# Patient Record
Sex: Female | Born: 1980
Health system: Southern US, Community
[De-identification: ages and names within clinical notes are randomized; demographics above are authoritative.]

## PROBLEM LIST (undated history)

## (undated) VITALS — BP 104/66 | HR 67 | Temp 99.0°F | Resp 16 | Ht 64.0 in | Wt 141.0 lb

## (undated) DIAGNOSIS — IMO0002 Reserved for concepts with insufficient information to code with codable children: Secondary | ICD-10-CM

## (undated) DIAGNOSIS — R011 Cardiac murmur, unspecified: Secondary | ICD-10-CM

## (undated) DIAGNOSIS — R87612 Low grade squamous intraepithelial lesion on cytologic smear of cervix (LGSIL): Secondary | ICD-10-CM

## (undated) DIAGNOSIS — A6000 Herpesviral infection of urogenital system, unspecified: Secondary | ICD-10-CM

## (undated) DIAGNOSIS — T7840XA Allergy, unspecified, initial encounter: Secondary | ICD-10-CM

## (undated) DIAGNOSIS — F419 Anxiety disorder, unspecified: Secondary | ICD-10-CM

## (undated) DIAGNOSIS — F329 Major depressive disorder, single episode, unspecified: Secondary | ICD-10-CM

## (undated) DIAGNOSIS — D259 Leiomyoma of uterus, unspecified: Secondary | ICD-10-CM

## (undated) DIAGNOSIS — F32A Depression, unspecified: Secondary | ICD-10-CM

## (undated) HISTORY — DX: Allergy, unspecified, initial encounter: T78.40XA

## (undated) HISTORY — DX: Herpesviral infection of urogenital system, unspecified: A60.00

## (undated) HISTORY — DX: Low grade squamous intraepithelial lesion on cytologic smear of cervix (LGSIL): R87.612

## (undated) HISTORY — DX: Leiomyoma of uterus, unspecified: D25.9

## (undated) HISTORY — DX: Cardiac murmur, unspecified: R01.1

## (undated) HISTORY — DX: Reserved for concepts with insufficient information to code with codable children: IMO0002

---

## 2007-05-14 HISTORY — PX: OTHER SURGICAL HISTORY: SHX169

## 2011-08-20 ENCOUNTER — Ambulatory Visit (INDEPENDENT_AMBULATORY_CARE_PROVIDER_SITE_OTHER): Payer: 59 | Admitting: Physician Assistant

## 2011-08-20 VITALS — BP 116/76 | HR 67 | Temp 98.6°F | Resp 16 | Ht 64.5 in | Wt 147.2 lb

## 2011-08-20 DIAGNOSIS — R609 Edema, unspecified: Secondary | ICD-10-CM

## 2011-08-20 DIAGNOSIS — R739 Hyperglycemia, unspecified: Secondary | ICD-10-CM

## 2011-08-20 DIAGNOSIS — Z0000 Encounter for general adult medical examination without abnormal findings: Secondary | ICD-10-CM

## 2011-08-20 DIAGNOSIS — R600 Localized edema: Secondary | ICD-10-CM

## 2011-08-20 DIAGNOSIS — R42 Dizziness and giddiness: Secondary | ICD-10-CM

## 2011-08-20 LAB — POCT URINALYSIS DIPSTICK
Bilirubin, UA: NEGATIVE
Blood, UA: NEGATIVE
Nitrite, UA: NEGATIVE
Spec Grav, UA: 1.02
pH, UA: 7.5

## 2011-08-20 LAB — POCT CBC
HCT, POC: 36.4 % — AB (ref 37.7–47.9)
Hemoglobin: 12.5 g/dL (ref 12.2–16.2)
Lymph, poc: 2.2 (ref 0.6–3.4)
MCHC: 34.3 g/dL (ref 31.8–35.4)
MCV: 97.7 fL — AB (ref 80–97)
POC LYMPH PERCENT: 41.8 %L (ref 10–50)
RDW, POC: 13 %

## 2011-08-20 LAB — POCT UA - MICROSCOPIC ONLY
Casts, Ur, LPF, POC: NEGATIVE
Mucus, UA: NEGATIVE
Yeast, UA: NEGATIVE

## 2011-08-20 LAB — COMPREHENSIVE METABOLIC PANEL
Albumin: 4.2 g/dL (ref 3.5–5.2)
Alkaline Phosphatase: 51 U/L (ref 39–117)
BUN: 10 mg/dL (ref 6–23)
CO2: 25 mEq/L (ref 19–32)
Calcium: 9.1 mg/dL (ref 8.4–10.5)
Glucose, Bld: 109 mg/dL — ABNORMAL HIGH (ref 70–99)
Potassium: 3.9 mEq/L (ref 3.5–5.3)
Sodium: 139 mEq/L (ref 135–145)
Total Protein: 6.5 g/dL (ref 6.0–8.3)

## 2011-08-20 LAB — POCT GLYCOSYLATED HEMOGLOBIN (HGB A1C): Hemoglobin A1C: 5.5

## 2011-08-20 NOTE — Progress Notes (Signed)
Subjective:    Patient ID: Nicole Wallace, female    DOB: [birth year], 31 y.o.   MRN: [id number]  HPI Presents with 5 days of intermittent dizziness x 5 days.  Occurs primarily with rapid position change, and is brief.  No spinning.  No associated visual changes, nausea, loss of sensation.  No syncope.  No GU/GI symptoms, no URI-type symptoms.  No recent illness.  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    Recently moved to [place] from [place].  Her father is here.  Her husband of 3 years remains in [place] where he has several children from a previous relationship.  There is significant strain on the marriage. However, the anxiety and stress she was experiencing in [place] have resolved.  She no longer need citalopram and has had a reduction in the frequency of her migraine HA.     Review of Systems As above.    Objective:   Physical Exam  Vital signs noted. No orthostasis. Well-developed, well nourished BF who is awake, alert and oriented, in NAD. HEENT: [place]/AT, PERRL, EOMI.  Sclera and conjunctiva are clear.  Funduscopic exam is normal. EAC are patent, TMs are normal in appearance. Nasal mucosa is pink and moist. OP is clear. Neck: supple, non-tender, no lymphadenopathey, thyromegaly. Heart: RRR, no murmur Lungs: CTA Extremities: no cyanosis, clubbing or edema. Skin: warm and dry without rash.  Results for orders placed in visit on 08/20/11  POCT CBC      Component Value Range   WBC 5.3  4.6 - 10.2 (K/uL)   Lymph, poc 2.2  0.6 - 3.4    POC LYMPH PERCENT 41.8  10 - 50 (%L)   MID (cbc) 0.4  0 - 0.9    POC MID % 7.6  0 - 12 (%M)   POC Granulocyte 2.7  2 - 6.9    Granulocyte percent 50.6  37 - 80 (%G)   RBC 3.73 (*) 4.04 - 5.48 (M/uL)   Hemoglobin 12.5  12.2 - 16.2 (g/dL)   HCT, POC 40.9 (*) 81.1 - 47.9 (%)   MCV 97.7 (*) 80 - 97 (fL)   MCH, POC 33.5 (*) 27 - 31.2 (pg)   MCHC 34.3  31.8 -  35.4 (g/dL)   RDW, POC 91.4     Platelet Count, POC 244  142 - 424 (K/uL)   MPV 9.9  0 - 99.8 (fL)  GLUCOSE, POCT (MANUAL RESULT ENTRY)      Component Value Range   POC Glucose 110    POCT UA - MICROSCOPIC ONLY      Component Value Range   WBC, Ur, HPF, POC 1-3     RBC, urine, microscopic 0-1     Bacteria, U Microscopic trace     Mucus, UA neg     Epithelial cells, urine per micros 0-1     Crystals, Ur, HPF, POC neg     Casts, Ur, LPF, POC neg     Yeast, UA neg    POCT URINALYSIS DIPSTICK      Component Value Range   Color, UA yellow     Clarity, UA clear     Glucose, UA neg     Bilirubin, UA neg     Ketones, UA trace     Spec Grav, UA 1.020     Blood, UA neg     pH, UA  7.5     Protein, UA trace     Urobilinogen, UA 1.0     Nitrite, UA neg     Leukocytes, UA Negative    POCT GLYCOSYLATED HEMOGLOBIN (HGB A1C)      Component Value Range   Hemoglobin A1C 5.5          Assessment & Plan:   1. Dizziness - light-headed  POCT CBC, POCT glucose (manual entry), POCT UA - Microscopic Only, POCT urinalysis dipstick, TSH, Comprehensive metabolic panel; suspect marital stress is ultimate cause.  Name and number for [facility] provided for counselling.  2. Leg edema  POCT UA - Microscopic Only, POCT urinalysis dipstick, TSH  3. Routine health maintenance  Ambulatory referral to Obstetrics / Gynecology  4. Hyperglycemia  HgB A1c. No evidence of diabetes, patient is non-fasting.

## 2011-08-20 NOTE — Patient Instructions (Signed)
Low impact exercise can help, but please take it easy while we're waiting on the remaining lab results.

## 2011-08-22 MED ORDER — CITALOPRAM HYDROBROMIDE 10 MG PO TABS: 10.0000 mg | ORAL_TABLET | Freq: Every day | ORAL | Status: DC

## 2011-08-22 NOTE — Progress Notes (Signed)
Addended by: Fernande Bras on: 08/22/2011 01:43 PM   Modules accepted: Orders

## 2011-09-03 ENCOUNTER — Other Ambulatory Visit: Payer: Self-pay | Admitting: Physician Assistant

## 2011-09-03 ENCOUNTER — Encounter: Payer: Self-pay | Admitting: Physician Assistant

## 2011-09-03 MED ORDER — FLUCONAZOLE 150 MG PO TABS: 150.0000 mg | ORAL_TABLET | Freq: Once | ORAL | Status: AC

## 2012-01-30 ENCOUNTER — Telehealth: Payer: Self-pay

## 2012-01-30 MED ORDER — TRAMADOL HCL 50 MG PO TABS: 50.0000 mg | ORAL_TABLET | Freq: Four times a day (QID) | ORAL | Status: DC | PRN

## 2012-01-30 MED ORDER — NORGESTIMATE-ETH ESTRADIOL 0.25-35 MG-MCG PO TABS: 1.0000 | ORAL_TABLET | Freq: Every day | ORAL | Status: DC

## 2012-01-30 NOTE — Telephone Encounter (Signed)
Nicole Wallace is requesting RFs on her BCPs and Tramadol (that she takes as needed for her migraines). She hadn't needed these at OV w/[name] so it was not done then. [name], do you want to RF these?

## 2012-01-30 NOTE — Telephone Encounter (Signed)
I've refilled both. I referred her to OBGYN when I saw her, so please remind her to have her exam in the next few months!!!

## 2012-01-31 NOTE — Telephone Encounter (Signed)
I called patient to advise.  

## 2012-04-13 ENCOUNTER — Ambulatory Visit (INDEPENDENT_AMBULATORY_CARE_PROVIDER_SITE_OTHER): Payer: 59 | Admitting: Physician Assistant

## 2012-04-13 VITALS — BP 128/86 | HR 82 | Temp 98.8°F | Resp 16 | Ht 64.5 in | Wt 156.0 lb

## 2012-04-13 DIAGNOSIS — G43909 Migraine, unspecified, not intractable, without status migrainosus: Secondary | ICD-10-CM | POA: Insufficient documentation

## 2012-04-13 DIAGNOSIS — F419 Anxiety disorder, unspecified: Secondary | ICD-10-CM | POA: Insufficient documentation

## 2012-04-13 DIAGNOSIS — D219 Benign neoplasm of connective and other soft tissue, unspecified: Secondary | ICD-10-CM | POA: Insufficient documentation

## 2012-04-13 DIAGNOSIS — J029 Acute pharyngitis, unspecified: Secondary | ICD-10-CM

## 2012-04-13 LAB — POCT RAPID STREP A (OFFICE): Rapid Strep A Screen: NEGATIVE

## 2012-04-13 MED ORDER — GUAIFENESIN ER 1200 MG PO TB12: 1.0000 | ORAL_TABLET | Freq: Two times a day (BID) | ORAL | Status: DC | PRN

## 2012-04-13 MED ORDER — IPRATROPIUM BROMIDE 0.03 % NA SOLN: 2.0000 | Freq: Two times a day (BID) | NASAL | Status: DC

## 2012-04-13 MED ORDER — FIRST-DUKES MOUTHWASH MT SUSP: 5.0000 mL | OROMUCOSAL | Status: DC | PRN

## 2012-04-13 NOTE — Patient Instructions (Signed)
Get plenty of rest and drink at least 64 ounces of water daily. 

## 2012-04-13 NOTE — Progress Notes (Signed)
Subjective:    Patient ID: Nicole Wallace, female    DOB: [birth year], 31 y.o.   MRN: [id number]  HPI This 31 y.o. female presents for evaluation of illness.  Awoke several days ago with throat pain.  Worse with swallowing, sneezing.  Worse with hot food/drink.  Feels like something stuck in her throat.  Took a photo of her throat, looks like some "red stuff."  Uvula is swollen. Minimal cough.  No nasal congestion.   Has begun to develop left ear pain. Subjective chills. No nausea, diarrhea.  Feels "Exhausted."  Her employer told her not to work today due to her illness.  Past Medical History  Diagnosis Date  . Anemia   . Migraine   . Fibroid uterus   . LSIL (low grade squamous intraepithelial lesion) on Pap smear 02/2010    colposcopy 03/2010, +HPV; normal pap 04/15/2011  . Duodenitis     mild    History reviewed. No pertinent past surgical history.  Prior to Admission medications   Medication Sig Start Date End Date Taking? Authorizing Provider  citalopram (CELEXA) 10 MG tablet Take 1 tablet (10 mg total) by mouth daily. 08/22/11  Yes Nicole Herbig S Kiely Cousar, PA-C  norgestimate-ethinyl estradiol (ORTHO-CYCLEN,SPRINTEC,PREVIFEM) 0.25-35 MG-MCG tablet Take 1 tablet by mouth daily. 01/30/12  Yes Nicole Cerino S Guerry Covington, PA-C  traMADol (ULTRAM) 50 MG tablet Take 1 tablet (50 mg total) by mouth every 6 (six) hours as needed. 01/30/12  Yes Nicole Norsworthy S Varshini Arrants, PA-C    No Known Allergies  History   Social History  . Marital Status: Married    Spouse Name: Nicole Wallace    Number of Children: 0  . Years of Education: 15   Occupational History  . Precertification Coordinator      GI   Social History Main Topics  . Smoking status: Never Smoker   . Smokeless tobacco: Never Used  . Alcohol Use: 0.6 oz/week    1 Glasses of wine per week  . Drug Use: No  . Sexually Active: No   Other Topics Concern  . Not on file   Social History Narrative   Separated from husband since 06/2011  (he lives in [place], [place]).  She lives with her father here in [place].    Family History  Problem Relation Age of Onset  . Asthma Mother   . Kidney disease Father     benign renal tumor, s/p nephrectomy  . Hypertension Father   . Hypertension Sister     cardiomegaly  . Hyperlipidemia Sister   . Asthma Sister   . Scoliosis Sister   . Fibromyalgia Sister     Review of Systems As above.    Objective:   Physical Exam Blood pressure 128/86, pulse 82, temperature 98.8 F (37.1 C), temperature source Oral, resp. rate 16, height 5' 4.5" (1.638 m), weight 156 lb (70.761 kg), last menstrual period 04/07/2012. Body mass index is 26.36 kg/(m^2). Well-developed, well nourished BF who is awake, alert and oriented, in NAD. HEENT: [place]/AT, PERRL, EOMI.  Sclera and conjunctiva are clear.  EAC are patent, TMs are normal in appearance. Nasal mucosa is congested, pink and moist. OP with minimal uvula edema, mild erythema of the anterior columns, otherwise clear. Neck: supple, non-tender, no lymphadenopathy, thyromegaly. Heart: RRR, no murmur Lungs: normal effort, CTA Extremities: no cyanosis, clubbing or edema. Skin: warm and dry without rash. Psychologic: good mood and appropriate affect, normal speech and behavior.  Results for orders placed in visit on 04/13/12  POCT  RAPID STREP A (OFFICE)      Component Value Range   Rapid Strep A Screen Negative  Negative      Assessment & Plan:   1. Acute pharyngitis  POCT rapid strep A, Culture, Group A Strep, ipratropium (ATROVENT) 0.03 % nasal spray, Guaifenesin (MUCINEX MAXIMUM STRENGTH) 1200 MG TB12, Diphenhyd-Hydrocort-Nystatin (FIRST-DUKES MOUTHWASH) SUSP   Supportive care, RTC if symptoms worsen/persist.

## 2012-04-15 LAB — CULTURE, GROUP A STREP: Organism ID, Bacteria: NORMAL

## 2012-07-16 ENCOUNTER — Ambulatory Visit (INDEPENDENT_AMBULATORY_CARE_PROVIDER_SITE_OTHER): Payer: 59 | Admitting: Physician Assistant

## 2012-07-16 ENCOUNTER — Encounter: Payer: Self-pay | Admitting: Physician Assistant

## 2012-07-16 VITALS — BP 102/70 | HR 66 | Temp 98.8°F | Resp 16 | Ht 64.5 in | Wt 151.0 lb

## 2012-07-16 LAB — CBC WITH DIFFERENTIAL/PLATELET
Basophils Absolute: 0 10*3/uL (ref 0.0–0.1)
Basophils Relative: 1 % (ref 0–1)
Eosinophils Relative: 2 % (ref 0–5)
Lymphocytes Relative: 50 % — ABNORMAL HIGH (ref 12–46)
MCHC: 34.9 g/dL (ref 30.0–36.0)
MCV: 94.5 fL (ref 78.0–100.0)
Platelets: 247 10*3/uL (ref 150–400)
RDW: 12.5 % (ref 11.5–15.5)
WBC: 4.6 10*3/uL (ref 4.0–10.5)

## 2012-07-16 LAB — COMPREHENSIVE METABOLIC PANEL
ALT: 18 U/L (ref 0–35)
AST: 22 U/L (ref 0–37)
Alkaline Phosphatase: 39 U/L (ref 39–117)
BUN: 5 mg/dL — ABNORMAL LOW (ref 6–23)
Calcium: 9.8 mg/dL (ref 8.4–10.5)
Chloride: 103 mEq/L (ref 96–112)
Creat: 0.66 mg/dL (ref 0.50–1.10)
Total Bilirubin: 0.5 mg/dL (ref 0.3–1.2)

## 2012-07-16 LAB — POCT URINALYSIS DIPSTICK
Glucose, UA: NEGATIVE
Leukocytes, UA: NEGATIVE
Nitrite, UA: NEGATIVE
Protein, UA: NEGATIVE
Urobilinogen, UA: 0.2

## 2012-07-16 LAB — LIPID PANEL
HDL: 63 mg/dL (ref >39)
LDL Cholesterol: 105 mg/dL — ABNORMAL HIGH (ref 0–99)
Total CHOL/HDL Ratio: 2.9 Ratio
VLDL: 14 mg/dL (ref 0–40)

## 2012-07-16 LAB — TSH: TSH: 1.471 u[IU]/mL (ref 0.350–4.500)

## 2012-07-16 LAB — POCT UA - MICROSCOPIC ONLY
Casts, Ur, LPF, POC: NEGATIVE
Crystals, Ur, HPF, POC: NEGATIVE

## 2012-07-16 NOTE — Progress Notes (Signed)
Subjective:    Patient ID: Nicole Wallace, female    DOB: 11/11/1980, 32 y.o.   MRN: [id number]  HPI  A 32 year old female presents for CPE without a papsmear.  Pt has no complaints or concerns today.  Admits to migraines once a month that are well controlled with Tramadol, nonpainful hemorrhoids, and hx fibroids.  She denies CP, SOB, palpitations, hematuria, dysuria, vaginal discharge, f/c, n/v, blood in her stool, abdominal pain.   Pt is sexually active and has had 2 partners in the last 6 months.  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  Pt takes ortho-cyclen consistently and has monthly menstruation.  Pt will get papsmear, breast exam, and STD testing when she goes for her annual pelvic to [facility].  LMP 07/02/2012.   Past Medical History  Diagnosis Date  . Migraine   . Fibroid uterus   . LSIL (low grade squamous intraepithelial lesion) on Pap smear 02/2010    colposcopy 03/2010, +HPV; normal pap 04/15/2011  . Duodenitis     mild  . Allergy   . Heart murmur     Past Surgical History  Procedure Laterality Date  . Colonic polyp removed   05/14/2007    non-malignant    Prior to Admission medications   Medication Sig Start Date End Date Taking? Authorizing Provider  Multiple Vitamin (MULTIVITAMIN) tablet Take 1 tablet by mouth daily.   Yes Historical Provider, MD  norgestimate-ethinyl estradiol (ORTHO-CYCLEN,SPRINTEC,PREVIFEM) 0.25-35 MG-MCG tablet Take 1 tablet by mouth daily. 01/30/12  Yes [name] S Jeffery, PA-C  traMADol (ULTRAM) 50 MG tablet Take 1 tablet (50 mg total) by mouth every 6 (six) hours as needed. 01/30/12  Yes [name] S Jeffery, PA-C    No Known Allergies  History   Social History  . Marital Status: Married    Spouse Name: N/A    Number of Children: 0  . Years of Education: 15   Occupational History  . 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 Smoking status: Never Smoker   . Smokeless tobacco: Never  Used  . Alcohol Use: 0.6 oz/week    1 Glasses of wine per week  . Drug Use: No  . Sexually Active: Yes -- Female partner(s)    Birth Control/ Protection: Pill     Comment: 2 partners in past 6 months   Other Topics Concern  . Not on file   Social History Narrative   Separated from husband since 06/2011 (he lives in [place], [place]).  She lives with her father here in [place].    Family History  Problem Relation Age of Onset  . Asthma Mother   . Kidney disease Father     benign renal tumor, s/p nephrectomy  . Hypertension Father   . Hypertension Sister     cardiomegaly  . Hyperlipidemia Sister   . Asthma Sister   . Scoliosis Sister   . Fibromyalgia Sister   . Arthritis Maternal Grandmother   . Heart disease Maternal Grandmother   . Kidney disease Maternal Grandmother   . Hyperlipidemia Maternal Grandmother   . Heart disease Maternal Grandfather   . Hypertension Maternal Grandfather   . Hyperlipidemia Maternal Grandfather   . Cancer Paternal Grandmother   . Aneurysm Paternal Grandmother       Review of Systems  All other systems reviewed and are negative.      Objective:   Physical Exam  BP 102/70  Pulse 66  Temp(Src) 98.8 F (37.1 C)  Resp 16  Ht 5' 4.5" (1.638 m)  Wt 151 lb (68.493 kg)  BMI 25.53 kg/m2  LMP 07/02/2012  General:  Pleasant, well-nourished female.  NAD. HEENT:  Pharynx pink and moist.  No erythema or bulging of TMs, landmarks visible.  Edema of turbinates.  PERRLA.  EOMs intact. Neck:  Supple.  No thyromegaly or lymphadenopathy. Skin:  Tattoo thoracic area.  Warm and moist.  Peripheral vascular:  Bilateral 2+ dorsalis pedis pulses.  No c/c/e. MSK:  Full ROM cervical/thoracic/lumbar spine and all joints.  5/5 muscular strength. 2+ DTRs BR, biceps, patellar, achilles. Abdomen:  Normal bowel sounds.  Soft, nontender. Lungs:  CTAB.  No wheeze or rhonchi. Heart:  Grade I systolic murmur.  No g/r.  RRR. Neuro:  A&Ox3.  Cranial nerves intact.   No n/t. Psych:  Normal mood and affect.    Results for orders placed in visit on 07/16/12  POCT UA - MICROSCOPIC ONLY      Result Value Range   WBC, Ur, HPF, POC 0-1     RBC, urine, microscopic neg     Bacteria, U Microscopic neg     Mucus, UA neg     Epithelial cells, urine per micros 0-4     Crystals, Ur, HPF, POC neg     Casts, Ur, LPF, POC neg     Yeast, UA neg    POCT URINALYSIS DIPSTICK      Result Value Range   Color, UA yellow     Clarity, UA clear     Glucose, UA neg     Bilirubin, UA neg     Ketones, UA neg     Spec Grav, UA 1.010     Blood, UA neg     pH, UA 7.0     Protein, UA neg     Urobilinogen, UA 0.2     Nitrite, UA neg     Leukocytes, UA Negative         Assessment & Plan:  Routine general medical examination at a health care facility - Plan: POCT UA - Microscopic Only, POCT urinalysis dipstick, CBC with Differential, Comprehensive metabolic panel, Lipid panel, TSH  Patient Instructions  Keeping You Healthy  Get These Tests 1. Blood Pressure- Have your blood pressure checked once a year by your health care provider.  Normal blood pressure is 120/80. 2. Weight- Have your body mass index (BMI) calculated to screen for obesity.  BMI is measure of body fat based on height and weight.  You can also calculate your own BMI at https://www.west-esparza.com/. 3. Cholesterol- Have your cholesterol checked every 5 years starting at age 15 then yearly starting at age 44. 4. Chlamydia, HIV, and other sexually transmitted diseases- Get screened every year until age 58, then within three months of each new sexual provider. 5. Pap Smear- Every 1-3 years; discuss with your health care provider. 6. Mammogram- Every year starting at age 85  Take these medicines  Calcium with Vitamin D-Your body needs 1200 mg of Calcium each day and [phone] IU of Vitamin D daily.  Your body can only absorb 500 mg of Calcium at a time so Calcium must be taken in 2 or 3 divided doses  throughout the day.  Multivitamin with folic acid- Once daily if it is possible for you to become pregnant.  Get these Immunizations  Gardasil-Series of three doses; prevents HPV related illness such as genital warts and cervical cancer.  Menactra-Single dose;  prevents meningitis.  Tetanus shot- Every 10 years.  Flu shot-Every year.  Take these steps 1. Do not smoke-Your healthcare provider can help you quit.  For tips on how to quit go to www.smokefree.gov or call 1-800 QUITNOW. 2. Be physically active- Exercise 5 days a week for at least 30 minutes.  If you are not already physically active, start slow and gradually work up to 30 minutes of moderate physical activity.  Examples of moderate activity include walking briskly, dancing, swimming, bicycling, etc. 3. Breast Cancer- A self breast exam every month is important for early detection of breast cancer.  For more information and instruction on self breast exams, ask your healthcare provider or [place].es. 4. Eat a healthy diet- Eat a variety of healthy foods such as fruits, vegetables, whole grains, low fat milk, low fat cheeses, yogurt, lean meats, poultry and fish, beans, nuts, tofu, etc.  For more information go to www. Thenutritionsource.org 5. Drink alcohol in moderation- Limit alcohol intake to one drink or less per day. Never drink and drive. 6. Depression- Your emotional health is as important as your physical health.  If you're feeling down or losing interest in things you normally enjoy please talk to your healthcare provider about being screened for depression. 7. Dental visit- Brush and floss your teeth twice daily; visit your dentist twice a year. 8. Eye doctor- Get an eye exam at least every 2 years. 9. Helmet use- Always wear a helmet when riding a bicycle, motorcycle, rollerblading or skateboarding. 10. Safe sex- If you may be exposed to sexually transmitted infections, use a  condom. 11. Seat belts- Seat belts can save your live; always wear one. 12. Smoke/Carbon Monoxide detectors- These detectors need to be installed on the appropriate level of your home. Replace batteries at least once a year. 13. Skin cancer- When out in the sun please cover up and use sunscreen 15 SPF or higher. 14. Violence- If anyone is threatening or hurting you, please tell your healthcare provider.

## 2012-07-16 NOTE — Progress Notes (Signed)
I have examined this patient along with the student and agree. I encourage Katelee again to establish with a neurologist locally to discuss the appropriate treatment of her migraines.  She was followed by neurology in [place] before moving to [place].  She self-d/c'd the SSRI her neurologist had initiated several months ago and reports she feels good, without anxiety or depression symptoms.  I will refill her tramadol until she can establish with a local neurologist.  Fernande Bras, PA-C Certified Physician Assistant [facility]

## 2012-07-16 NOTE — Patient Instructions (Signed)
Keeping You Healthy  Get These Tests 1. Blood Pressure- Have your blood pressure checked once a year by your health care Maliki Gignac.  Normal blood pressure is 120/80. 2. Weight- Have your body mass index (BMI) calculated to screen for obesity.  BMI is measure of body fat based on height and weight.  You can also calculate your own BMI at www.nhlbisupport.com/bmi/. 3. Cholesterol- Have your cholesterol checked every 5 years starting at age 34 then yearly starting at age 45. 4. Chlamydia, HIV, and other sexually transmitted diseases- Get screened every year until age 25, then within three months of each new sexual Graeson Nouri. 5. Pap Smear- Every 1-3 years; discuss with your health care Jakeim Sedore. 6. Mammogram- Every year starting at age 40  Take these medicines  Calcium with Vitamin D-Your body needs 1200 mg of Calcium each day and 800-1000 IU of Vitamin D daily.  Your body can only absorb 500 mg of Calcium at a time so Calcium must be taken in 2 or 3 divided doses throughout the day.  Multivitamin with folic acid- Once daily if it is possible for you to become pregnant.  Get these Immunizations  Gardasil-Series of three doses; prevents HPV related illness such as genital warts and cervical cancer.  Menactra-Single dose; prevents meningitis.  Tetanus shot- Every 10 years.  Flu shot-Every year.  Take these steps 1. Do not smoke-Your healthcare Keigo Whalley can help you quit.  For tips on how to quit go to www.smokefree.gov or call 1-800 QUITNOW. 2. Be physically active- Exercise 5 days a week for at least 30 minutes.  If you are not already physically active, start slow and gradually work up to 30 minutes of moderate physical activity.  Examples of moderate activity include walking briskly, dancing, swimming, bicycling, etc. 3. Breast Cancer- A self breast exam every month is important for early detection of breast cancer.  For more information and instruction on self breast exams, ask your  healthcare Kire Ferg or www.womenshealth.gov/faq/breast-self-exam.cfm. 4. Eat a healthy diet- Eat a variety of healthy foods such as fruits, vegetables, whole grains, low fat milk, low fat cheeses, yogurt, lean meats, poultry and fish, beans, nuts, tofu, etc.  For more information go to www. Thenutritionsource.org 5. Drink alcohol in moderation- Limit alcohol intake to one drink or less per day. Never drink and drive. 6. Depression- Your emotional health is as important as your physical health.  If you're feeling down or losing interest in things you normally enjoy please talk to your healthcare Tristin Vandeusen about being screened for depression. 7. Dental visit- Brush and floss your teeth twice daily; visit your dentist twice a year. 8. Eye doctor- Get an eye exam at least every 2 years. 9. Helmet use- Always wear a helmet when riding a bicycle, motorcycle, rollerblading or skateboarding. 10. Safe sex- If you may be exposed to sexually transmitted infections, use a condom. 11. Seat belts- Seat belts can save your live; always wear one. 12. Smoke/Carbon Monoxide detectors- These detectors need to be installed on the appropriate level of your home. Replace batteries at least once a year. 13. Skin cancer- When out in the sun please cover up and use sunscreen 15 SPF or higher. 14. Violence- If anyone is threatening or hurting you, please tell your healthcare Breahna Boylen.        

## 2012-07-17 ENCOUNTER — Encounter: Payer: Self-pay | Admitting: Physician Assistant

## 2012-07-31 ENCOUNTER — Other Ambulatory Visit: Payer: Self-pay | Admitting: Physician Assistant

## 2012-07-31 ENCOUNTER — Telehealth: Payer: Self-pay

## 2012-07-31 NOTE — Telephone Encounter (Signed)
Patient requesting a refill for her birth control.  [facility]

## 2012-07-31 NOTE — Telephone Encounter (Signed)
Patient advised this was sent in for her.  

## 2012-09-23 ENCOUNTER — Telehealth: Payer: Self-pay

## 2012-09-23 MED ORDER — NORGESTIMATE-ETH ESTRADIOL 0.25-35 MG-MCG PO TABS: 28.0000 | ORAL_TABLET | Freq: Every day | ORAL | Status: DC

## 2012-09-23 NOTE — Telephone Encounter (Signed)
Pt called in and reported that she thought [name] was going to RF her BCP for a year and give RFs of tramadol. She is completely out of BCP and just would like a couple of RFs of tramadol. She states she doesn't use it very much but can not afford to pay OOP to see the neurologist. Pt states she is going to get her PAP done by Dr Audie Box June 4. I will send in 1 RF of BCP. [name], do you want to give her more RFs of the BCPs and tramadol?

## 2012-09-25 ENCOUNTER — Other Ambulatory Visit: Payer: Self-pay | Admitting: Physician Assistant

## 2012-09-25 MED ORDER — NORGESTIMATE-ETH ESTRADIOL 0.25-35 MG-MCG PO TABS: 28.0000 | ORAL_TABLET | Freq: Every day | ORAL | Status: DC

## 2012-09-25 MED ORDER — TRAMADOL HCL 50 MG PO TABS: 50.0000 mg | ORAL_TABLET | Freq: Four times a day (QID) | ORAL | Status: DC | PRN

## 2012-09-25 NOTE — Telephone Encounter (Signed)
[name] sent these in.

## 2012-09-25 NOTE — Telephone Encounter (Signed)
Meds ordered this encounter  Medications  . DISCONTD: norgestimate-ethinyl estradiol (SPRINTEC 28) 0.25-35 MG-MCG tablet    Sig: Take 28 tablets by mouth daily.    Dispense:  28 tablet    Refill:  0  . norgestimate-ethinyl estradiol (SPRINTEC 28) 0.25-35 MG-MCG tablet    Sig: Take 28 tablets by mouth daily.    Dispense:  28 tablet    Refill:  12    Order Specific Question:  Supervising Provider    Answer:  DOOLITTLE, ROBERT P [[id number]]  . traMADol (ULTRAM) 50 MG tablet    Sig: Take 1 tablet (50 mg total) by mouth every 6 (six) hours as needed.    Dispense:  30 tablet    Refill:  1    Order Specific Question:  Supervising Provider    Answer:  DOOLITTLE, ROBERT P [[id number]]

## 2012-10-11 DIAGNOSIS — A6000 Herpesviral infection of urogenital system, unspecified: Secondary | ICD-10-CM

## 2012-10-11 HISTORY — DX: Herpesviral infection of urogenital system, unspecified: A60.00

## 2012-10-14 ENCOUNTER — Other Ambulatory Visit (HOSPITAL_COMMUNITY)
Admission: RE | Admit: 2012-10-14 | Discharge: 2012-10-14 | Disposition: A | Discharge status: Home / Self Care | Payer: 59 | Source: Ambulatory Visit | Attending: Gynecology | Admitting: Gynecology

## 2012-10-14 ENCOUNTER — Ambulatory Visit (INDEPENDENT_AMBULATORY_CARE_PROVIDER_SITE_OTHER): Payer: 59 | Admitting: Gynecology

## 2012-10-14 ENCOUNTER — Encounter: Payer: Self-pay | Admitting: Gynecology

## 2012-10-14 VITALS — BP 104/60 | Ht 64.5 in | Wt 152.0 lb

## 2012-10-14 DIAGNOSIS — Z01419 Encounter for gynecological examination (general) (routine) without abnormal findings: Secondary | ICD-10-CM

## 2012-10-14 DIAGNOSIS — D251 Intramural leiomyoma of uterus: Secondary | ICD-10-CM

## 2012-10-14 MED ORDER — NORGESTIMATE-ETH ESTRADIOL 0.25-35 MG-MCG PO TABS: 28.0000 | ORAL_TABLET | Freq: Every day | ORAL | Status: DC

## 2012-10-14 NOTE — Addendum Note (Signed)
Addended by: Richardson Chiquito on: 10/14/2012 04:30 PM   Modules accepted: Orders

## 2012-10-14 NOTE — Patient Instructions (Signed)
Follow up for ultrasound as scheduled 

## 2012-10-14 NOTE — Progress Notes (Signed)
Nicole Wallace Mar 12, 1981 [id number]        32 y.o. new patient for annual exam.  Several issues noted below.  Past medical history,surgical history, medications, allergies, family history and social history were all reviewed and documented in the EPIC chart.  ROS:  Performed and pertinent positives and negatives are included in the history, assessment and plan .  Exam: Nicole Wallace assistant Filed Vitals:   10/14/12 1405  BP: 104/60  Height: 5' 4.5" (1.638 m)  Weight: 152 lb (68.947 kg)   General appearance  Normal Skin grossly normal Head/Neck normal with no cervical or supraclavicular adenopathy thyroid normal Lungs  clear Cardiac RR, without RMG Abdominal  soft, nontender, without masses, organomegaly or hernia Breasts  examined lying and sitting without masses, retractions, discharge or axillary adenopathy. Pelvic  Ext/BUS/vagina  normal   Cervix  normal Pap/HPV  Uterus  bulky irregular consistent with leiomyoma, midline and mobile nontender   Adnexa  Without masses or tenderness    Anus and perineum  normal   Rectovaginal  normal sphincter tone without palpated masses or tenderness.    Assessment/Plan:  32 y.o. new patient for annual exam.   1. Leiomyoma. Patient has known leiomyoma for a number of years. Appeared to have remained stable on exam. Last ultrasound one year ago described 3 measuring 4.6 cm 3.8 cm and 1.8 cm. Her menses are regular and relatively light. She is not having significant dysmenorrhea or pelvic pain. She asked me her options and I reviewed the options of expectant management, medication such as continuing her low-dose birth control pills, Depo-Lupron recognizing the side effect profile and reversible regrowth issues, surgery to include myomectomy such as laparoscopic versus open recognizing the risks of the surgery to include potential scarring with infertility up to including hysterectomy if complications as well as the long-term risk of regrowth of her myoma  postoperatively, uterine artery embolization again recognizing smaller fibroids will probably not be able to be addressed and the risks of regrowth. After lengthy discussion at this point she prefer to continue as she is. I did recommend repeat ultrasound now as baseline as she is a new patient and she'll followup for this. 2. Oral contraceptives. Patient doing well on oral contraceptives. I reviewed the risk to include stroke heart attack DVT. She does not smoke and is not being followed for any medical issues. Patient wants to continue and I refilled her times a year. Currently not sexually active. 3. History of LGSIL 2011. Normal Pap smears previously and afterwards to include negative HPV screening. Pap/HPV done today. We'll plan less frequent screening interval assuming this is negative. 4. Breast health. SBE monthly reviewed. Plan mammography closer to 38. 5. Health maintenance. Patient has all of her blood work done through [facility]. Patient will followup for ultrasound otherwise annually.    Nicole Lords MD, 3:38 PM 10/14/2012

## 2012-10-22 ENCOUNTER — Emergency Department (HOSPITAL_COMMUNITY)
Admission: EM | Admit: 2012-10-22 | Discharge: 2012-10-23 | Disposition: A | Discharge status: Home / Self Care | Payer: 59 | Attending: Emergency Medicine | Admitting: Emergency Medicine

## 2012-10-22 ENCOUNTER — Encounter (HOSPITAL_COMMUNITY): Payer: Self-pay

## 2012-10-22 DIAGNOSIS — Z23 Encounter for immunization: Secondary | ICD-10-CM | POA: Insufficient documentation

## 2012-10-22 DIAGNOSIS — Z860100 Personal history of colon polyps, unspecified: Secondary | ICD-10-CM | POA: Insufficient documentation

## 2012-10-22 DIAGNOSIS — S3140XA Unspecified open wound of vagina and vulva, initial encounter: Secondary | ICD-10-CM | POA: Insufficient documentation

## 2012-10-22 DIAGNOSIS — Z113 Encounter for screening for infections with a predominantly sexual mode of transmission: Secondary | ICD-10-CM | POA: Insufficient documentation

## 2012-10-22 DIAGNOSIS — D259 Leiomyoma of uterus, unspecified: Secondary | ICD-10-CM | POA: Insufficient documentation

## 2012-10-22 DIAGNOSIS — G43909 Migraine, unspecified, not intractable, without status migrainosus: Secondary | ICD-10-CM | POA: Insufficient documentation

## 2012-10-22 DIAGNOSIS — W268XXA Contact with other sharp object(s), not elsewhere classified, initial encounter: Secondary | ICD-10-CM | POA: Insufficient documentation

## 2012-10-22 DIAGNOSIS — Y929 Unspecified place or not applicable: Secondary | ICD-10-CM | POA: Insufficient documentation

## 2012-10-22 DIAGNOSIS — K2980 Duodenitis without bleeding: Secondary | ICD-10-CM | POA: Insufficient documentation

## 2012-10-22 DIAGNOSIS — Y93E8 Activity, other personal hygiene: Secondary | ICD-10-CM | POA: Insufficient documentation

## 2012-10-22 DIAGNOSIS — S30814A Abrasion of vagina and vulva, initial encounter: Secondary | ICD-10-CM

## 2012-10-22 NOTE — ED Notes (Signed)
Pt states that she cut herself shaving on Sunday and now her lymph nodes feel swollen and the cut is not healing

## 2012-10-23 MED ORDER — CEPHALEXIN 500 MG PO CAPS: 500.0000 mg | ORAL_CAPSULE | Freq: Four times a day (QID) | ORAL | Status: DC

## 2012-10-23 MED ORDER — TETANUS-DIPHTH-ACELL PERTUSSIS 5-2.5-18.5 LF-MCG/0.5 IM SUSP
0.5000 mL | Freq: Once | INTRAMUSCULAR | Status: AC
  Administered 2012-10-23: 0.5 mL via INTRAMUSCULAR
  Filled 2012-10-23: qty 0.5

## 2012-10-23 NOTE — ED Provider Notes (Signed)
History     CSN: [id number]  Arrival date & time 10/22/12  2258   First MD Initiated Contact with Patient 10/23/12 0007      Chief Complaint  Patient presents with  . Adenopathy    (Consider location/radiation/quality/duration/timing/severity/associated sxs/prior treatment) HPI Hx per PT - R labial abrasion a week ago from shaving - still has some discomfort and is not healing. Now she has noticed lymph nodes in her groin and is worried that she has abn infection. She is sexually active, denies any known STD exposure. She is specifically worried about syphilis and is requesting a test for the same. No vag d/c. No painless ulcers. No rash, no lesions or vesicles otherwise. Current menses. No ABd pain, fevers or h/o same. She does not feel like her abrasion is getting worse, it is just not healing.  Past Medical History  Diagnosis Date  . Migraine   . Fibroid uterus   . Duodenitis     mild  . Allergy   . Heart murmur   . LSIL (low grade squamous intraepithelial lesion) on Pap smear     colposcopy 03/2010; normal pap 04/15/2011    Past Surgical History  Procedure Laterality Date  . Colonic polyp removed   05/14/2007    non-malignant    Family History  Problem Relation Age of Onset  . Asthma Mother   . Kidney disease Father     benign renal tumor, s/p nephrectomy  . Hypertension Father   . Hypertension Sister     cardiomegaly  . Hyperlipidemia Sister   . Asthma Sister   . Scoliosis Sister   . Fibromyalgia Sister   . Arthritis Maternal Grandmother   . Heart disease Maternal Grandmother   . Kidney disease Maternal Grandmother   . Hyperlipidemia Maternal Grandmother   . Heart disease Maternal Grandfather   . Hypertension Maternal Grandfather   . Hyperlipidemia Maternal Grandfather   . Cancer Paternal Grandmother     lung  . Aneurysm Paternal Grandfather     History  Substance Use Topics  . Smoking status: Never Smoker   . Smokeless tobacco: Never Used  . Alcohol  Use: 0.6 oz/week    1 Glasses of wine per week    OB History   Grav Para Term Preterm Abortions TAB SAB Ect Mult Living                  Review of Systems  Constitutional: Negative for fever and chills.  HENT: Negative for neck pain and neck stiffness.   Eyes: Negative for pain.  Respiratory: Negative for shortness of breath.   Cardiovascular: Negative for chest pain.  Gastrointestinal: Negative for abdominal pain.  Genitourinary: Negative for dysuria, vaginal discharge and pelvic pain.  Musculoskeletal: Negative for back pain.  Skin: Positive for wound. Negative for rash.  Neurological: Negative for headaches.  All other systems reviewed and are negative.    Allergies  Review of patient's allergies indicates no known allergies.  Home Medications   Current Outpatient Rx  Name  Route  Sig  Dispense  Refill  . fluconazole (DIFLUCAN) 150 MG tablet   Oral   Take 150 mg by mouth once.         . Multiple Vitamin (MULTIVITAMIN) tablet   Oral   Take 1 tablet by mouth daily.         . norgestimate-ethinyl estradiol (SPRINTEC 28) 0.25-35 MG-MCG tablet   Oral   Take 28 tablets by mouth daily.  84 tablet   3   . traMADol (ULTRAM) 50 MG tablet   Oral   Take 1 tablet (50 mg total) by mouth every 6 (six) hours as needed.   30 tablet   1   . cephALEXin (KEFLEX) 500 MG capsule   Oral   Take 1 capsule (500 mg total) by mouth 4 (four) times daily.   40 capsule   0     BP 135/80  Pulse 92  Temp(Src) 98.8 F (37.1 C) (Oral)  Resp 18  SpO2 100%  LMP 10/22/2012  Physical Exam  Constitutional: She is oriented to person, place, and time. She appears well-developed and well-nourished.  HENT:  Head: Normocephalic and atraumatic.  Eyes: EOM are normal. Pupils are equal, round, and reactive to light.  Neck: Neck supple.  Cardiovascular: Normal rate, regular rhythm and intact distal pulses.   Pulmonary/Chest: Effort normal and breath sounds normal. No respiratory  distress.  Abdominal: Soft. Bowel sounds are normal. She exhibits no distension. There is no tenderness.  Genitourinary:  Superficial labial abrasion, no erythema or swelling. No vesicles, ulcer or rash otherwise. Groin lymphadenopathy present, mildly tender. Dark blood vaginal vault c/w menses  Musculoskeletal: Normal range of motion. She exhibits no edema.  Neurological: She is alert and oriented to person, place, and time.  Skin: Skin is warm and dry.    ED Course  Procedures (including critical care time)  RPR pending - PT aware will not get results tonight and she has scheduled f/u with OB GYN and will plan to get results through her physician when in the office  1. Labial abrasion, initial encounter     MDM  Non healing labial abrasion after nicked with a razor - she also has tender lymph nodes. Abx provided. RPR sent by PT request - no painless chancre. GYN f/u.   Wound instructions/ precautions provided  VS and nursing notes reviewed and considered        Sunnie Nielsen, MD 10/23/12 [phone]

## 2012-10-25 ENCOUNTER — Encounter (HOSPITAL_COMMUNITY): Payer: Self-pay | Admitting: *Deleted

## 2012-10-25 ENCOUNTER — Emergency Department (HOSPITAL_COMMUNITY)
Admission: EM | Admit: 2012-10-25 | Discharge: 2012-10-25 | Disposition: A | Discharge status: Home / Self Care | Payer: 59 | Attending: Emergency Medicine | Admitting: Emergency Medicine

## 2012-10-25 DIAGNOSIS — B009 Herpesviral infection, unspecified: Secondary | ICD-10-CM

## 2012-10-25 DIAGNOSIS — A6000 Herpesviral infection of urogenital system, unspecified: Secondary | ICD-10-CM | POA: Insufficient documentation

## 2012-10-25 DIAGNOSIS — Z8679 Personal history of other diseases of the circulatory system: Secondary | ICD-10-CM | POA: Insufficient documentation

## 2012-10-25 DIAGNOSIS — Z8719 Personal history of other diseases of the digestive system: Secondary | ICD-10-CM | POA: Insufficient documentation

## 2012-10-25 DIAGNOSIS — Z8742 Personal history of other diseases of the female genital tract: Secondary | ICD-10-CM | POA: Insufficient documentation

## 2012-10-25 DIAGNOSIS — Z79899 Other long term (current) drug therapy: Secondary | ICD-10-CM | POA: Insufficient documentation

## 2012-10-25 MED ORDER — VALACYCLOVIR HCL 1 G PO TABS: 1000.0000 mg | ORAL_TABLET | Freq: Two times a day (BID) | ORAL | Status: AC

## 2012-10-25 NOTE — ED Provider Notes (Signed)
Medical screening examination/treatment/procedure(s) were performed by non-physician practitioner and as supervising physician I was immediately available for consultation/collaboration.  Doug Sou, MD 10/25/12 2140

## 2012-10-25 NOTE — ED Notes (Signed)
MD at bedside. 

## 2012-10-25 NOTE — ED Notes (Signed)
Pt states she cut her right labia on June 5th as she shaved.  Pt came to ED the next Wednesday and was dx with an abrasion.  Pt states she has tried various home remedies while still taking the abx prescribed by the EDP.  Pt now c/o bilateral buttock pain.  Pt also c/o vaginal pressure and labial swelling.

## 2012-10-25 NOTE — ED Provider Notes (Signed)
History     CSN: [id number]  Arrival date & time 10/25/12  1359   First MD Initiated Contact with Patient 10/25/12 1513      Chief Complaint  Patient presents with  . Pain    labia    (Consider location/radiation/quality/duration/timing/severity/associated sxs/prior treatment) HPI Comments: Pt states that she cut her labia on June 5th and was seen and treated:pt states that she has tried multiple things at home:put now she is having pain and pressure in her vaginal and buttocks region:pt denies history of stds and states that she was just recently tested:denies dysuria:pt is supposed to see her ob/gyn tomorrow  The history is provided by the patient. No language interpreter was used.    Past Medical History  Diagnosis Date  . Migraine   . Fibroid uterus   . Duodenitis     mild  . Allergy   . Heart murmur   . LSIL (low grade squamous intraepithelial lesion) on Pap smear     colposcopy 03/2010; normal pap 04/15/2011    Past Surgical History  Procedure Laterality Date  . Colonic polyp removed   05/14/2007    non-malignant    Family History  Problem Relation Age of Onset  . Asthma Mother   . Kidney disease Father     benign renal tumor, s/p nephrectomy  . Hypertension Father   . Hypertension Sister     cardiomegaly  . Hyperlipidemia Sister   . Asthma Sister   . Scoliosis Sister   . Fibromyalgia Sister   . Arthritis Maternal Grandmother   . Heart disease Maternal Grandmother   . Kidney disease Maternal Grandmother   . Hyperlipidemia Maternal Grandmother   . Heart disease Maternal Grandfather   . Hypertension Maternal Grandfather   . Hyperlipidemia Maternal Grandfather   . Cancer Paternal Grandmother     lung  . Aneurysm Paternal Grandfather     History  Substance Use Topics  . Smoking status: Never Smoker   . Smokeless tobacco: Never Used  . Alcohol Use: 0.6 oz/week    1 Glasses of wine per week    OB History   Grav Para Term Preterm Abortions TAB SAB  Ect Mult Living                  Review of Systems  Constitutional: Negative.   Respiratory: Negative.   Cardiovascular: Negative.     Allergies  Review of patient's allergies indicates no known allergies.  Home Medications   Current Outpatient Rx  Name  Route  Sig  Dispense  Refill  . Biotin 5000 MCG CAPS   Oral   Take 5,000 mcg by mouth daily.         . cephALEXin (KEFLEX) 500 MG capsule   Oral   Take 1 capsule (500 mg total) by mouth 4 (four) times daily.   40 capsule   0   . Green Tea, Camillia sinensis, (GREEN TEA PO)   Oral   Take 1 capsule by mouth daily.         . Multiple Vitamin (MULTIVITAMIN) tablet   Oral   Take 1 tablet by mouth daily.         . norgestimate-ethinyl estradiol (SPRINTEC 28) 0.25-35 MG-MCG tablet   Oral   Take 28 tablets by mouth daily.   84 tablet   3   . OVER THE COUNTER MEDICATION   Oral   Take 1 capsule by mouth daily. Digestive aid         .  traMADol (ULTRAM) 50 MG tablet   Oral   Take 1 tablet (50 mg total) by mouth every 6 (six) hours as needed.   30 tablet   1   . vitamin B-12 (CYANOCOBALAMIN) 1000 MCG tablet   Oral   Take 1,000 mcg by mouth daily.         . valACYclovir (VALTREX) 1000 MG tablet   Oral   Take 1 tablet (1,000 mg total) by mouth 2 (two) times daily.   30 tablet   0     BP 130/87  Pulse 74  Temp(Src) 99 F (37.2 C) (Oral)  Resp 20  SpO2 99%  LMP 10/22/2012  Physical Exam  Nursing note and vitals reviewed. Constitutional: She is oriented to person, place, and time. She appears well-developed and well-nourished.  Cardiovascular: Normal rate and regular rhythm.   Pulmonary/Chest: Effort normal and breath sounds normal.  Genitourinary:  Pt has multiple vesicles and indurated areas to the labia:drainage noted  Neurological: She is alert and oriented to person, place, and time.  Skin: Skin is warm.    ED Course  Procedures (including critical care time)  Labs Reviewed  HERPES  SIMPLEX VIRUS CULTURE   No results found.   1. Herpes       MDM  Pt is okay to follow up with [facility]:WRUEAVW sent:pt treated        Teressa Lower, NP 10/25/12 1704

## 2012-10-28 ENCOUNTER — Ambulatory Visit: Payer: 59

## 2012-10-28 ENCOUNTER — Encounter: Payer: Self-pay | Admitting: Gynecology

## 2012-10-28 ENCOUNTER — Ambulatory Visit (INDEPENDENT_AMBULATORY_CARE_PROVIDER_SITE_OTHER): Payer: 59 | Admitting: Gynecology

## 2012-10-28 DIAGNOSIS — B009 Herpesviral infection, unspecified: Secondary | ICD-10-CM

## 2012-10-28 LAB — HERPES SIMPLEX VIRUS CULTURE: Culture: DETECTED

## 2012-10-28 NOTE — Progress Notes (Signed)
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 She was started on Valtrex and is getting better. She did not want to have the ultrasound today though because she is still having a lot of vulvar discomfort. I discussed some general treatment options to include sitz baths with hair dryer. The options for suppressive therapy such as 500 mg Valtrex daily after her treatment course versus waiting to see if she has a recurrence and then make a decision whether to suppress her. At this point she would rather wait and she'll call me if she has any recurrence. She'll reschedule her ultrasound for a month or so from now to allow for complete healing.

## 2012-10-28 NOTE — Patient Instructions (Signed)
Followup for rescheduled ultrasound. 

## 2012-10-30 ENCOUNTER — Telehealth (HOSPITAL_COMMUNITY): Payer: Self-pay | Admitting: *Deleted

## 2012-10-30 NOTE — ED Notes (Signed)
Patient informed of positive results after id'd x 2 .  

## 2012-11-03 ENCOUNTER — Telehealth: Payer: Self-pay | Admitting: *Deleted

## 2012-11-03 MED ORDER — FLUCONAZOLE 150 MG PO TABS: 150.0000 mg | ORAL_TABLET | Freq: Once | ORAL | Status: DC

## 2012-11-03 NOTE — Telephone Encounter (Signed)
Pt called c/o possible yeast infection white discharge, itching x 2 days. Last seen in office on 10/28/12. Pt is requesting Rx. Please advise

## 2012-11-03 NOTE — Telephone Encounter (Signed)
Diflucan 150mg x 1 dose

## 2012-11-03 NOTE — Telephone Encounter (Signed)
Pt informed, rx sent 

## 2012-11-12 ENCOUNTER — Telehealth: Payer: Self-pay | Admitting: *Deleted

## 2012-11-12 MED ORDER — VALACYCLOVIR HCL 500 MG PO TABS: 500.0000 mg | ORAL_TABLET | Freq: Every day | ORAL | Status: DC

## 2012-11-12 NOTE — Telephone Encounter (Signed)
Valtrex 500 mg #30 one by mouth daily refill x5

## 2012-11-12 NOTE — Telephone Encounter (Signed)
PT INFORMED WITH THE BELOW NOTE, RX SENT.

## 2012-11-12 NOTE — Telephone Encounter (Signed)
PT IS PLANNING TO TRAVEL TO [place], AND WOULD LIKE TO HAVE A RX FOR VALTREX TO TAKE DAILY TO HAVE ON HAND IF NEEDED. OKAY TO SEND RX?

## 2012-11-25 ENCOUNTER — Ambulatory Visit (INDEPENDENT_AMBULATORY_CARE_PROVIDER_SITE_OTHER): Payer: 59

## 2012-11-25 ENCOUNTER — Other Ambulatory Visit: Payer: Self-pay | Admitting: Gynecology

## 2012-11-25 ENCOUNTER — Ambulatory Visit (INDEPENDENT_AMBULATORY_CARE_PROVIDER_SITE_OTHER): Payer: 59 | Admitting: Women's Health

## 2012-11-25 DIAGNOSIS — D252 Subserosal leiomyoma of uterus: Secondary | ICD-10-CM

## 2012-11-25 DIAGNOSIS — D259 Leiomyoma of uterus, unspecified: Secondary | ICD-10-CM

## 2012-11-25 DIAGNOSIS — N852 Hypertrophy of uterus: Secondary | ICD-10-CM

## 2012-11-25 DIAGNOSIS — D251 Intramural leiomyoma of uterus: Secondary | ICD-10-CM

## 2012-11-25 DIAGNOSIS — N8300 Follicular cyst of ovary, unspecified side: Secondary | ICD-10-CM

## 2012-11-25 DIAGNOSIS — N858 Other specified noninflammatory disorders of uterus: Secondary | ICD-10-CM

## 2012-11-25 DIAGNOSIS — N859 Noninflammatory disorder of uterus, unspecified: Secondary | ICD-10-CM

## 2012-11-25 NOTE — Progress Notes (Signed)
Patient ID: Nicole Wallace, female   DOB: [birth year], 32 y.o.   MRN: [id number] Presents for ultrasound, history of fibroids. On Sprintec with no complaints.  Ultrasound: Enlarged uterus with subserous and intramural fibroids 11 x 8 mm, 50 x 23mm, right subserous 54 x 38mm, left subserous degenerating cystic solid 51 x 40mm, fundal 18 x 14 mm. Right and left ovary normal. Follicles less than 10 mm negative cul-de-sac.   Fibroid uterus  Plan: Continue Sprintec. Not planning a pregnancy in the next year. Reviewed repeating ultrasound prior to desiring pregnancy.

## 2013-01-15 ENCOUNTER — Telehealth: Payer: Self-pay

## 2013-01-15 NOTE — Telephone Encounter (Signed)
Patient called c/o been on menses since last Thursday.  Upon questioning she is still on Sprintec.  Denies any missed or late pills prior to BTB.  She started BTB on Th and it continued Fri and on Saturday.  Saturday she took her last active pill of the pack and has since been on placebos and will restart new pack this Sunday morning.  She was concerned because started early and still bleeding.

## 2013-01-15 NOTE — Telephone Encounter (Signed)
Telephone call, states first time cycle has started early, will start new pill pack today or tomorrow. Call if continued problems with cycle.

## 2013-03-18 ENCOUNTER — Other Ambulatory Visit: Payer: Self-pay

## 2013-04-02 ENCOUNTER — Other Ambulatory Visit: Payer: Self-pay | Admitting: Physician Assistant

## 2013-04-02 MED ORDER — CITALOPRAM HYDROBROMIDE 10 MG PO TABS: 10.0000 mg | ORAL_TABLET | Freq: Every day | ORAL | Status: DC

## 2013-05-15 ENCOUNTER — Ambulatory Visit (INDEPENDENT_AMBULATORY_CARE_PROVIDER_SITE_OTHER): Payer: 59 | Admitting: Emergency Medicine

## 2013-05-15 ENCOUNTER — Ambulatory Visit: Payer: 59

## 2013-05-15 VITALS — BP 124/70 | HR 63 | Temp 98.8°F | Resp 16 | Ht 64.5 in | Wt 147.0 lb

## 2013-05-15 DIAGNOSIS — R059 Cough, unspecified: Secondary | ICD-10-CM

## 2013-05-15 DIAGNOSIS — R05 Cough: Secondary | ICD-10-CM

## 2013-05-15 DIAGNOSIS — L282 Other prurigo: Secondary | ICD-10-CM

## 2013-05-15 DIAGNOSIS — R0602 Shortness of breath: Secondary | ICD-10-CM

## 2013-05-15 DIAGNOSIS — R21 Rash and other nonspecific skin eruption: Secondary | ICD-10-CM

## 2013-05-15 MED ORDER — METHYLPREDNISOLONE SODIUM SUCC 125 MG IJ SOLR
125.0000 mg | Freq: Once | INTRAMUSCULAR | Status: AC
  Administered 2013-05-15: 125 mg via INTRAMUSCULAR

## 2013-05-15 MED ORDER — METHYLPREDNISOLONE (PAK) 4 MG PO TABS: ORAL_TABLET | ORAL | Status: DC

## 2013-05-15 NOTE — Progress Notes (Signed)
   Subjective:    Patient ID: Nicole Wallace, female    DOB: 15-Nov-1980, 33 y.o.   MRN: [id number]  HPI 33 year old female presents for evaluation of several symptoms associated with possible environmental allergy exposure. She was staying at a hotel for the past 3 nights and has noticed that she has developed a dry, hacking cough associated with wheezing. Admits these symptoms were only present when they were in the hotel room. She was staying there with her boyfriend who also experienced coughing.  States she then woke up this morning at 5 a.m with a pruritic rash over her entire body.  Admits it is extremely itchy.  She has not taken any OTC medications for this.  Does have a hx of allergies to mold and cats.  Denies SOB, chest pain, lip/tongue swelling, trouble breathing, or dizziness.  Wheezing has improved since leaving the hotel.   Patient is otherwise healthy with no other concerns today.     Review of Systems  Constitutional: Negative for fever and chills.  HENT: Negative for congestion.   Respiratory: Positive for cough and wheezing (resolved). Negative for chest tightness and shortness of breath.   Cardiovascular: Negative for chest pain.  Musculoskeletal: Negative for myalgias.  Skin: Positive for rash.       Objective:   Physical Exam  Constitutional: She is oriented to person, place, and time. She appears well-developed and well-nourished.  HENT:  Head: Normocephalic and atraumatic.  Right Ear: External ear normal.  Left Ear: External ear normal.  Mouth/Throat: Oropharynx is clear and moist. No oropharyngeal exudate.  Eyes: Conjunctivae are normal.  Neck: Normal range of motion. Neck supple.  Cardiovascular: Normal rate, regular rhythm and normal heart sounds.   Pulmonary/Chest: Effort normal and breath sounds normal.  Lymphadenopathy:    She has no cervical adenopathy.  Neurological: She is alert and oriented to person, place, and time.  Skin:  Diffuse,  erythematous papular rash over entire torso, arms and legs. No vesicles, induration, warmth, or drainage noted. (see scanned pictures)  Psychiatric: She has a normal mood and affect. Her behavior is normal. Judgment and thought content normal.      [facility] reading (PRIMARY) by  Dr. Everlene Farrier as no acute cardiopulmonary abnormality. Small granulomatous calcification left lower lobe.      Assessment & Plan:  Cough - Plan: DG Chest 2 View  Shortness of breath - Plan: DG Chest 2 View  Rash and nonspecific skin eruption - Plan: methylPREDNISolone sodium succinate (SOLU-MEDROL) 125 mg/2 mL injection 125 mg  Pruritic rash - Plan: methylPREDNIsolone (MEDROL DOSPACK) 4 MG tablet Allergic rash and reactive airway secondary to environmental exposure.  Solumedrol 125 mg IM today Start Zyrtec daily in the morning and Benadryl 25-50 mg at bedtime If rash still present on Monday, start medrol dose pack RTC precautions discussed.

## 2013-05-15 NOTE — Patient Instructions (Signed)
Recommend Zyrtec daily in the morning to help with itch Benadryl 25-50 mg at bedtime If rash not resolved by Monday, start Medrol dose pack

## 2013-05-17 ENCOUNTER — Telehealth: Payer: Self-pay

## 2013-05-17 NOTE — Telephone Encounter (Signed)
Called her, advised the note ready. Have faxed for her.

## 2013-05-17 NOTE — Telephone Encounter (Signed)
PT STATES SHE WAS SEEN THE OTHER DAY AND STILL HAVE A RASH, WOULD LIKE TO HAVE A DR'S NOTE FOR TODAY AND TOMORROW, PLEASE CALL PT AT [place] IS [phone] TO HER ATTN

## 2013-05-25 ENCOUNTER — Other Ambulatory Visit: Payer: Self-pay | Admitting: Gynecology

## 2013-05-28 ENCOUNTER — Encounter: Payer: Self-pay | Admitting: Physician Assistant

## 2013-06-09 ENCOUNTER — Encounter: Payer: Self-pay | Admitting: Gynecology

## 2013-06-09 ENCOUNTER — Ambulatory Visit (INDEPENDENT_AMBULATORY_CARE_PROVIDER_SITE_OTHER): Payer: 59 | Admitting: Gynecology

## 2013-06-09 DIAGNOSIS — D251 Intramural leiomyoma of uterus: Secondary | ICD-10-CM

## 2013-06-09 DIAGNOSIS — Z309 Encounter for contraceptive management, unspecified: Secondary | ICD-10-CM

## 2013-06-09 NOTE — Patient Instructions (Addendum)
Followup for Mirena IUD placement with your menses.

## 2013-06-09 NOTE — Progress Notes (Signed)
Patient presents to discuss contraceptive options. Had been on oral contraceptives long term for years doing well. Does have multiple small myomas with bulky uterus. Light regular menses. Ultimately stopped them last week because of decreased libido and wants to discuss alternative birth control. Is without children. Wants to maintain pregnancy possibility. Options to include pill patch ring nexplanon Depo-Provera IUDs as well as sterilization up to including hysterectomy given her leiomyoma history of she is asymptomatic at this time. Obviously since she wants to maintain childbearing tubal sterilization and hysterectomy not appropriate. Suspect estrogen/progesterone-containing products will lead to decreased libido and not interested in ring or patch. Unsure whether she wants to deal with potential for irregular bleeding and weight gain possibly associated with nexplanon and Depo-Provera. Ultimately patient wants to consider Mirena IUD. I reviewed the insertional process and the risks to include infection either immediate or delayed, possible adverse effects on pregnancy possibilities in the future, uterine perforation/migration requiring surgery to remove, progesterone absorption with progesterone side effects such as breast tenderness acne headaches, failure with pregnancy all reviewed. Patient's interested in pursuing and will schedule appointment with her menses. Literature was provided.

## 2013-06-13 DIAGNOSIS — Z975 Presence of (intrauterine) contraceptive device: Secondary | ICD-10-CM | POA: Insufficient documentation

## 2013-06-14 ENCOUNTER — Telehealth: Payer: Self-pay | Admitting: Gynecology

## 2013-06-14 ENCOUNTER — Other Ambulatory Visit: Payer: Self-pay | Admitting: Gynecology

## 2013-06-14 DIAGNOSIS — Z01818 Encounter for other preprocedural examination: Secondary | ICD-10-CM

## 2013-06-14 MED ORDER — LEVONORGESTREL 20 MCG/24HR IU IUD: INTRAUTERINE_SYSTEM | Freq: Once | INTRAUTERINE | Status: DC

## 2013-06-14 NOTE — Telephone Encounter (Signed)
06/14/13-Spoke w/pt to let her know that her [employer] ins covers the Mirena & insertion at 100% for contraception. She will call first day of cycle/wl

## 2013-06-21 ENCOUNTER — Encounter: Payer: Self-pay | Admitting: Gynecology

## 2013-06-21 ENCOUNTER — Ambulatory Visit (INDEPENDENT_AMBULATORY_CARE_PROVIDER_SITE_OTHER): Payer: 59 | Admitting: Gynecology

## 2013-06-21 DIAGNOSIS — Z30431 Encounter for routine checking of intrauterine contraceptive device: Secondary | ICD-10-CM

## 2013-06-21 HISTORY — PX: INTRAUTERINE DEVICE INSERTION: SHX323

## 2013-06-21 NOTE — Patient Instructions (Signed)
Intrauterine Device Insertion   Most often, an intrauterine device (IUD) is inserted into the uterus to prevent pregnancy. There are 2 types of IUDs available:  · Copper IUD This type of IUD creates an environment that is not favorable to sperm survival. The mechanism of action of the copper IUD is not known for certain. It can stay in place for 10 years.  · Hormone IUD This type of IUD contains the hormone progestin (synthetic progesterone). The progestin thickens the cervical mucus and prevents sperm from entering the uterus, and it also thins the uterine lining. There is no evidence that the hormone IUD prevents implantation. One hormone IUD can stay in place for up to 5 years, and a different hormone IUD can stay in place for up to 3 years.  An IUD is the most cost-effective birth control if left in place for the full duration. It may be removed at any time.  LET YOUR HEALTH CARE PROVIDER KNOW ABOUT:  · Any allergies you have.  · All medicines you are taking, including vitamins, herbs, eye drops, creams, and over-the-counter medicines.  · Previous problems you or members of your family have had with the use of anesthetics.  · Any blood disorders you have.  · Previous surgeries you have had.  · Possibility of pregnancy.  · Medical conditions you have.  RISKS AND COMPLICATIONS   Generally, intrauterine device insertion is a safe procedure. However, as with any procedure, complications can occur. Possible complications include:  · Accidental puncture (perforation) of the uterus.  · Accidental placement of the IUD either in the muscle layer of the uterus (myometrium) or outside the uterus. If this happens, the IUD can be found essentially floating around the bowels and must be taken out surgically.  · The IUD may fall out of the uterus (expulsion). This is more common in women who have recently had a child.    · Pregnancy in the fallopian tube (ectopic).  · Pelvic inflammatory disease (PID), which is infection of  the uterus and fallopian tubes. The risk of PID is slightly increased in the first 20 days after the IUD is placed, but the overall risk is still very low.  BEFORE THE PROCEDURE  · Schedule the IUD insertion for when you will have your menstrual period or right after, to make sure you are not pregnant. Placement of the IUD is better tolerated shortly after a menstrual cycle.  · You may need to take tests or be examined to make sure you are not pregnant.  · You may be required to take a pregnancy test.  · You may be required to get checked for sexually transmitted infections (STIs) prior to placement. Placing an IUD in someone who has an infection can make the infection worse.  · You may be given a pain reliever to take 1 or 2 hours before the procedure.  · An exam will be performed to determine the size and position of your uterus.  · Ask your health care provider about changing or stopping your regular medicines.  PROCEDURE   · A tool (speculum) is placed in the vagina. This allows your health care provider to see the lower part of the uterus (cervix).  · The cervix is prepped with a medicine that lowers the risk of infection.  · You may be given a medicine to numb each side of the cervix (intracervical or paracervical block). This is used to block and control any discomfort with insertion.  · A tool (  uterine sound) is inserted into the uterus to determine the length of the uterine cavity and the direction the uterus may be tilted.  · A slim instrument (IUD inserter) is inserted through the cervical canal and into your uterus.  · The IUD is placed in the uterine cavity and the insertion device is removed.  · The nylon string that is attached to the IUD and used for eventual IUD removal is trimmed. It is trimmed so that it lays high in the vagina, just outside the cervix.  AFTER THE PROCEDURE  · You may have bleeding after the procedure. This is normal. It varies from light spotting for a few days to menstrual-like  bleeding.   · You may have mild cramping.  Document Released: 12/26/2010 Document Revised: 02/17/2013 Document Reviewed: 10/18/2012  [employer]® Patient Information ©2014 [employer].

## 2013-06-21 NOTE — Progress Notes (Signed)
Patient presents for Mirena IUD placement. She has read through the booklet, has no contraindications and signed the consent form. She currently is on a normal menses.  I again reviewed the insertional process with her as well as the risks to include infection, either immediate or long-term, uterine perforation or migration requiring surgery to remove, other complications such as pain, hormonal side effects, infertility and possibility of failure with subsequent pregnancy.   Exam with [name] assistant Pelvic: External BUS vagina normal. Cervix normal with menses flow. Uterus anteverted, irregular consistent with leiomyomata. Adnexa without masses or tenderness.  Procedure: The cervix was cleansed with Betadine, anterior lip grasped with a single-tooth tenaculum, the uterus was sounded and a Mirena IUD was placed according to manufacturer's recommendations without difficulty. The strings were trimmed. The patient tolerated well and will follow up in one month for a postinsertional check.  Lot number:  [id number]  Note: This document was prepared with digital dictation and possible smart phrase technology. Any transcriptional errors that result from this process are unintentional.  Anastasio Auerbach MD, 4:53 PM 06/21/2013

## 2013-07-23 ENCOUNTER — Ambulatory Visit (INDEPENDENT_AMBULATORY_CARE_PROVIDER_SITE_OTHER): Payer: 59 | Admitting: Gynecology

## 2013-07-23 ENCOUNTER — Encounter: Payer: Self-pay | Admitting: Gynecology

## 2013-07-23 DIAGNOSIS — N760 Acute vaginitis: Secondary | ICD-10-CM

## 2013-07-23 DIAGNOSIS — Z30431 Encounter for routine checking of intrauterine contraceptive device: Secondary | ICD-10-CM

## 2013-07-23 MED ORDER — FLUCONAZOLE 150 MG PO TABS: 150.0000 mg | ORAL_TABLET | Freq: Once | ORAL | Status: DC

## 2013-07-23 NOTE — Patient Instructions (Signed)
Followup for ultrasound as scheduled. Take the Diflucan pills as needed for yeast.

## 2013-07-23 NOTE — Progress Notes (Signed)
Darling Xxx-Dolezal 07/06/1980 [id number]        33 y.o. presents for IUD followup exam as well as complaining of a "yeast" infection. Notes vaginal discharge with itching and is prone to get yeast infections. Is doing well as far as he IUD is concerned without significant discomfort or bleeding.  Past medical history,surgical history, problem list, medications, allergies, family history and social history were all reviewed and documented in the EPIC chart.  Exam: Sharrie Rothman assistant General appearance  Normal External BUS vagina with cakey white discharge. Cervix normal. IUD string not visualized. 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 Adnexa without masses or tenderness.  Assessment/Plan:  33 y.o. for IUD followup. IUD string was not visualized. We'll check ultrasound for placement. We'll also give [place] followup as far as her fibroids are concerned. Discharge and symptoms consistent with yeast. Treat with Diflucan 150 mg x2 days. #5 given to have to use when necessary in the future.   Note: This document was prepared with digital dictation and possible smart phrase technology. Any transcriptional errors that result from this process are unintentional.   Anastasio Auerbach MD, 2:16 PM 07/23/2013

## 2013-08-06 ENCOUNTER — Other Ambulatory Visit: Payer: Self-pay | Admitting: Gynecology

## 2013-08-06 ENCOUNTER — Encounter: Payer: Self-pay | Admitting: Gynecology

## 2013-08-06 ENCOUNTER — Ambulatory Visit (INDEPENDENT_AMBULATORY_CARE_PROVIDER_SITE_OTHER): Payer: 59 | Admitting: Gynecology

## 2013-08-06 ENCOUNTER — Ambulatory Visit (INDEPENDENT_AMBULATORY_CARE_PROVIDER_SITE_OTHER): Payer: 59

## 2013-08-06 DIAGNOSIS — D259 Leiomyoma of uterus, unspecified: Secondary | ICD-10-CM

## 2013-08-06 DIAGNOSIS — D251 Intramural leiomyoma of uterus: Secondary | ICD-10-CM

## 2013-08-06 DIAGNOSIS — N760 Acute vaginitis: Secondary | ICD-10-CM

## 2013-08-06 DIAGNOSIS — Z30431 Encounter for routine checking of intrauterine contraceptive device: Secondary | ICD-10-CM

## 2013-08-06 NOTE — Progress Notes (Signed)
Nicole Wallace Apr 29, 1981 [id number]        33 y.o.  G0P0000 presents for ultrasound for IUD location. Doing well otherwise without pain or issues.  Past medical history,surgical history, problem list, medications, allergies, family history and social history were all reviewed and documented in the EPIC chart.  Ultrasound shows uterus with multiple myomas largest 49 mm. Overall size generous. Endometrial echo 3.6 mm. Right and left ovaries normal. Cul-de-sac negative. IUD visualized within the cavity  Assessment/Plan:  33 y.o. G0P0000 with ultrasound showing IUD within the cavity. She does have multiple myomas. Comparing to her prior ultrasound 11/2012 the sizes have not changed. Patient was asking about wanting to get rid of the larger myomas.  I reviewed the issues of multiple myomas and options to include observation, myomectomy either open or robotic, uterine artery embolization and hysterectomy. The issues of treatment conservatively with recurrence risk and possible complications from the surgeries or treatment such as uterine artery embolization were all reviewed. At this point patient desires observation which I certainly think is reasonable and we'll see how she does with the IUD. She is due for her annual exam this summer and she'll schedule that appointment and followup.   Note: This document was prepared with digital dictation and possible smart phrase technology. Any transcriptional errors that result from this process are unintentional.   Anastasio Auerbach MD, 11:39 AM 08/06/2013

## 2013-08-06 NOTE — Patient Instructions (Signed)
Followup this summer for your annual exam as do.

## 2013-08-12 ENCOUNTER — Telehealth: Payer: Self-pay

## 2013-08-12 MED ORDER — CITALOPRAM HYDROBROMIDE 20 MG PO TABS: 20.0000 mg | ORAL_TABLET | Freq: Every day | ORAL | Status: DC

## 2013-08-12 NOTE — Telephone Encounter (Signed)
Nicole Wallace, pt called and stated that you had inst'd her to double her citalopram and take 2 of the 10 mg tabs QD. She is doing that and will run out of her Rx in half the time. Pt reqs Rx to be sent for citalopram 20 mg tabs, 1 QD until she can get in for her sch visit. I didn't see these instr's, but pended for your review.

## 2013-08-12 NOTE — Telephone Encounter (Signed)
Rx sent.  I believe my instructions for the increased dose are in My Chart.

## 2013-08-13 NOTE — Telephone Encounter (Signed)
Notified pt Rx sent

## 2013-09-02 ENCOUNTER — Encounter: Payer: 59 | Admitting: Physician Assistant

## 2013-10-05 ENCOUNTER — Encounter: Payer: 59 | Admitting: Physician Assistant

## 2013-10-19 ENCOUNTER — Ambulatory Visit (INDEPENDENT_AMBULATORY_CARE_PROVIDER_SITE_OTHER): Payer: 59 | Admitting: Gynecology

## 2013-10-19 ENCOUNTER — Encounter: Payer: Self-pay | Admitting: Gynecology

## 2013-10-19 ENCOUNTER — Encounter: Payer: Self-pay | Admitting: *Deleted

## 2013-10-19 ENCOUNTER — Other Ambulatory Visit (HOSPITAL_COMMUNITY)
Admission: RE | Admit: 2013-10-19 | Discharge: 2013-10-19 | Disposition: A | Discharge status: Home / Self Care | Payer: 59 | Source: Ambulatory Visit | Attending: Gynecology | Admitting: Gynecology

## 2013-10-19 ENCOUNTER — Telehealth: Payer: Self-pay | Admitting: *Deleted

## 2013-10-19 VITALS — BP 106/70 | Ht 65.0 in | Wt 148.0 lb

## 2013-10-19 DIAGNOSIS — D251 Intramural leiomyoma of uterus: Secondary | ICD-10-CM

## 2013-10-19 DIAGNOSIS — B009 Herpesviral infection, unspecified: Secondary | ICD-10-CM

## 2013-10-19 DIAGNOSIS — A609 Anogenital herpesviral infection, unspecified: Secondary | ICD-10-CM

## 2013-10-19 DIAGNOSIS — Z30431 Encounter for routine checking of intrauterine contraceptive device: Secondary | ICD-10-CM

## 2013-10-19 DIAGNOSIS — Z01419 Encounter for gynecological examination (general) (routine) without abnormal findings: Secondary | ICD-10-CM | POA: Insufficient documentation

## 2013-10-19 MED ORDER — VALACYCLOVIR HCL 1 G PO TABS: 1000.0000 mg | ORAL_TABLET | Freq: Two times a day (BID) | ORAL | Status: DC

## 2013-10-19 MED ORDER — TRAMADOL HCL 50 MG PO TABS: 50.0000 mg | ORAL_TABLET | Freq: Four times a day (QID) | ORAL | Status: DC | PRN

## 2013-10-19 NOTE — Addendum Note (Signed)
Addended by: Nelva Nay on: 10/19/2013 02:39 PM   Modules accepted: Orders

## 2013-10-19 NOTE — Patient Instructions (Signed)
Have your fasting lab work drawn at work. Try the higher dose of Valtrex and let me know if it's working. Followup in one year for annual exam. Sooner if any issues.  You may obtain a copy of any labs that were done today by logging onto MyChart as outlined in the instructions provided with your AVS (after visit summary). The office will not call with normal lab results but certainly if there are any significant abnormalities then we will contact you.   Health Maintenance, Female A healthy lifestyle and preventative care can promote health and wellness.  Maintain regular health, dental, and eye exams.  Eat a healthy diet. Foods like vegetables, fruits, whole grains, low-fat dairy products, and lean protein foods contain the nutrients you need without too many calories. Decrease your intake of foods high in solid fats, added sugars, and salt. Get information about a proper diet from your caregiver, if necessary.  Regular physical exercise is one of the most important things you can do for your health. Most adults should get at least 150 minutes of moderate-intensity exercise (any activity that increases your heart rate and causes you to sweat) each week. In addition, most adults need muscle-strengthening exercises on 2 or more days a week.   Maintain a healthy weight. The body mass index (BMI) is a screening tool to identify possible weight problems. It provides an estimate of body fat based on height and weight. Your caregiver can help determine your BMI, and can help you achieve or maintain a healthy weight. For adults 20 years and older:  A BMI below 18.5 is considered underweight.  A BMI of 18.5 to 24.9 is normal.  A BMI of 25 to 29.9 is considered overweight.  A BMI of 30 and above is considered obese.  Maintain normal blood lipids and cholesterol by exercising and minimizing your intake of saturated fat. Eat a balanced diet with plenty of fruits and vegetables. Blood tests for lipids  and cholesterol should begin at age 19 and be repeated every 5 years. If your lipid or cholesterol levels are high, you are over 50, or you are a high risk for heart disease, you may need your cholesterol levels checked more frequently.Ongoing high lipid and cholesterol levels should be treated with medicines if diet and exercise are not effective.  If you smoke, find out from your caregiver how to quit. If you do not use tobacco, do not start.  Lung cancer screening is recommended for adults aged 68 80 years who are at high risk for developing lung cancer because of a history of smoking. Yearly low-dose computed tomography (CT) is recommended for people who have at least a 30-pack-year history of smoking and are a current smoker or have quit within the past 15 years. A pack year of smoking is smoking an average of 1 pack of cigarettes a day for 1 year (for example: 1 pack a day for 30 years or 2 packs a day for 15 years). Yearly screening should continue until the smoker has stopped smoking for at least 15 years. Yearly screening should also be stopped for people who develop a health problem that would prevent them from having lung cancer treatment.  If you are pregnant, do not drink alcohol. If you are breastfeeding, be very cautious about drinking alcohol. If you are not pregnant and choose to drink alcohol, do not exceed 1 drink per day. One drink is considered to be 12 ounces (355 mL) of beer, 5 ounces (148  mL) of wine, or 1.5 ounces (44 mL) of liquor.  Avoid use of street drugs. Do not share needles with anyone. Ask for help if you need support or instructions about stopping the use of drugs.  High blood pressure causes heart disease and increases the risk of stroke. Blood pressure should be checked at least every 1 to 2 years. Ongoing high blood pressure should be treated with medicines, if weight loss and exercise are not effective.  If you are 46 to 35 years old, ask your caregiver if you  should take aspirin to prevent strokes.  Diabetes screening involves taking a blood sample to check your fasting blood sugar level. This should be done once every 3 years, after age 61, if you are within normal weight and without risk factors for diabetes. Testing should be considered at a younger age or be carried out more frequently if you are overweight and have at least 1 risk factor for diabetes.  Breast cancer screening is essential preventative care for women. You should practice "breast self-awareness." This means understanding the normal appearance and feel of your breasts and may include breast self-examination. Any changes detected, no matter how small, should be reported to a caregiver. Women in their 89s and 30s should have a clinical breast exam (CBE) by a caregiver as part of a regular health exam every 1 to 3 years. After age 62, women should have a CBE every year. Starting at age 67, women should consider having a mammogram (breast X-ray) every year. Women who have a family history of breast cancer should talk to their caregiver about genetic screening. Women at a high risk of breast cancer should talk to their caregiver about having an MRI and a mammogram every year.  Breast cancer gene (BRCA)-related cancer risk assessment is recommended for women who have family members with BRCA-related cancers. BRCA-related cancers include breast, ovarian, tubal, and peritoneal cancers. Having family members with these cancers may be associated with an increased risk for harmful changes (mutations) in the breast cancer genes BRCA1 and BRCA2. Results of the assessment will determine the need for genetic counseling and BRCA1 and BRCA2 testing.  The Pap test is a screening test for cervical cancer. Women should have a Pap test starting at age 68. Between ages 11 and 77, Pap tests should be repeated every 2 years. Beginning at age 27, you should have a Pap test every 3 years as long as the past 3 Pap tests  have been normal. If you had a hysterectomy for a problem that was not cancer or a condition that could lead to cancer, then you no longer need Pap tests. If you are between ages 69 and 20, and you have had normal Pap tests going back 10 years, you no longer need Pap tests. If you have had past treatment for cervical cancer or a condition that could lead to cancer, you need Pap tests and screening for cancer for at least 20 years after your treatment. If Pap tests have been discontinued, risk factors (such as a new sexual partner) need to be reassessed to determine if screening should be resumed. Some women have medical problems that increase the chance of getting cervical cancer. In these cases, your caregiver may recommend more frequent screening and Pap tests.  The human papillomavirus (HPV) test is an additional test that may be used for cervical cancer screening. The HPV test looks for the virus that can cause the cell changes on the cervix. The cells  collected during the Pap test can be tested for HPV. The HPV test could be used to screen women aged 55 years and older, and should be used in women of any age who have unclear Pap test results. After the age of 12, women should have HPV testing at the same frequency as a Pap test.  Colorectal cancer can be detected and often prevented. Most routine colorectal cancer screening begins at the age of 18 and continues through age 31. However, your caregiver may recommend screening at an earlier age if you have risk factors for colon cancer. On a yearly basis, your caregiver may provide home test kits to check for hidden blood in the stool. Use of a small camera at the end of a tube, to directly examine the colon (sigmoidoscopy or colonoscopy), can detect the earliest forms of colorectal cancer. Talk to your caregiver about this at age 37, when routine screening begins. Direct examination of the colon should be repeated every 5 to 10 years through age 49, unless  early forms of pre-cancerous polyps or small growths are found.  Hepatitis C blood testing is recommended for all people born from [birth year] through 1967 and any individual with known risks for hepatitis C.  Practice safe sex. Use condoms and avoid high-risk sexual practices to reduce the spread of sexually transmitted infections (STIs). Sexually active women aged 40 and younger should be checked for Chlamydia, which is a common sexually transmitted infection. Older women with new or multiple partners should also be tested for Chlamydia. Testing for other STIs is recommended if you are sexually active and at increased risk.  Osteoporosis is a disease in which the bones lose minerals and strength with aging. This can result in serious bone fractures. The risk of osteoporosis can be identified using a bone density scan. Women ages 74 and over and women at risk for fractures or osteoporosis should discuss screening with their caregivers. Ask your caregiver whether you should be taking a calcium supplement or vitamin D to reduce the rate of osteoporosis.  Menopause can be associated with physical symptoms and risks. Hormone replacement therapy is available to decrease symptoms and risks. You should talk to your caregiver about whether hormone replacement therapy is right for you.  Use sunscreen. Apply sunscreen liberally and repeatedly throughout the day. You should seek shade when your shadow is shorter than you. Protect yourself by wearing long sleeves, pants, a wide-brimmed hat, and sunglasses year round, whenever you are outdoors.  Notify your caregiver of new moles or changes in moles, especially if there is a change in shape or color. Also notify your caregiver if a mole is larger than the size of a pencil eraser.  Stay current with your immunizations. Document Released: 11/12/2010 Document Revised: 08/24/2012 Document Reviewed: 11/12/2010 [facility] Patient Information 2014 [employer].

## 2013-10-19 NOTE — Progress Notes (Signed)
Nicole Wallace 06-Sep-1980 [id number]        32 y.o.  G0P0000 for annual exam.  Several issues noted below.  Past medical history,surgical history, problem list, medications, allergies, family history and social history were all reviewed and documented as reviewed in the EPIC chart.  ROS:  12 system ROS performed with pertinent positives and negatives included in the history, assessment and plan.  Included Systems: General, HEENT, Neck, Cardiovascular, Pulmonary, Gastrointestinal, Genitourinary, Musculoskeletal, Dermatologic, Endocrine, Hematological, Neurologic, Psychiatric Additional significant findings :  None   Exam: Nicole Wallace assistant Filed Vitals:   10/19/13 1403  BP: 106/70  Height: 5\' 5"  (1.651 m)  Weight: 148 lb (67.132 kg)   General appearance:  Normal affect, orientation and appearance. Skin: Grossly normal HEENT: Without gross lesions.  No cervical or supraclavicular adenopathy. Thyroid normal.  Lungs:  Clear without wheezing, rales or rhonchi Cardiac: RR, without RMG Abdominal:  Soft, nontender, without masses, guarding, rebound, organomegaly or hernia Breasts:  Examined lying and sitting without masses, retractions, discharge or axillary adenopathy. Pelvic:  Ext/BUS/vagina normal  Cervix normal. Pap done IUD string not visualized  Uterus nodular overall normal in size? midline mobile, nontender   Adnexa  Without masses or tenderness    Anus and perineum  Normal   Rectovaginal  Normal sphincter tone without palpated masses or tenderness.    Assessment/Plan:  33 y.o. G0P0000 female for annual exam without menses, Mirena IUD.   1. Mirena IUD 06/2013. String not visualized as in the past. Ultrasound showed intracavitary placement 07/2013. Remains without menses. 2. Leiomyoma. Patient asymptomatic without pain or bleeding. We'll continue to monitor. Size overall stable on exam. 3. 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 Will increase to 1000 mg  daily for 6 months and then back to 500 mg to see how she does assuming that this stops her outbreaks. 4. Pap smear 2014. Pap smear done today. History of LGSIL 2011. Normal Pap smears since then. Assuming this Pap smear normal then we'll go to less frequent screening interval. 5. Breast health. SBE monthly reviewed. 6. Health maintenance. Future orders for CBC comprehensive metabolic panel lipid profile placed and she is going to have this bloodwork drawn fasting at her workplace. Followup in one year, sooner as needed.   Note: This document was prepared with digital dictation and possible smart phrase technology. Any transcriptional errors that result from this process are unintentional.   Anastasio Auerbach MD, 2:23 PM 10/19/2013

## 2013-10-19 NOTE — Progress Notes (Signed)
Patient ID: Nicole Wallace, female   DOB: Feb 26, 1981, 33 y.o.   MRN: [id number]

## 2013-10-19 NOTE — Telephone Encounter (Signed)
Pt ultram 50 mg was called in from [facility] today. Rx printed at the office.

## 2013-10-20 LAB — URINALYSIS W MICROSCOPIC + REFLEX CULTURE
BILIRUBIN URINE: NEGATIVE
Bacteria, UA: NONE SEEN
Casts: NONE SEEN
Crystals: NONE SEEN
GLUCOSE, UA: NEGATIVE mg/dL
HGB URINE DIPSTICK: NEGATIVE
Ketones, ur: NEGATIVE mg/dL
Leukocytes, UA: NEGATIVE
Nitrite: NEGATIVE
PROTEIN: NEGATIVE mg/dL
SQUAMOUS EPITHELIAL / LPF: NONE SEEN
Specific Gravity, Urine: 1.009 (ref 1.005–1.030)
Urobilinogen, UA: 0.2 mg/dL (ref 0.0–1.0)
pH: 7.5 (ref 5.0–8.0)

## 2013-10-21 LAB — CYTOLOGY - PAP

## 2013-11-11 ENCOUNTER — Telehealth: Payer: Self-pay | Admitting: *Deleted

## 2013-11-11 ENCOUNTER — Other Ambulatory Visit: Payer: Self-pay | Admitting: Physician Assistant

## 2013-11-11 MED ORDER — CITALOPRAM HYDROBROMIDE 20 MG PO TABS: 20.0000 mg | ORAL_TABLET | Freq: Every day | ORAL | Status: DC

## 2013-11-11 NOTE — Telephone Encounter (Signed)
I am okay with refill Celexa 20 mg daily #30 refill x11

## 2013-11-11 NOTE — Telephone Encounter (Signed)
Pt said she takes for depression and doing well on medication. There is a note on 08/12/13 telephone encounter with PA about medication.

## 2013-11-11 NOTE — Telephone Encounter (Signed)
Pt called asking if you would be willing to refill her celexa 20 mg since she saw you last for annual 10/19/13. Please advise

## 2013-11-11 NOTE — Telephone Encounter (Signed)
I need more information as to why she is taking it, when it was started and how she's doing on it

## 2013-11-11 NOTE — Telephone Encounter (Signed)
Pt called and LM asking for RF today bf pharm closes. She didn't realize she was almost out and they won't be open. I will send in 1 mos w/note pt needs OV for more. She is overdue for f/up. Notified pt and she agreed to come in to see [name] before she needs more.

## 2013-11-11 NOTE — Telephone Encounter (Signed)
Pt informed rx sent.

## 2013-12-31 ENCOUNTER — Telehealth: Payer: Self-pay | Admitting: *Deleted

## 2013-12-31 NOTE — Telephone Encounter (Signed)
I cannot explain why some people have outbreaks despite taking Valtrex. It has to do with your immune system. If she is on the 1000 mg and notes that beginning some outbreak she can increase to one pill twice daily for several days

## 2013-12-31 NOTE — Telephone Encounter (Signed)
Pt takes valtrex 1,000 mg for HSV daily states she missed a pill and had outbreak which causes leg discomfort.Pt doesn't understand why if she missed only 1 pill if an outbreak would occur.  And has noticed more frequent outbreaks even if she doesn't miss a pill. She states having leg discomfort as well sitting or walking asked if this could be related to taking valtrex? Pt would like any recommendation

## 2013-12-31 NOTE — Telephone Encounter (Signed)
Pt informed with the below note, pt will follow up with PCP regarding leg pain

## 2014-02-28 ENCOUNTER — Telehealth: Payer: Self-pay | Admitting: *Deleted

## 2014-02-28 MED ORDER — FLUCONAZOLE 150 MG PO TABS: 150.0000 mg | ORAL_TABLET | Freq: Once | ORAL | Status: DC

## 2014-02-28 NOTE — Telephone Encounter (Signed)
Okay for Diflucan 150 mg #5 one by mouth when necessary yeast. No refill

## 2014-02-28 NOTE — Telephone Encounter (Signed)
rx sent, pt informed.  

## 2014-02-28 NOTE — Telephone Encounter (Signed)
Pt called requesting refill on Diflucan 150 # 5 tablet states taking the Valtrex 1,000 mg this occurs. Was told to call if refill needed. Please advise

## 2014-04-11 ENCOUNTER — Other Ambulatory Visit: Payer: Self-pay

## 2014-04-11 MED ORDER — AMOXICILLIN 500 MG PO CAPS: 500.0000 mg | ORAL_CAPSULE | Freq: Three times a day (TID) | ORAL | Status: DC

## 2014-04-11 MED ORDER — FLUCONAZOLE 150 MG PO TABS
150.0000 mg | ORAL_TABLET | Freq: Every day | ORAL | Status: DC
Start: 2014-04-11 — End: 2014-07-11

## 2014-04-11 MED ORDER — FLUCONAZOLE 100 MG PO TABS: 100.0000 mg | ORAL_TABLET | Freq: Every day | ORAL | Status: DC

## 2014-04-11 MED ORDER — NEOMYCIN-POLYMYXIN-HC 1 % OT SOLN: 2.0000 [drp] | Freq: Three times a day (TID) | OTIC | Status: DC

## 2014-06-29 ENCOUNTER — Other Ambulatory Visit: Payer: Self-pay | Admitting: Gynecology

## 2014-06-29 NOTE — Telephone Encounter (Signed)
Rx called in 

## 2014-07-11 ENCOUNTER — Other Ambulatory Visit: Payer: Self-pay

## 2014-07-11 MED ORDER — FLUCONAZOLE 150 MG PO TABS: 150.0000 mg | ORAL_TABLET | Freq: Every day | ORAL | Status: DC

## 2014-09-21 ENCOUNTER — Other Ambulatory Visit: Payer: Self-pay | Admitting: Gynecology

## 2014-09-21 MED ORDER — FLUCONAZOLE 150 MG PO TABS: 150.0000 mg | ORAL_TABLET | Freq: Once | ORAL | Status: DC

## 2014-10-21 ENCOUNTER — Other Ambulatory Visit: Payer: Self-pay | Admitting: Gynecology

## 2014-10-21 ENCOUNTER — Telehealth: Payer: Self-pay

## 2014-10-21 NOTE — Telephone Encounter (Signed)
Patient called stating she needed refill on her Rx and is seeing Dr. Loetta Rough on Monday.  I called her back and left message that pharmacy had sent request and it was refilled around 1:00pm today.

## 2014-10-24 ENCOUNTER — Other Ambulatory Visit: Payer: Self-pay | Admitting: Gynecology

## 2014-10-24 ENCOUNTER — Ambulatory Visit (INDEPENDENT_AMBULATORY_CARE_PROVIDER_SITE_OTHER): Payer: 59

## 2014-10-24 ENCOUNTER — Ambulatory Visit (INDEPENDENT_AMBULATORY_CARE_PROVIDER_SITE_OTHER): Payer: 59 | Admitting: Gynecology

## 2014-10-24 ENCOUNTER — Encounter: Payer: Self-pay | Admitting: Gynecology

## 2014-10-24 VITALS — BP 120/70 | Ht 65.0 in | Wt 151.0 lb

## 2014-10-24 DIAGNOSIS — Z30431 Encounter for routine checking of intrauterine contraceptive device: Secondary | ICD-10-CM

## 2014-10-24 DIAGNOSIS — N8320 Unspecified ovarian cysts: Secondary | ICD-10-CM

## 2014-10-24 DIAGNOSIS — N852 Hypertrophy of uterus: Secondary | ICD-10-CM | POA: Diagnosis not present

## 2014-10-24 DIAGNOSIS — R102 Pelvic and perineal pain unspecified side: Secondary | ICD-10-CM

## 2014-10-24 DIAGNOSIS — Z975 Presence of (intrauterine) contraceptive device: Secondary | ICD-10-CM

## 2014-10-24 DIAGNOSIS — N83201 Unspecified ovarian cyst, right side: Secondary | ICD-10-CM

## 2014-10-24 DIAGNOSIS — N838 Other noninflammatory disorders of ovary, fallopian tube and broad ligament: Secondary | ICD-10-CM | POA: Diagnosis not present

## 2014-10-24 DIAGNOSIS — D251 Intramural leiomyoma of uterus: Secondary | ICD-10-CM

## 2014-10-24 DIAGNOSIS — Z01419 Encounter for gynecological examination (general) (routine) without abnormal findings: Secondary | ICD-10-CM | POA: Diagnosis not present

## 2014-10-24 LAB — CBC WITH DIFFERENTIAL/PLATELET
BASOS ABS: 0 10*3/uL (ref 0.0–0.1)
Basophils Relative: 1 % (ref 0–1)
Eosinophils Absolute: 0.2 10*3/uL (ref 0.0–0.7)
Eosinophils Relative: 7 % — ABNORMAL HIGH (ref 0–5)
HCT: 37.7 % (ref 36.0–46.0)
Hemoglobin: 12.7 g/dL (ref 12.0–15.0)
LYMPHS PCT: 46 % (ref 12–46)
Lymphs Abs: 1.6 10*3/uL (ref 0.7–4.0)
MCH: 34 pg (ref 26.0–34.0)
MCHC: 33.7 g/dL (ref 30.0–36.0)
MCV: 101.1 fL — ABNORMAL HIGH (ref 78.0–100.0)
MPV: 10.9 fL (ref 8.6–12.4)
Monocytes Absolute: 0.2 10*3/uL (ref 0.1–1.0)
Monocytes Relative: 5 % (ref 3–12)
NEUTROS ABS: 1.4 10*3/uL — AB (ref 1.7–7.7)
NEUTROS PCT: 41 % — AB (ref 43–77)
PLATELETS: 235 10*3/uL (ref 150–400)
RBC: 3.73 MIL/uL — AB (ref 3.87–5.11)
RDW: 13.1 % (ref 11.5–15.5)
WBC: 3.5 10*3/uL — AB (ref 4.0–10.5)

## 2014-10-24 LAB — COMPREHENSIVE METABOLIC PANEL
ALBUMIN: 4.1 g/dL (ref 3.5–5.2)
ALK PHOS: 48 U/L (ref 39–117)
ALT: 16 U/L (ref 0–35)
AST: 20 U/L (ref 0–37)
BUN: 8 mg/dL (ref 6–23)
CHLORIDE: 105 meq/L (ref 96–112)
CO2: 26 meq/L (ref 19–32)
Calcium: 9.5 mg/dL (ref 8.4–10.5)
Creat: 0.66 mg/dL (ref 0.50–1.10)
GLUCOSE: 81 mg/dL (ref 70–99)
POTASSIUM: 4.1 meq/L (ref 3.5–5.3)
Sodium: 139 mEq/L (ref 135–145)
TOTAL PROTEIN: 6.6 g/dL (ref 6.0–8.3)
Total Bilirubin: 0.7 mg/dL (ref 0.2–1.2)

## 2014-10-24 LAB — LIPID PANEL
CHOLESTEROL: 163 mg/dL (ref 0–200)
HDL: 62 mg/dL (ref >=46)
LDL CALC: 92 mg/dL (ref 0–99)
TRIGLYCERIDES: 45 mg/dL (ref <150)
Total CHOL/HDL Ratio: 2.6 Ratio
VLDL: 9 mg/dL (ref 0–40)

## 2014-10-24 MED ORDER — TRAMADOL HCL 50 MG PO TABS: 50.0000 mg | ORAL_TABLET | Freq: Four times a day (QID) | ORAL | Status: DC | PRN

## 2014-10-24 MED ORDER — VALACYCLOVIR HCL 1 G PO TABS: 1000.0000 mg | ORAL_TABLET | Freq: Two times a day (BID) | ORAL | Status: DC

## 2014-10-24 NOTE — Addendum Note (Signed)
Addended by: Anastasio Auerbach on: 10/24/2014 10:49 AM   Modules accepted: Orders

## 2014-10-24 NOTE — Progress Notes (Addendum)
Nicole Wallace 22-Oct-1980 [id number]        33 y.o.  G0P0000 for annual exam.  Several issues noted below.  Past medical history,surgical history, problem list, medications, allergies, family history and social history were all reviewed and documented as reviewed in the EPIC chart.  ROS:  Performed with pertinent positives and negatives included in the history, assessment and plan.   Additional significant findings :  none   Exam: [name] Counsellor Vitals:   10/24/14 0809  BP: 120/70  Height: 5\' 5"  (1.651 m)  Weight: 151 lb (68.493 kg)   General appearance:  Normal affect, orientation and appearance. Skin: Grossly normal HEENT: Without gross lesions.  No cervical or supraclavicular adenopathy. Thyroid normal.  Lungs:  Clear without wheezing, rales or rhonchi Cardiac: RR, without RMG Abdominal:  Soft, nontender, without masses, guarding, rebound, organomegaly or hernia Breasts:  Examined lying and sitting without masses, retractions, discharge or axillary adenopathy. Pelvic:  Ext/BUS/vagina normal  Cervix normal. IUD string not visualized  Uterus 8 weeks' size with irregularity consistent with leiomyoma, midline and mobile with mild tenderness to manipulation  Adnexa  Without masses or tenderness    Anus and perineum  Normal   Rectovaginal  Normal sphincter tone without palpated masses or tenderness.    Assessment/Plan:  34 y.o. G0P0000 female for annual exam with regular menses, Mirena IUD.   1. Dyspareunia/leiomyoma.patient notes having more consistent discomfort with intercourse as if something is being hit. She is tender on exam when I push on one of her fibroids at the end of her vaginal vault. Uterus feels larger than before. Will check baseline ultrasound now.  Does have some dysmenorrhea and uses tramadol 50 mg as needed. #30 with 5 refills provided. 2. Mirena IUD 06/2013. Regular light menses. IUD string not visualized as in the past. Check placement with ultrasound  for #1 3. Pap smear 2015. No Pap smear done today.  History of LGSIL 2011 with normal Pap smears since then. Plan repeat Pap smear at 3 year interval 4. 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 She understands its higher than the recommended daily dose but when she decreases the dose she has outbreaks.  Valtrex No. 60 refill times 12 ordered. 5. SBE monthly reviewed. Screening mammogram 35-40 discussed. No family history of breast cancer. Plan mammography closer to 40. 6. Health maintenance. Has not had routine blood work done for some time. Baseline CBC, comprehensive metabolic panel, lipid profile, urinalysis ordered. Follow up for ultrasound.   Addendum: Patient had her ultrasound today which shows uterus generous in size stable from prior ultrasound 2015. Multiple myomas all measuring about the same size. Some degenerative changes in the 5 cm myoma. IUD visualized within the endometrial cavity. Endometrial echo 4.5 mm. Right ovary with thin-walled echo free avascular cyst 38 mm mean. Left ovary normal. Cul-de-sac with mild amount of fluid at 24 mm.  Assessment/plan: Reviewed ultrasound with the patient. I think her discomfort is probably from the right ovarian cyst. Will follow at present and repeat ultrasound in 2 months. Differential to include physiologic versus pathologic such as tumor reviewed. Patient's comfortable with following and will return in 2 months for the ultrasound.  Anastasio Auerbach MD, 8:34 AM 10/24/2014

## 2014-10-25 LAB — URINALYSIS W MICROSCOPIC + REFLEX CULTURE
BACTERIA UA: NONE SEEN
BILIRUBIN URINE: NEGATIVE
CRYSTALS: NONE SEEN
Casts: NONE SEEN
Glucose, UA: NEGATIVE mg/dL
Hgb urine dipstick: NEGATIVE
KETONES UR: NEGATIVE mg/dL
Leukocytes, UA: NEGATIVE
NITRITE: NEGATIVE
PROTEIN: NEGATIVE mg/dL
SPECIFIC GRAVITY, URINE: 1.011 (ref 1.005–1.030)
Squamous Epithelial / LPF: NONE SEEN
UROBILINOGEN UA: 0.2 mg/dL (ref 0.0–1.0)
pH: 6 (ref 5.0–8.0)

## 2014-10-28 ENCOUNTER — Telehealth: Payer: Self-pay | Admitting: *Deleted

## 2014-10-28 NOTE — Telephone Encounter (Signed)
(  pt aware you are out of the office) Pt called requesting lab results from visit on 10/24/14, ? About abnormal CBC regarding red blood and white blood cells. Pt asked if anything she needs to do different? Or no need to worry? Please advise

## 2014-10-31 ENCOUNTER — Encounter: Payer: 59 | Admitting: Gynecology

## 2014-10-31 NOTE — Telephone Encounter (Signed)
I don't think anything is to be done. Is not unusual to have a little bit lower white count. It's been normal before. Only other option would be to repeat her CBC if she would feel more comfortable with this in a month

## 2014-10-31 NOTE — Telephone Encounter (Signed)
Pt informed with the below, states no need to recheck CBC.

## 2014-12-08 ENCOUNTER — Other Ambulatory Visit: Payer: Self-pay | Admitting: Gynecology

## 2014-12-20 ENCOUNTER — Telehealth: Payer: Self-pay | Admitting: *Deleted

## 2014-12-20 NOTE — Telephone Encounter (Signed)
Pt called has ultrasound scheduled tomorrow to follow up ovarian cyst on left side, states nagging is becoming more frequent. Ultrasound is follow up same area, pt said discomfort is not unbearable, will take OTC ibuprofen as needed until appointment tomorrow.

## 2014-12-21 ENCOUNTER — Other Ambulatory Visit: Payer: Self-pay | Admitting: Gynecology

## 2014-12-21 ENCOUNTER — Ambulatory Visit (INDEPENDENT_AMBULATORY_CARE_PROVIDER_SITE_OTHER): Payer: 59

## 2014-12-21 ENCOUNTER — Encounter: Payer: Self-pay | Admitting: Gynecology

## 2014-12-21 ENCOUNTER — Ambulatory Visit (INDEPENDENT_AMBULATORY_CARE_PROVIDER_SITE_OTHER): Payer: 59 | Admitting: Gynecology

## 2014-12-21 VITALS — BP 120/76

## 2014-12-21 DIAGNOSIS — N8320 Unspecified ovarian cysts: Secondary | ICD-10-CM | POA: Diagnosis not present

## 2014-12-21 DIAGNOSIS — N852 Hypertrophy of uterus: Secondary | ICD-10-CM | POA: Diagnosis not present

## 2014-12-21 DIAGNOSIS — D251 Intramural leiomyoma of uterus: Secondary | ICD-10-CM

## 2014-12-21 DIAGNOSIS — N83201 Unspecified ovarian cyst, right side: Secondary | ICD-10-CM

## 2014-12-21 DIAGNOSIS — N83202 Unspecified ovarian cyst, left side: Secondary | ICD-10-CM

## 2014-12-21 NOTE — Progress Notes (Signed)
Calie Xxx-Botkin [birth year] [id number]        34 y.o.  G0P0000 Presents for follow up ultrasound with history of multiple myomas, dyspareunia and right ovarian cyst 36 mm mean. Also with IUD in place and string not visualized  Past medical history,surgical history, problem list, medications, allergies, family history and social history were all reviewed and documented in the EPIC chart.  Directed ROS with pertinent positives and negatives documented in the history of present illness/assessment and plan.  Exam: Filed Vitals:   12/21/14 1132  BP: 120/76   General appearance:  Normal  Ultrasound shows uterus with multiple myomas unchanged from prior exam. Endometrial echo 3.7 mm  Right ovary is normal with previous cyst resolved. Left ovary with thin-walled echo-free avascular cyst 50 mm mean. Cul-de-sac negative. IUD visualized within proper location.  Assessment/Plan:  34 y.o. G0P0000 with leiomyoma the largest measuring 50 mm and a new probable physiologic left ovarian cyst. Patient is very interested in doing something about her leiomyoma. I reviewed that she has multiple small myomas which are somewhat difficult to deal with versus the one larger one. Options to include hormonal suppression, Depo-Lupron, myomectomy, hysterectomy. Do not feel given the number of smaller illness that uterine artery embolization is feasible/recommended. The issues of myomectomy open versus robotic particularly for small multiple myomas and the risks of regrowth in the future discussed. She is not interested in hysterectomy at this point. She is interested in talking about robotic myomectomy and I will refer her to a robotic surgeon for this discussion. I did recommend repeat ultrasound in several months to relook at the left ovary to make sure the cyst resolves. If she does have surgery in the interim then they can assess the ovary at that time.    Anastasio Auerbach MD, 12:00 PM 12/21/2014

## 2014-12-21 NOTE — Patient Instructions (Addendum)
Robotic surgeons in [place] include :  [place] Sandra Rivard

## 2014-12-29 ENCOUNTER — Encounter: Payer: Self-pay | Admitting: Physician Assistant
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
## 2014-12-30 ENCOUNTER — Other Ambulatory Visit: Payer: Self-pay | Admitting: Gynecology

## 2014-12-30 ENCOUNTER — Ambulatory Visit (INDEPENDENT_AMBULATORY_CARE_PROVIDER_SITE_OTHER): Payer: 59 | Admitting: Physician Assistant

## 2014-12-30 VITALS — BP 120/64 | HR 56 | Temp 99.3°F | Resp 14 | Ht 65.0 in | Wt 144.0 lb

## 2014-12-30 DIAGNOSIS — G4700 Insomnia, unspecified: Secondary | ICD-10-CM

## 2014-12-30 DIAGNOSIS — F439 Reaction to severe stress, unspecified: Secondary | ICD-10-CM

## 2014-12-30 DIAGNOSIS — Z658 Other specified problems related to psychosocial circumstances: Secondary | ICD-10-CM | POA: Diagnosis not present

## 2014-12-30 MED ORDER — DIAZEPAM 5 MG PO TABS: 5.0000 mg | ORAL_TABLET | Freq: Every evening | ORAL | Status: DC | PRN
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
## 2014-12-30 NOTE — Telephone Encounter (Signed)
Called into pharmacy

## 2015-01-02 ENCOUNTER — Encounter: Payer: Self-pay | Admitting: Physician Assistant

## 2015-01-26 ENCOUNTER — Encounter: Payer: Self-pay | Admitting: Gynecology

## 2015-01-26 MED ORDER — FLUCONAZOLE 150 MG PO TABS: ORAL_TABLET | ORAL | Status: DC

## 2015-01-26 NOTE — Telephone Encounter (Signed)
Okay to refill Diflucan 150 mg #5 one by mouth when necessary for yeast

## 2015-01-31 ENCOUNTER — Encounter: Payer: Self-pay | Admitting: Physician Assistant

## 2015-01-31 ENCOUNTER — Ambulatory Visit (INDEPENDENT_AMBULATORY_CARE_PROVIDER_SITE_OTHER): Payer: 59 | Admitting: Physician Assistant

## 2015-01-31 VITALS — BP 98/72 | HR 71 | Temp 98.0°F | Resp 16 | Ht 65.0 in | Wt 141.6 lb

## 2015-01-31 DIAGNOSIS — G4700 Insomnia, unspecified: Secondary | ICD-10-CM

## 2015-01-31 DIAGNOSIS — Z658 Other specified problems related to psychosocial circumstances: Secondary | ICD-10-CM

## 2015-01-31 DIAGNOSIS — F439 Reaction to severe stress, unspecified: Secondary | ICD-10-CM

## 2015-01-31 MED ORDER — DIAZEPAM 10 MG PO TABS: 10.0000 mg | ORAL_TABLET | Freq: Every evening | ORAL | Status: DC | PRN

## 2015-01-31 MED ORDER — CITALOPRAM HYDROBROMIDE 40 MG PO TABS: 40.0000 mg | ORAL_TABLET | Freq: Every day | ORAL | Status: DC

## 2015-01-31 NOTE — Patient Instructions (Signed)
Increase the Celexa (citalopram) to 40 mg. It takes about 4 weeks to achieve the maximum effect/benefit of the higher dose.  Continue journaling and seeing your therapist. Consider asking if you can see him more frequently.

## 2015-01-31 NOTE — Progress Notes (Signed)
Patient ID: [name] Xxx-Crable, female    DOB: 10-21-1980, 34 y.o.   MRN: [id number]  PCP: Aubrionna Istre, PA-C  Subjective:   Chief Complaint  Patient presents with  . Insomnia    lack of sleep    HPI Presents for evaluation of insomnia.  Continues to have trouble sleeping. Seeing a therapist. Next appointment isn't until 10/13. Journaling. Working to be more expressive with her boyfriend, Merrily Pew. Relationship is good. Unable to fall asleep without diazepam, but it has a delayed onset by about 3-4 hours. Sometimes she needs another dose several hours into the night, but then she's over sleeping. Doesn't feel rested when she wakes. Sometimes things cross her mind, but it's really that she just isn't getting sleepy. She wakes up 1-2 times during the night, and can usually go back to sleep. On vacation, she drank more than usual, and slept well and longer. But she reports being "consciously asleep" and has no memory of activities during that time. Also reports that her eyes will be open, but that she's asleep. Currently gets 5-7 hours of sleep most nights, sometimes only 4 (per the app on her phone). "I really think it's stress." Financial stress. Concerned about the health of both her mother and her sister, neither of whom is making healthy lifestyle changes and both of whom have multiple medical problems. "I think that deep down I have the feeling of humiliation." (Josh will be going on a cruise, originally scheduled with a group that includes the mother of his child and before they started seeing each other. That makes the patient very uncomfortable, since she was unfaithful to him with a previous partner).  Is reluctant to increase the citalopram dose for fear that she may become a "zombie." She thinks that happened to a friend of hers, "but it seemed to work for her."  No thoughts of harming herself or others. Continues to get up most days about 6:30 am and running. Making lots of  healthy lifestyle changes with regard to eating and self image.    Review of Systems As above. No CP, SOB, HA, dizziness. No GI/GU symptoms.    Patient Active Problem List   Diagnosis Date Noted  . Migraine 04/13/2012  . Fibroids 04/13/2012  . Anxiety 04/13/2012     Prior to Admission medications   Medication Sig Start Date End Date Taking? Authorizing Provider  citalopram (CELEXA) 20 MG tablet TAKE 1 TABLET BY MOUTH ONCE DAILY 12/08/14  Yes Anastasio Auerbach, MD  diazepam (VALIUM) 5 MG tablet Take 1 tablet (5 mg total) by mouth at bedtime as needed for anxiety (insomnia). 12/30/14  Yes Elita Dame, PA-C  fluconazole (DIFLUCAN) 150 MG tablet one by mouth when necessary for yeast 01/26/15  Yes Anastasio Auerbach, MD  Green Tea, Camillia sinensis, (GREEN TEA PO) Take 1 capsule by mouth daily.   Yes Historical Provider, MD  Multiple Vitamin (MULTIVITAMIN) tablet Take 1 tablet by mouth daily.   Yes Historical Provider, MD  traMADol (ULTRAM) 50 MG tablet TAKE 1 TABLET BY MOUTH EVERY 6 HOURS AS NEEDED FOR PAIN 12/30/14  Yes Anastasio Auerbach, MD  valACYclovir (VALTREX) 1000 MG tablet Take 1 tablet (1,000 mg total) by mouth 2 (two) times daily. 10/24/14  Yes Anastasio Auerbach, MD  vitamin B-12 (CYANOCOBALAMIN) 1000 MCG tablet Take 1,000 mcg by mouth daily.   Yes Historical Provider, MD     Allergies  Allergen Reactions  . Wool Alcohol [Lanolin] Rash  Objective:  Physical Exam  Constitutional: She is oriented to person, place, and time. Vital signs are normal. She appears well-developed and well-nourished. She is active and cooperative. No distress.  BP 98/72 mmHg  Pulse 71  Temp(Src) 98 F (36.7 C) (Oral)  Resp 16  Ht 5\' 5"  (1.651 m)  Wt 141 lb 9.6 oz (64.229 kg)  BMI 23.56 kg/m2  SpO2 99%  LMP 01/25/2015  HENT:  Head: Normocephalic and atraumatic.  Right Ear: Hearing normal.  Left Ear: Hearing normal.  Eyes: Conjunctivae are normal. No scleral icterus.  Neck:  Normal range of motion. Neck supple. No thyromegaly present.  Cardiovascular: Normal rate, regular rhythm and normal heart sounds.   Pulses:      Radial pulses are 2+ on the right side, and 2+ on the left side.  Pulmonary/Chest: Effort normal and breath sounds normal.  Lymphadenopathy:       Head (right side): No tonsillar, no preauricular, no posterior auricular and no occipital adenopathy present.       Head (left side): No tonsillar, no preauricular, no posterior auricular and no occipital adenopathy present.    She has no cervical adenopathy.       Right: No supraclavicular adenopathy present.       Left: No supraclavicular adenopathy present.  Neurological: She is alert and oriented to person, place, and time. No sensory deficit.  Skin: Skin is warm, dry and intact. No rash noted. No cyanosis or erythema. Nails show no clubbing.  Psychiatric: She has a normal mood and affect. Her speech is normal and behavior is normal. Cognition and memory are not impaired. She does not express impulsivity. She expresses no homicidal and no suicidal ideation.           Assessment & Plan:   1. Insomnia Try increasing valium dose to 10 mg and taking the entire dose early in the evening.  - diazepam (VALIUM) 10 MG tablet; Take 1 tablet (10 mg total) by mouth at bedtime as needed for anxiety (insomnia).  Dispense: 30 tablet; Refill: 0  2. Situational stress Increase citalopram to 40 mg daily. Hopefully that will reduce the need for the Valium. Continue with therapy, perhaps more frequently. Has a lot of negative self-talk and worry that I believe is contributing to her difficulty sleeping. - citalopram (CELEXA) 40 MG tablet; Take 1 tablet (40 mg total) by mouth daily.  Dispense: 90 tablet; Refill: 3   Fara Chute, PA-C Physician Assistant-Certified [facility]

## 2015-02-12 ENCOUNTER — Inpatient Hospital Stay (HOSPITAL_COMMUNITY)
Admission: AD | Admit: 2015-02-12 | Discharge: 2015-02-17 | DRG: 885 | Disposition: A | Discharge status: Home / Self Care | Payer: 59 | Attending: Psychiatry | Admitting: Psychiatry

## 2015-02-12 ENCOUNTER — Ambulatory Visit (INDEPENDENT_AMBULATORY_CARE_PROVIDER_SITE_OTHER): Payer: 59 | Admitting: Physician Assistant

## 2015-02-12 ENCOUNTER — Telehealth: Payer: Self-pay | Admitting: Radiology

## 2015-02-12 ENCOUNTER — Encounter (HOSPITAL_COMMUNITY): Payer: Self-pay | Admitting: *Deleted

## 2015-02-12 VITALS — BP 122/78 | HR 69 | Temp 98.5°F | Resp 16 | Ht 65.0 in | Wt 142.0 lb

## 2015-02-12 DIAGNOSIS — R45851 Suicidal ideations: Secondary | ICD-10-CM | POA: Diagnosis present

## 2015-02-12 DIAGNOSIS — G4700 Insomnia, unspecified: Secondary | ICD-10-CM

## 2015-02-12 DIAGNOSIS — F332 Major depressive disorder, recurrent severe without psychotic features: Secondary | ICD-10-CM | POA: Diagnosis not present

## 2015-02-12 DIAGNOSIS — F419 Anxiety disorder, unspecified: Secondary | ICD-10-CM | POA: Diagnosis not present

## 2015-02-12 DIAGNOSIS — F329 Major depressive disorder, single episode, unspecified: Secondary | ICD-10-CM | POA: Diagnosis present

## 2015-02-12 HISTORY — DX: Major depressive disorder, single episode, unspecified: F32.9

## 2015-02-12 HISTORY — DX: Depression, unspecified: F32.A

## 2015-02-12 HISTORY — DX: Anxiety disorder, unspecified: F41.9

## 2015-02-12 LAB — COMPREHENSIVE METABOLIC PANEL
ALBUMIN: 4.8 g/dL (ref 3.5–5.0)
ALK PHOS: 53 U/L (ref 38–126)
ALT: 22 U/L (ref 14–54)
AST: 24 U/L (ref 15–41)
Anion gap: 5 (ref 5–15)
BUN: 8 mg/dL (ref 6–20)
CALCIUM: 9.4 mg/dL (ref 8.9–10.3)
CO2: 29 mmol/L (ref 22–32)
CREATININE: 0.55 mg/dL (ref 0.44–1.00)
Chloride: 106 mmol/L (ref 101–111)
GFR calc Af Amer: >60 mL/min (ref >60)
GFR calc non Af Amer: >60 mL/min (ref >60)
GLUCOSE: 108 mg/dL — AB (ref 65–99)
Potassium: 3.8 mmol/L (ref 3.5–5.1)
SODIUM: 140 mmol/L (ref 135–145)
Total Bilirubin: 0.8 mg/dL (ref 0.3–1.2)
Total Protein: 7.8 g/dL (ref 6.5–8.1)

## 2015-02-12 LAB — CBC
HCT: 38.4 % (ref 36.0–46.0)
HEMOGLOBIN: 13.3 g/dL (ref 12.0–15.0)
MCH: 34.1 pg — ABNORMAL HIGH (ref 26.0–34.0)
MCHC: 34.6 g/dL (ref 30.0–36.0)
MCV: 98.5 fL (ref 78.0–100.0)
Platelets: 217 10*3/uL (ref 150–400)
RBC: 3.9 MIL/uL (ref 3.87–5.11)
RDW: 12 % (ref 11.5–15.5)
WBC: 6.5 10*3/uL (ref 4.0–10.5)

## 2015-02-12 LAB — ETHANOL: Alcohol, Ethyl (B): <5 mg/dL (ref <5)

## 2015-02-12 LAB — TSH: TSH: 1.904 u[IU]/mL (ref 0.350–4.500)

## 2015-02-12 MED ORDER — MAGNESIUM HYDROXIDE 400 MG/5ML PO SUSP
30.0000 mL | Freq: Every day | ORAL | Status: DC | PRN
  Administered 2015-02-13 – 2015-02-16 (×2): 30 mL via ORAL
  Filled 2015-02-12 (×2): qty 30

## 2015-02-12 MED ORDER — HYDROXYZINE HCL 25 MG PO TABS
25.0000 mg | ORAL_TABLET | Freq: Three times a day (TID) | ORAL | Status: DC | PRN
  Administered 2015-02-12 – 2015-02-16 (×6): 25 mg via ORAL
  Filled 2015-02-12 (×6): qty 1

## 2015-02-12 MED ORDER — TRAZODONE HCL 50 MG PO TABS
50.0000 mg | ORAL_TABLET | Freq: Every evening | ORAL | Status: DC | PRN
  Administered 2015-02-12 – 2015-02-16 (×4): 50 mg via ORAL
  Filled 2015-02-12 (×4): qty 1

## 2015-02-12 MED ORDER — ACETAMINOPHEN 325 MG PO TABS: 650.0000 mg | ORAL_TABLET | Freq: Four times a day (QID) | ORAL | Status: DC | PRN

## 2015-02-12 MED ORDER — IBUPROFEN 400 MG PO TABS
400.0000 mg | ORAL_TABLET | Freq: Four times a day (QID) | ORAL | Status: DC | PRN
  Administered 2015-02-12 – 2015-02-17 (×2): 400 mg via ORAL
  Filled 2015-02-12 (×2): qty 1

## 2015-02-12 MED ORDER — ALUM & MAG HYDROXIDE-SIMETH 200-200-20 MG/5ML PO SUSP: 30.0000 mL | ORAL | Status: DC | PRN

## 2015-02-12 NOTE — Telephone Encounter (Signed)
Patient called states it is urgent [name] call her has left no details not other information. She is aware [name] is in clinic.
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
## 2015-02-12 NOTE — Telephone Encounter (Signed)
Before I received this message, the patient present to the front desk in tears, asking to speak with me. She is getting checked in presently for evaluation.

## 2015-02-12 NOTE — Progress Notes (Addendum)
D: Pt has anxious affect and mood.  She had a visit with her parents tonight and reports that "it was awesome."  When asked about her mood, pt looks down and states "I've been down."  Pt denies SI, stating "without my cell phone and somebody encouraging me to feel that way, I've been fine."  She denies HI, denies hallucinations, denies pain.  Pt has been visible in milieu interacting with peers and staff appropriately.  She attended evening group.   A: Introduced self to pt.  Met with pt 1:1 and provided support and encouragement.  Actively listened to pt.  PRN medication administered for anxiety and sleep.  Encouraged pt to provide urine specimen for lab.   R: Pt reports she is unable to provide urine specimen at this time.  Pt is compliant with medications.  Pt verbally contracts for safety.  Will continue to monitor and assess.   
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
## 2015-02-12 NOTE — Plan of Care (Signed)
Problem: Alteration in mood & ability to function due to Goal: STG-Patient will attend groups Outcome: Progressing Pt attended evening group on 02/12/15.

## 2015-02-12 NOTE — Progress Notes (Signed)
Adult Psychoeducational Group Note  Date:  02/12/2015 Time:  9:03 PM  Group Topic/Focus:  Wrap-Up Group:   The focus of this group is to help patients review their daily goal of treatment and discuss progress on daily workbooks.  Participation Level:  Active  Participation Quality:  Appropriate and Attentive  Affect:  Appropriate  Cognitive:  Appropriate  Insight: Appropriate and Good  Engagement in Group:  Engaged  Modes of Intervention:  Education  Additional Comments:   Pt's was asked to provide whom they consider a healthy support system based on their group topic this morning.  Pt consider her dad and mom to be her healthy support system as well as her best friend.   Jerline Pain 02/12/2015, 9:03 PM

## 2015-02-12 NOTE — Tx Team (Signed)
Initial Interdisciplinary Treatment Plan   PATIENT STRESSORS: Financial difficulties Marital or family conflict Occupational concerns Substance abuse Traumatic event   PATIENT STRENGTHS: Ability for insight Average or above average intelligence Capable of independent living Communication skills General fund of knowledge Motivation for treatment/growth Physical Health Supportive family/friends   PROBLEM LIST: Problem List/Patient Goals Date to be addressed Date deferred Reason deferred Estimated date of resolution  "suicidal thoughts" 02/12/2015   D/c  "panic attacks" 02/12/2015   D/c  "anxiety" 02/12/2015   D/c  "depression" 02/12/2015   D/c  "alcohol" 02/12/2015   D/c                           DISCHARGE CRITERIA:  Ability to meet basic life and health needs Adequate post-discharge living arrangements Improved stabilization in mood, thinking, and/or behavior Medical problems require only outpatient monitoring Motivation to continue treatment in a less acute level of care Need for constant or close observation no longer present Reduction of life-threatening or endangering symptoms to within safe limits Safe-care adequate arrangements made Verbal commitment to aftercare and medication compliance  PRELIMINARY DISCHARGE PLAN: Attend aftercare/continuing care group Attend PHP/IOP Outpatient therapy Participate in family therapy Return to previous living arrangement Return to previous work or school arrangements  PATIENT/FAMIILY INVOLVEMENT: This treatment plan has been presented to and reviewed with the patient, Tiffanie Xxx-Altmann.  The patient and family have been given the opportunity to ask questions and make suggestions.  Cammy Copa 02/12/2015, 4:35 PM

## 2015-02-13 ENCOUNTER — Encounter (HOSPITAL_COMMUNITY): Payer: Self-pay | Admitting: Psychiatry

## 2015-02-13 DIAGNOSIS — G4700 Insomnia, unspecified: Secondary | ICD-10-CM

## 2015-02-13 DIAGNOSIS — F332 Major depressive disorder, recurrent severe without psychotic features: Principal | ICD-10-CM

## 2015-02-13 DIAGNOSIS — F419 Anxiety disorder, unspecified: Secondary | ICD-10-CM

## 2015-02-13 LAB — URINALYSIS, ROUTINE W REFLEX MICROSCOPIC
BILIRUBIN URINE: NEGATIVE
Glucose, UA: NEGATIVE mg/dL
Hgb urine dipstick: NEGATIVE
KETONES UR: NEGATIVE mg/dL
LEUKOCYTES UA: NEGATIVE
NITRITE: NEGATIVE
Protein, ur: NEGATIVE mg/dL
SPECIFIC GRAVITY, URINE: 1.013 (ref 1.005–1.030)
UROBILINOGEN UA: 1 mg/dL (ref 0.0–1.0)
pH: 7 (ref 5.0–8.0)

## 2015-02-13 LAB — RAPID URINE DRUG SCREEN, HOSP PERFORMED
Amphetamines: NOT DETECTED
Barbiturates: NOT DETECTED
Benzodiazepines: POSITIVE — AB
Cocaine: NOT DETECTED
OPIATES: NOT DETECTED
Tetrahydrocannabinol: NOT DETECTED

## 2015-02-13 LAB — PREGNANCY, URINE: PREG TEST UR: NEGATIVE

## 2015-02-13 MED ORDER — ADULT MULTIVITAMIN W/MINERALS CH
1.0000 | ORAL_TABLET | Freq: Every day | ORAL | Status: DC
  Administered 2015-02-13 – 2015-02-17 (×5): 1 via ORAL
  Filled 2015-02-13 (×7): qty 1

## 2015-02-13 MED ORDER — VALACYCLOVIR HCL 500 MG PO TABS
1000.0000 mg | ORAL_TABLET | Freq: Two times a day (BID) | ORAL | Status: DC
  Administered 2015-02-13 – 2015-02-17 (×8): 1000 mg via ORAL
  Filled 2015-02-13 (×12): qty 2

## 2015-02-13 MED ORDER — CITALOPRAM HYDROBROMIDE 40 MG PO TABS
40.0000 mg | ORAL_TABLET | Freq: Every day | ORAL | Status: DC
  Administered 2015-02-13 – 2015-02-17 (×5): 40 mg via ORAL
  Filled 2015-02-13 (×7): qty 1

## 2015-02-13 MED ORDER — DIAZEPAM 5 MG PO TABS
10.0000 mg | ORAL_TABLET | Freq: Every day | ORAL | Status: DC
  Administered 2015-02-13: 10 mg via ORAL
  Filled 2015-02-13: qty 2

## 2015-02-13 MED ORDER — BISACODYL 10 MG RE SUPP
10.0000 mg | Freq: Once | RECTAL | Status: DC
  Filled 2015-02-13: qty 1

## 2015-02-13 MED ORDER — VALACYCLOVIR HCL 500 MG PO TABS: 1000.0000 mg | ORAL_TABLET | Freq: Two times a day (BID) | ORAL | Status: DC

## 2015-02-13 MED ORDER — DOCUSATE SODIUM 100 MG PO CAPS
100.0000 mg | ORAL_CAPSULE | Freq: Once | ORAL | Status: AC
  Administered 2015-02-13: 100 mg via ORAL
  Filled 2015-02-13 (×2): qty 1

## 2015-02-13 MED ORDER — TRAMADOL HCL 50 MG PO TABS
50.0000 mg | ORAL_TABLET | Freq: Two times a day (BID) | ORAL | Status: DC
  Administered 2015-02-13 (×2): 50 mg via ORAL
  Filled 2015-02-13 (×3): qty 1

## 2015-02-13 MED ORDER — DIAZEPAM 5 MG PO TABS: 10.0000 mg | ORAL_TABLET | Freq: Every evening | ORAL | Status: DC | PRN

## 2015-02-13 NOTE — BHH Group Notes (Signed)
[place] LCSW Group Therapy  02/13/2015 1:15pm  Type of Therapy:  Group Therapy vercoming Obstacles  Participation Level:  Active  Participation Quality:  Appropriate   Affect:  Appropriate  Cognitive:  Appropriate and Oriented  Insight:  Developing/Improving and Improving  Engagement in Therapy:  Improving  Modes of Intervention:  Discussion, Exploration, Problem-solving and Support  Description of Group:   In this group patients will be encouraged to explore what they see as obstacles to their own wellness and recovery. They will be guided to discuss their thoughts, feelings, and behaviors related to these obstacles. The group will process together ways to cope with barriers, with attention given to specific choices patients can make. Each patient will be challenged to identify changes they are motivated to make in order to overcome their obstacles. This group will be process-oriented, with patients participating in exploration of their own experiences as well as giving and receiving support and challenge from other group members.  Summary of Patient Progress: Pt participated appropriately in group discussion and identified feelings related to her current obstacles. She reported feeling like she is "drowning" when facing certain obstacles. She expressed that having support is essential to recovery, including clinical support. She expressed feeling better since admission as she has had time to process her situation and has been welcomed by peers.   Therapeutic Modalities:   Cognitive Behavioral Therapy Solution Focused Therapy Motivational Interviewing Relapse Prevention Therapy   Peri Maris, LCSWA 02/13/2015 3:20 PM

## 2015-02-13 NOTE — Progress Notes (Signed)
Patient stated she wants B12 and bioten.
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
## 2015-02-13 NOTE — Progress Notes (Signed)
Adult Psychoeducational Group Note  Date:  02/13/2015 Time:  8:45 PM  Group Topic/Focus:  Wrap-Up Group:   The focus of this group is to help patients review their daily goal of treatment and discuss progress on daily workbooks.  Participation Level:  Active  Participation Quality:  Appropriate  Affect:  Appropriate  Cognitive:  Appropriate  Insight: Good  Engagement in Group:  Engaged  Modes of Intervention:  Discussion  Additional Comments:  Pt was very pleasant during wrap-up group. Pt rated overall day a 9 out of 10 because of positive interaction with other patients on the unit throughout the day. Pt noted that she is a "people person" and loves to interact with others. Pt also noted that the highlight of her day was leaving her room and going to the cafeteria. Pt reported that her goal for the day was to "get better and feel better" which she feels that she is working towards achieving.   Lincoln Brigham 02/13/2015, 9:40 PM

## 2015-02-13 NOTE — Progress Notes (Signed)
D: Patient in the hallway on approach. Patient states she had a good day and she has no complains other that being bored.  Patient states she does not feel she needs to go to all the groups.  Patient states she is ready to get discharged so she can get back to work.  Patient denies SI/HI and denies AVH. A: Staff to monitor Q 15 mins for safety.  Encouragement and support offered.  Scheduled medications administered per orders.  Vistaril administered prn for anxiety.   R: Patient remains safe on the unit.  Patient attended group tonight.  Patient visible on the unit and interacting with peers.  Patient taking administered medications.
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
## 2015-02-13 NOTE — Progress Notes (Signed)
Patient ID: Nicole Wallace Xxx-Mishra, female   DOB: 20-May-1980, 34 y.o.   MRN: [id number]  DAR: Pt. Denies SI/HI and A/V Hallucinations. Patient does not report any pain or discomfort at this time. Support and encouragement provided to the patient. Patient encouraged to attend groups to which patient stated, "I didn't sign up for that." Scheduled medications administered to patient per physician's orders. Patient is pleasant with interaction however does not appear vested at this time as evidenced by sleeping in late and not attending group. She is however seen in the milieu interacting with her peers. Q15 minute checks are maintained for safety.

## 2015-02-13 NOTE — Plan of Care (Signed)
Problem: Diagnosis: Increased Risk For Suicide Attempt Goal: STG-Patient Will Report Suicidal Feelings to Staff Outcome: Progressing Patient denies at this time.

## 2015-02-13 NOTE — Plan of Care (Signed)
Problem: Alteration in mood Goal: LTG-Patient reports reduction in suicidal thoughts (Patient reports reduction in suicidal thoughts and is able to verbalize a safety plan for whenever patient is feeling suicidal)  Outcome: Progressing Patient denies SI.  Patient verbally contracts for safety.     

## 2015-02-13 NOTE — BHH Group Notes (Signed)
[facility] LCSW Aftercare Discharge Planning Group Note  02/13/2015 8:45 AM  Pt did not attend, declined invitation.   Peri Maris, [place] 02/13/2015 10:01 AM
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
## 2015-02-14 MED ORDER — TRAMADOL HCL 50 MG PO TABS: 50.0000 mg | ORAL_TABLET | Freq: Two times a day (BID) | ORAL | Status: DC | PRN

## 2015-02-14 MED ORDER — MAGNESIUM CITRATE PO SOLN
1.0000 | Freq: Once | ORAL | Status: AC
  Administered 2015-02-14: 1 via ORAL

## 2015-02-14 MED ORDER — DIAZEPAM 5 MG PO TABS
5.0000 mg | ORAL_TABLET | Freq: Every day | ORAL | Status: DC
  Administered 2015-02-14: 5 mg via ORAL
  Filled 2015-02-14: qty 1

## 2015-02-14 MED ORDER — BISACODYL 10 MG RE SUPP
10.0000 mg | Freq: Once | RECTAL | Status: AC
  Administered 2015-02-14: 10 mg via RECTAL
  Filled 2015-02-14 (×2): qty 1

## 2015-02-14 NOTE — BHH Group Notes (Signed)
[place] LCSW Group Therapy 02/14/2015 1:15 PM  Type of Therapy: Group Therapy- Feelings about Diagnosis  Participation Level: Active in beginning  Participation Quality:  Appropriate  Affect:  Appropriate  Cognitive: Alert and Oriented   Insight:  Developing   Engagement in Therapy: Developing/Improving and Engaged   Modes of Intervention: Clarification, Confrontation, Discussion, Education, Exploration, Limit-setting, Orientation, Problem-solving, Rapport Building, Art therapist, Socialization and Support  Description of Group:   This group will allow patients to explore their thoughts and feelings about diagnoses they have received. Patients will be guided to explore their level of understanding and acceptance of these diagnoses. Facilitator will encourage patients to process their thoughts and feelings about the reactions of others to their diagnosis, and will guide patients in identifying ways to discuss their diagnosis with significant others in their lives. This group will be process-oriented, with patients participating in exploration of their own experiences as well as giving and receiving support and challenge from other group members.  Summary of Progress/Problems:  Pt participated early on in discussion but slept through most of group discussion. Pt discussed how finding joy in things that used to make her happy does not happen when she is depressed. She expressed that she likes her ability to have fun and laugh.  Therapeutic Modalities:   Cognitive Behavioral Therapy Solution Focused Therapy Motivational Interviewing Relapse Prevention Therapy  Peri Maris, LCSWA 02/14/2015 4:46 PM

## 2015-02-14 NOTE — Progress Notes (Addendum)
Patient ID: [name] Xxx-Bost, female   DOB: Nov 20, 1980, 34 y.o.   MRN: [id number]  Pt currently presents with an animated affect and impulsive behavior. Per self inventory, pt rates depression at a 2, hopelessness 0 and anxiety 2. Pt's daily goal is to "work thru personal issues" and they intend to do so by "talking it out." Pt reports good sleep, a fair appetite, high energy and good concentration. Pt heard in dayroom calling staff derogatory names, pt states "I'm here cuz I got no worries, it's like [facility] at the [facility]." Pt tells [facility] she is not going to the cafeteria because "the other pts freak me out." Pt informed of rules for unit and encouraged to attend meals, pt redirectable.  Pt provided with medications per providers orders. Pt's labs and vitals were monitored throughout the day. Pt supported emotionally and encouraged to express concerns and questions. Pt educated on medications. Pt given multiple medications to relieve constipation, abdominal assessment completed.  Pt's safety ensured with 15 minute and environmental checks. Pt currently denies SI/HI and A/V hallucinations. Pt verbally agrees to seek staff if SI/HI or A/VH occurs and to consult with staff before acting on these thoughts. Pt seen jogging in place in bedroom laughing and talking with roommate. Pt reports relief from suppository, last bm 02/14/2015. Will continue POC.

## 2015-02-14 NOTE — Progress Notes (Signed)
Patient ID: Nicole Wallace, female   DOB: 1981/02/05, 34 y.o.   MRN: [id number]  Adult Psychoeducational Group Note  Date:  02/14/2015 Time: 09:00am  Group Topic/Focus:  Goals Group:   The focus of this group is to help patients establish daily goals to achieve during treatment and discuss how the patient can incorporate goal setting into their daily lives to aide in recovery.  Participation Level:  Active  Participation Quality:  Appropriate  Affect:  Appropriate  Cognitive:  Appropriate  Insight: Appropriate  Engagement in Group:  Engaged  Modes of Intervention:  Discussion, Education, Orientation and Support  Additional Comments:  Pt able to identify daily goal to accomplish with treatment team today.  [name] 02/14/2015, 9:40 AM

## 2015-02-14 NOTE — Progress Notes (Signed)
D:Patient in the hallway on approach.  Patient states she had a good day.  Patient intrusive and loud.  Patient states she does not need to be here and the rules do not apply to her.  Patient did state they had better food choice in the cafeteria today.  Patient denies SI/HI and denies AVH. A: Staff to monitor Q 15 mins for safety.  Encouragement and support offered.  Scheduled medications administered per orders.  Trazodone administered prn for sleep. R: Patient remains safe on the unit.  Patient attended group tonight.  Patient visible on the unit and interacting with peers.  Patient taking administered medications.

## 2015-02-14 NOTE — Plan of Care (Signed)
Problem: Alteration in mood Goal: STG-Patient is able to discuss feelings and issues (Patient is able to discuss feelings and issues leading to depression)  Outcome: Progressing Pt able to express concerns over issues with her boyfriend, jealousy and her families influence in her past relationship. Pt also identifies the death of her "best friend" as something that still causes her pain.

## 2015-02-14 NOTE — Progress Notes (Signed)
Recreation Therapy Notes  Animal-Assisted Activity (AAA) Program Checklist/Progress Notes Patient Eligibility Criteria Checklist & Daily Group note for Rec Tx Intervention  Date: 10.04.2016 Time: 2:45pm Location: [address]    AAA/T Program Assumption of Risk Form signed by Patient/ or Parent Legal Guardian yes  Patient is free of allergies or sever asthma yes  Patient reports no fear of animals yes  Patient reports no history of cruelty to animals yes  Patient understands his/her participation is voluntary yes  Behavioral Response: Did not attend.    Laureen Ochs Quadasia [place], LRT/CTRS  Rowland Ericsson L 02/14/2015 3:12 PM

## 2015-02-14 NOTE — Progress Notes (Signed)
Adult Psychoeducational Group Note  Date:  02/14/2015 Time:  9:37 PM  Group Topic/Focus:  Wrap-Up Group:   The focus of this group is to help patients review their daily goal of treatment and discuss progress on daily workbooks.  Participation Level:  Active  Participation Quality:  Appropriate  Affect:  Appropriate  Cognitive:  Appropriate  Insight: Appropriate  Engagement in Group:  Engaged  Modes of Intervention:  Discussion  Additional Comments:  The patient expressed that she attended group.The patient also said that she learned in group to have better surroundings.  Nicole Wallace 02/14/2015, 9:37 PM

## 2015-02-14 NOTE — Tx Team (Signed)
Interdisciplinary Treatment Plan Update (Adult) Date: 02/14/2015   Date: 02/14/2015 8:53 AM  Progress in Treatment:  Attending groups: Yes  Participating in groups: Yes  Taking medication as prescribed: Yes  Tolerating medication: Yes  Family/Significant othe contact made: No, CSW attempting to make contact with parents Patient understands diagnosis: Yes Discussing patient identified problems/goals with staff: Yes  Medical problems stabilized or resolved: Yes  Denies suicidal/homicidal ideation: Yes Patient has not harmed self or Others: Yes   New problem(s) identified: None identified at this time.   Discharge Plan or Barriers: Pt will return home and follow-up with outpatient providers  Additional comments: n/a   Reason for Continuation of Hospitalization:  Depression Medication stabilization Suicidal ideation  Estimated length of stay: 3-5 days  Review of initial/current patient goals per problem list:   1.  Goal(s): Patient will participate in aftercare plan  Met:  Yes  Target date: 3-5 days from date of admission   As evidenced by: Patient will participate within aftercare plan AEB aftercare provider and housing plan at discharge being identified.   02/14/15: Pt will return home and follow-up with outpatient providers.  2.  Goal (s): Patient will exhibit decreased depressive symptoms and suicidal ideations.  Met:  Goal progressing  Target date: 3-5 days from date of admission   As evidenced by: Patient will utilize self rating of depression at 3 or below and demonstrate decreased signs of depression or be deemed stable for discharge by MD.  02/14/15: Pt reports improving mood and is attending groups; Pt less irritable and has improving affect.   Attendees:  Patient:    Family:    Physician: Dr. Cobos, MD  02/14/2015 8:53 AM  Nursing: Jennifer Clark, RN Case manager  02/14/2015 8:53 AM  Clinical Social Worker  Carter, LCSWA, MSW 02/14/2015 8:53 AM  Other:  Valerie Enoch, [facility] 02/14/2015 8:53 AM  Clinical: Sara Twyman, RN 02/14/2015 8:53 AM  Other: , RN Charge Nurse 02/14/2015 8:53 AM  Other:      Carter, LCSWA MSW   

## 2015-02-14 NOTE — Progress Notes (Signed)
[facility] MD Progress Note  02/14/2015 6:30 PM Nicole Wallace  MRN:  [id number] Subjective:   Patient reports improvement.  Objective :I have discussed case with treatment team and have met with patient. Behavior on unit calm, in good control. Has been going to groups . She is a  34 y.o.  Employed female who lives alone . She recently developed depression, suicidal ideations in the context of her BF being distrustful of her and intrusive , by checking her iphone messages and interactions without her consent . She states that she tends to be a private person, and found the intrusion and distrust very upsetting. She had thoughts of overdosing on prescribed medication ( Celexa) but did not . She also reportedly wrote goodbye letter to mother, gave it to a neighbor.  There is a prior history of depression and  Of suicidal ideations. At this time patient presents calm, pleasant , and she states she is feeling better, less depressed, and at this time presents with a reactive, appropriate affect. She is future oriented and is wanting to be discharged soon in order to return to work and to her daily exercise routine .   Denies medication side effects.- she has been on Celexa and Valium ( for insomnia )  No disruptive behaviors on unit .  Principal Problem: MDD (major depressive disorder), recurrent episode, severe ([place]) Diagnosis:   Patient Active Problem List   Diagnosis Date Noted  . MDD (major depressive disorder), recurrent episode, severe ([place]) [F33.2] 02/12/2015  . Migraine [G43.909] 04/13/2012  . Fibroids [D25.9] 04/13/2012  . Anxiety [F41.9] 04/13/2012   Total Time spent with patient: 25 minutes     Past Medical History:  Past Medical History  Diagnosis Date  . Migraine   . Fibroid uterus   . Duodenitis     mild  . Allergy   . Heart murmur   . LSIL (low grade squamous intraepithelial lesion) on Pap smear     colposcopy 03/2010; normal paps after  . 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 Depression    . Anxiety     Past Surgical History  Procedure Laterality Date  . Colonic polyp removed   05/14/2007    non-malignant  . Intrauterine device insertion  06/21/2013    Mirena   Family History:  Family History  Problem Relation Age of Onset  . Asthma Mother   . Hypertension Father   . Hypertension Sister     cardiomegaly  . Hyperlipidemia Sister   . Asthma Sister   . Scoliosis Sister   . Fibromyalgia Sister   . Heart disease Sister 36    "mild heart attack"  . Arthritis Maternal Grandmother   . Heart disease Maternal Grandmother   . Kidney disease Maternal Grandmother   . Hyperlipidemia Maternal Grandmother   . Heart disease Maternal Grandfather   . Hypertension Maternal Grandfather   . Hyperlipidemia Maternal Grandfather   . Cancer Paternal Grandmother     lung  . Aneurysm Paternal Grandfather   . Asthma Maternal Aunt   . Diabetes Maternal Aunt   . Heart disease Maternal Aunt     Social History:  History  Alcohol Use  . 0.0 - 0.6 oz/week  . 0-1 Standard drinks or equivalent per week    Comment: one alcohol drink every 2 weeks socially     History  Drug Use No    Comment: Patient denies     Social History   Social History  . Marital Status: Married  Spouse Name: estranged  . Number of Children: 0  . Years of Education: 15   Occupational History  . 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 Smoking status: Never Smoker   . Smokeless tobacco: Never Used  . Alcohol Use: 0.0 - 0.6 oz/week    0-1 Standard drinks or equivalent per week     Comment: one alcohol drink every 2 weeks socially  . Drug Use: No     Comment: Patient denies   . Sexual Activity:    Partners: Male    Birth Control/ Protection: IUD     Comment: Mirena 06/21/2013-1st intercourse 34 yo-More than 5 partners   Other Topics Concern  . None   Social History Narrative   Separated from husband since 06/2010 (he lives in [place], [place]).   She lives alone. Her father lives very nearby in [place].   Additional Social History:    Pain Medications: ultram Prescriptions: valtrex   ultram   celexa   valium   multivitamin   B-12 Over the Counter: multivitamin History of alcohol / drug use?: Yes Longest period of sobriety (when/how long): several weeks between social drink Negative Consequences of Use: Personal relationships Withdrawal Symptoms: Other (Comment) (anxiety, social drink every several weeks) Name of Substance 1: alcohol 1 - Age of First Use: 34 yrs old 1 - Amount (size/oz): one social drink every few weeks 1 - Frequency: one social drink every few weeks 1 - Duration: since age 20 yrs old 1 - Last Use / Amount: one social drink every 2 weeks this past weekend     Sleep: fair , but improved   Appetite:  Good  Current Medications: Current Facility-Administered Medications  Medication Dose Route Frequency Provider Last Rate Last Dose  . acetaminophen (TYLENOL) tablet 650 mg  650 mg Oral Q6H PRN Niel Hummer, NP      . alum & mag hydroxide-simeth (MAALOX/MYLANTA) 200-200-20 MG/5ML suspension 30 mL  30 mL Oral Q4H PRN Niel Hummer, NP      . citalopram (CELEXA) tablet 40 mg  40 mg Oral Daily Nicholaus Bloom, MD   40 mg at 02/14/15 0737  . diazepam (VALIUM) tablet 10 mg  10 mg Oral QHS Nicholaus Bloom, MD   10 mg at 02/13/15 2332  . hydrOXYzine (ATARAX/VISTARIL) tablet 25 mg  25 mg Oral TID PRN Harriet Butte, NP   25 mg at 02/14/15 1824  . ibuprofen (ADVIL,MOTRIN) tablet 400 mg  400 mg Oral Q6H PRN Niel Hummer, NP   400 mg at 02/12/15 1649  . magnesium hydroxide (MILK OF MAGNESIA) suspension 30 mL  30 mL Oral Daily PRN Niel Hummer, NP   30 mL at 02/13/15 1447  . multivitamin with minerals tablet 1 tablet  1 tablet Oral Daily Nicholaus Bloom, MD   1 tablet at 02/14/15 0737  . traMADol (ULTRAM) tablet 50 mg  50 mg Oral Q12H Nicholaus Bloom, MD   50 mg at 02/13/15 2332  . traZODone (DESYREL) tablet 50 mg  50 mg Oral  QHS PRN Niel Hummer, NP   50 mg at 02/12/15 2307  . valACYclovir (VALTREX) tablet 1,000 mg  1,000 mg Oral BID Nicholaus Bloom, MD   1,000 mg at 02/14/15 1653    Lab Results:  Results for orders placed or performed during the hospital encounter of 02/12/15 (from the past 48 hour(s))  CBC  Status: Abnormal   Collection Time: 02/12/15  7:21 PM  Result Value Ref Range   WBC 6.5 4.0 - 10.5 K/uL   RBC 3.90 3.87 - 5.11 MIL/uL   Hemoglobin 13.3 12.0 - 15.0 g/dL   HCT 38.4 36.0 - 46.0 %   MCV 98.5 78.0 - 100.0 fL   MCH 34.1 (H) 26.0 - 34.0 pg   MCHC 34.6 30.0 - 36.0 g/dL   RDW 12.0 11.5 - 15.5 %   Platelets 217 150 - 400 K/uL    Comment: Performed at [facility]  Comprehensive metabolic panel     Status: Abnormal   Collection Time: 02/12/15  7:21 PM  Result Value Ref Range   Sodium 140 135 - 145 mmol/L   Potassium 3.8 3.5 - 5.1 mmol/L   Chloride 106 101 - 111 mmol/L   CO2 29 22 - 32 mmol/L   Glucose, Bld 108 (H) 65 - 99 mg/dL   BUN 8 6 - 20 mg/dL   Creatinine, Ser 0.55 0.44 - 1.00 mg/dL   Calcium 9.4 8.9 - 10.3 mg/dL   Total Protein 7.8 6.5 - 8.1 g/dL   Albumin 4.8 3.5 - 5.0 g/dL   AST 24 15 - 41 U/L   ALT 22 14 - 54 U/L   Alkaline Phosphatase 53 38 - 126 U/L   Total Bilirubin 0.8 0.3 - 1.2 mg/dL   GFR calc non Af Amer >60 >60 mL/min   GFR calc Af Amer >60 >60 mL/min    Comment: (NOTE) The eGFR has been calculated using the CKD EPI equation. This calculation has not been validated in all clinical situations. eGFR's persistently <60 mL/min signify possible Chronic Kidney Disease.    Anion gap 5 5 - 15    Comment: Performed at [facility]  Ethanol     Status: None   Collection Time: 02/12/15  7:21 PM  Result Value Ref Range   Alcohol, Ethyl (B) <5 <5 mg/dL    Comment:        LOWEST DETECTABLE LIMIT FOR SERUM ALCOHOL IS 5 mg/dL FOR MEDICAL PURPOSES ONLY Performed at [facility]   TSH     Status: None   Collection  Time: 02/12/15  7:21 PM  Result Value Ref Range   TSH 1.904 0.350 - 4.500 uIU/mL    Comment: Performed at [facility]  Urinalysis, Routine w reflex microscopic (not at [facility])     Status: Abnormal   Collection Time: 02/13/15  7:15 AM  Result Value Ref Range   Color, Urine YELLOW YELLOW   APPearance CLOUDY (A) CLEAR   Specific Gravity, Urine 1.013 1.005 - 1.030   pH 7.0 5.0 - 8.0   Glucose, UA NEGATIVE NEGATIVE mg/dL   Hgb urine dipstick NEGATIVE NEGATIVE   Bilirubin Urine NEGATIVE NEGATIVE   Ketones, ur NEGATIVE NEGATIVE mg/dL   Protein, ur NEGATIVE NEGATIVE mg/dL   Urobilinogen, UA 1.0 0.0 - 1.0 mg/dL   Nitrite NEGATIVE NEGATIVE   Leukocytes, UA NEGATIVE NEGATIVE    Comment: MICROSCOPIC NOT DONE ON URINES WITH NEGATIVE PROTEIN, BLOOD, LEUKOCYTES, NITRITE, OR GLUCOSE <1000 mg/dL. Performed at [facility]   Pregnancy, urine     Status: None   Collection Time: 02/13/15  7:15 AM  Result Value Ref Range   Preg Test, Ur NEGATIVE NEGATIVE    Comment:        THE SENSITIVITY OF THIS METHODOLOGY IS >20 mIU/mL. Performed at [facility]   Urine rapid drug screen (hosp performed)not at [facility]     Status: Abnormal   Collection Time: 02/13/15  7:16 AM  Result Value Ref Range   Opiates NONE DETECTED NONE DETECTED   Cocaine NONE DETECTED NONE DETECTED   Benzodiazepines POSITIVE (A) NONE DETECTED   Amphetamines NONE DETECTED NONE DETECTED   Tetrahydrocannabinol NONE DETECTED NONE DETECTED   Barbiturates NONE DETECTED NONE DETECTED    Comment:        DRUG SCREEN FOR MEDICAL PURPOSES ONLY.  IF CONFIRMATION IS NEEDED FOR ANY PURPOSE, NOTIFY LAB WITHIN 5 DAYS.        LOWEST DETECTABLE LIMITS FOR URINE DRUG SCREEN Drug Class       Cutoff (ng/mL) Amphetamine      1000 Barbiturate      200 Benzodiazepine   784 Tricyclics       696 Opiates          300 Cocaine          300 THC              50 Performed at [facility]     Physical Findings: AIMS: Facial and Oral Movements Muscles of Facial Expression: None, normal Lips and Perioral Area: None, normal Jaw: None, normal Tongue: None, normal,Extremity Movements Upper (arms, wrists, hands, fingers): None, normal Lower (legs, knees, ankles, toes): None, normal, Trunk Movements Neck, shoulders, hips: None, normal, Overall Severity Severity of abnormal movements (highest score from questions above): None, normal Incapacitation due to abnormal movements: None, normal Patient's awareness of abnormal movements (rate only patient's report): No Awareness, Dental Status Current problems with teeth and/or dentures?: No Does patient usually wear dentures?: No  CIWA:  CIWA-Ar Total: 4 COWS:  COWS Total Score: 4  Musculoskeletal: Strength & Muscle Tone: within normal limits Gait & Station: normal Patient leans: N/A  Psychiatric Specialty Exam: ROS denies chest pain, denies shortness of breath, denies coughing, no nausea, no vomiting   Blood pressure 116/66, pulse 60, temperature 97.9 F (36.6 C), temperature source Oral, resp. rate 18, height 5' 4" (1.626 m), weight 141 lb (63.957 kg), last menstrual period 01/25/2015, SpO2 100 %.Body mass index is 24.19 kg/(m^2).  General Appearance: Well Groomed  Engineer, water::  Good  Speech:  Normal Rate  Volume:  Normal  Mood:  describes mood as improved, and today not feeling as depressed   Affect:  Appropriate and reactive   Thought Process:  Linear  Orientation:  Full (Time, Place, and Person)  Thought Content:  denies hallucinations, no delusions, not internally preoccupied,  ruminative about relationship stress   Suicidal Thoughts:  No at this time denies any SI or any self injurious ideations , also denies any thoughts of violence towards anyone   Homicidal Thoughts:  No  Memory:  recent and remote grossly intact   Judgement:  Fair  Insight:  Fair  Psychomotor Activity:  Normal  Concentration:  Good   Recall:  Good  Fund of Knowledge:Good  Language: Good  Akathisia:  Negative  Handed:  Right  AIMS (if indicated):     Assets:  Communication Skills Desire for Improvement Physical Health Resilience Social Support Vocational/Educational  ADL's:  Intact  Cognition: WNL  Sleep:  Number of Hours: 5.25  Assessment - patient reports recent suicidal ideation- with thoughts of overdosing on prescribed medication- in the context of relationship stress with BF, feeling distrusted and her privacy intruded upon. States that today she is feeling better, denies any  current SI, and does not present with severe neurovegetative symptoms and with a fully reactive affect . Insomnia is a long term issue for which she takes Valium. We have discussed addictive potential, potential for AM sedation from this long acting BZD, and patient agrees to taper off , try alternative such as Trazodone .  Treatment Plan Summary: Daily contact with patient to assess and evaluate symptoms and progress in treatment, Medication management, Plan inpatient admission and medications as below   Continue Celexa  40 mgrs QDAY for depression, anxiety Decrease Valium  To 5  mgrs QHS for insomnia, anxiety Continue Trazodone 50 mgrs QHS PRN for insomnia if  needed  Continue Vistaril 25 mgrs Q 6 hours PRN for anxiety Continue Ultram PRN Q 12 hours for severe pain if needed  Obtain EKG to monitor QTc interval  ,  02/14/2015, 6:30 PM

## 2015-02-15 MED ORDER — DIAZEPAM 2 MG PO TABS
2.0000 mg | ORAL_TABLET | Freq: Every day | ORAL | Status: DC
  Administered 2015-02-15: 2 mg via ORAL
  Filled 2015-02-15: qty 1

## 2015-02-15 NOTE — Progress Notes (Signed)
D) Pt. Affect superficially bright.  Pt. Interacting frequently with staff and seeks to help other patients communicate with staff.  Pt. Reports that she vomited x1 after lunch.  Pt. States it was an isolated incident.  Pt. Denies any other symptoms at this time.  A) Support offered.  Pt. Given gingerale for nausea.  Pt. Encouraged to address self care needs as it is evident that she focuses on care of others.  R) Pt. Remains on q 15 min. Observations and continues safe at this time.

## 2015-02-15 NOTE — Progress Notes (Signed)
Patient ID: Nicole Wallace, female   DOB: 16-Jul-1980, 34 y.o.   MRN: [id number] [facility] MD Progress Note  02/15/2015 4:38 PM Nicole Wallace  MRN:  [id number] Subjective:    She states she has felt better in general, but at times still ruminates about relationship stressors, and worries about future or her relationship. She expresses some anxiety related to having to set limites with her boyfriend regarding his looking at her personal text messages , etc, not because of any domestic violence fears or similar but " simply because I really do not like confrontation at all". Denies medication side effects.   Objective :I have discussed case with treatment team and have met with patient. Patient visible on unit, going to groups, participative , staff note that she at times tends to be loud, monopolizing, but overall behavior is in good control, and no overtly disruptive behaviors on unit . She is denying any medication side effects. Mood is improved, and at this time affect is reactive, pleasant, not pressured, irritable or expansive . Today patient spoke about ongoing sense of grief and loss about a good friend of hers who died in a car accident years ago, and describes a subjective sense that she often feels depressed which she addresses by working out often and working. She is future oriented , and is wanting to go back to college to get further education .    She responds well to support, empathy, and affect is reactive at this time. Of note, EKG WNL, NSR , no QTc prolongation.  Principal Problem: MDD (major depressive disorder), recurrent episode, severe ([place]) Diagnosis:   Patient Active Problem List   Diagnosis Date Noted  . MDD (major depressive disorder), recurrent episode, severe ([place]) [F33.2] 02/12/2015  . Migraine [G43.909] 04/13/2012  . Fibroids [D25.9] 04/13/2012  . Anxiety [F41.9] 04/13/2012   Total Time spent with patient: 25 minutes     Past Medical History:  Past  Medical History  Diagnosis Date  . Migraine   . Fibroid uterus   . Duodenitis     mild  . Allergy   . Heart murmur   . LSIL (low grade squamous intraepithelial lesion) on Pap smear     colposcopy 03/2010; normal paps after  . 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 Depression   . Anxiety     Past Surgical History  Procedure Laterality Date  . Colonic polyp removed   05/14/2007    non-malignant  . Intrauterine device insertion  06/21/2013    Mirena   Family History:  Family History  Problem Relation Age of Onset  . Asthma Mother   . Hypertension Father   . Hypertension Sister     cardiomegaly  . Hyperlipidemia Sister   . Asthma Sister   . Scoliosis Sister   . Fibromyalgia Sister   . Heart disease Sister 33    "mild heart attack"  . Arthritis Maternal Grandmother   . Heart disease Maternal Grandmother   . Kidney disease Maternal Grandmother   . Hyperlipidemia Maternal Grandmother   . Heart disease Maternal Grandfather   . Hypertension Maternal Grandfather   . Hyperlipidemia Maternal Grandfather   . Cancer Paternal Grandmother     lung  . Aneurysm Paternal Grandfather   . Asthma Maternal Aunt   . Diabetes Maternal Aunt   . Heart disease Maternal Aunt     Social History:  History  Alcohol Use  . 0.0 - 0.6 oz/week  . 0-1 Standard drinks or equivalent per week  Comment: one alcohol drink every 2 weeks socially     History  Drug Use No    Comment: Patient denies     Social History   Social History  . Marital Status: Married    Spouse Name: estranged  . Number of Children: 0  . Years of Education: 15   Occupational History  . 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 Smoking status: Never Smoker   . Smokeless tobacco: Never Used  . Alcohol Use: 0.0 - 0.6 oz/week    0-1 Standard drinks or equivalent per week     Comment: one alcohol drink every 2 weeks socially  . Drug Use: No     Comment: Patient denies   . Sexual  Activity:    Partners: Male    Birth Control/ Protection: IUD     Comment: Mirena 06/21/2013-1st intercourse 34 yo-More than 5 partners   Other Topics Concern  . None   Social History Narrative   Separated from husband since 06/2010 (he lives in [place], [place]).  She lives alone. Her father lives very nearby in [place].   Additional Social History:    Pain Medications: ultram Prescriptions: valtrex   ultram   celexa   valium   multivitamin   B-12 Over the Counter: multivitamin History of alcohol / drug use?: Yes Longest period of sobriety (when/how long): several weeks between social drink Negative Consequences of Use: Personal relationships Withdrawal Symptoms: Other (Comment) (anxiety, social drink every several weeks) Name of Substance 1: alcohol 1 - Age of First Use: 34 yrs old 1 - Amount (size/oz): one social drink every few weeks 1 - Frequency: one social drink every few weeks 1 - Duration: since age 16 yrs old 1 - Last Use / Amount: one social drink every 2 weeks this past weekend     Sleep:  Improved   Appetite:  Good  Current Medications: Current Facility-Administered Medications  Medication Dose Route Frequency Provider Last Rate Last Dose  . acetaminophen (TYLENOL) tablet 650 mg  650 mg Oral Q6H PRN Niel Hummer, NP      . alum & mag hydroxide-simeth (MAALOX/MYLANTA) 200-200-20 MG/5ML suspension 30 mL  30 mL Oral Q4H PRN Niel Hummer, NP      . citalopram (CELEXA) tablet 40 mg  40 mg Oral Daily Nicholaus Bloom, MD   40 mg at 02/15/15 0934  . diazepam (VALIUM) tablet 5 mg  5 mg Oral QHS Myer Peer Cobos, MD   5 mg at 02/14/15 2318  . hydrOXYzine (ATARAX/VISTARIL) tablet 25 mg  25 mg Oral TID PRN Harriet Butte, NP   25 mg at 02/14/15 1824  . ibuprofen (ADVIL,MOTRIN) tablet 400 mg  400 mg Oral Q6H PRN Niel Hummer, NP   400 mg at 02/12/15 1649  . magnesium hydroxide (MILK OF MAGNESIA) suspension 30 mL  30 mL Oral Daily PRN Niel Hummer, NP   30 mL at 02/13/15  1447  . multivitamin with minerals tablet 1 tablet  1 tablet Oral Daily Nicholaus Bloom, MD   1 tablet at 02/15/15 [phone]  . traMADol (ULTRAM) tablet 50 mg  50 mg Oral Q12H PRN Jenne Campus, MD      . traZODone (DESYREL) tablet 50 mg  50 mg Oral QHS PRN Niel Hummer, NP   50 mg at 02/14/15 2318  . valACYclovir (VALTREX) tablet 1,000 mg  1,000 mg Oral  BID Nicholaus Bloom, MD   1,000 mg at 02/15/15 8016    Lab Results:  No results found for this or any previous visit (from the past 48 hour(s)).  Physical Findings: AIMS: Facial and Oral Movements Muscles of Facial Expression: None, normal Lips and Perioral Area: None, normal Jaw: None, normal Tongue: None, normal,Extremity Movements Upper (arms, wrists, hands, fingers): None, normal Lower (legs, knees, ankles, toes): None, normal, Trunk Movements Neck, shoulders, hips: None, normal, Overall Severity Severity of abnormal movements (highest score from questions above): None, normal Incapacitation due to abnormal movements: None, normal Patient's awareness of abnormal movements (rate only patient's report): No Awareness, Dental Status Current problems with teeth and/or dentures?: No Does patient usually wear dentures?: No  CIWA:  CIWA-Ar Total: 4 COWS:  COWS Total Score: 4  Musculoskeletal: Strength & Muscle Tone: within normal limits Gait & Station: normal Patient leans: N/A  Psychiatric Specialty Exam: ROS denies chest pain, denies shortness of breath, denies coughing, reported episode of vomiting x 1 , but at this time denies nausea.   Blood pressure 107/63, pulse 62, temperature 98.3 F (36.8 C), temperature source Oral, resp. rate 18, height _0  (1.626 m), weight 141 lb (63.957 kg), last menstrual period 01/25/2015, SpO2 100 %.Body mass index is 24.19 kg/(m^2).  General Appearance: Well Groomed  Engineer, water::  Good  Speech:  Normal Rate  Volume:  Normal  Mood:  Reports vague feeling of depression, but at this time presents  euthymic   Affect:  Appropriate and reactive   Thought Process:  Linear  Orientation:  Full (Time, Place, and Person)  Thought Content:  denies hallucinations, no delusions, not internally preoccupied,  ruminative about relationship stress   Suicidal Thoughts:  No at this time denies any SI or any self injurious ideations , also denies any thoughts of violence towards anyone   Homicidal Thoughts:  No  Memory:  recent and remote grossly intact   Judgement:  Other:  improving  Insight:  improving   Psychomotor Activity:  Normal  Concentration:  Good  Recall:  Good  Fund of Knowledge:Good  Language: Good  Akathisia:  Negative  Handed:  Right  AIMS (if indicated):     Assets:  Communication Skills Desire for Improvement Physical Health Resilience Social Support Vocational/Educational  ADL's:  Intact  Cognition: WNL  Sleep:  Number of Hours: 6  Assessment -  At this time patient improved compared to admission- reports a chronic sense of depression and loss related to death of good friend who died years ago in a MVA, but at this time presents euthymic, with a an appropriate , reactive range of affect. She is tolerating medications well, denies side effects. She slept better last night . Treatment Plan Summary: Daily contact with patient to assess and evaluate symptoms and progress in treatment, Medication management, Plan inpatient admission and medications as below   Continue Celexa  40 mgrs QDAY for depression, anxiety Decrease Valium  To 2  mgrs QHS for insomnia, anxiety-  Rationale is to gradually taper off BZD to minimize long term use complications, habituation. Continue Trazodone 50 mgrs QHS PRN for insomnia if  needed  Continue Vistaril 25 mgrs Q 6 hours PRN for anxiety Continue Ultram PRN Q 12 hours for severe pain if needed  Encourage group, milieu participation to work on coping skills, symptom reduction Consider discharge soon as she continues to improve - treatment team  working on discharge options   COBOS, [place] 02/15/2015, 4:38 PM

## 2015-02-15 NOTE — BHH Group Notes (Signed)
[place] LCSW Group Therapy 02/15/2015 1:15 PM  Type of Therapy: Group Therapy- Emotion Regulation  Participation Level: Minimal  Participation Quality:  Appropriate  Affect: Lethargic  Cognitive: Alert and Oriented   Insight:  Developing/Improving  Engagement in Therapy: Developing/Improving and Engaged   Modes of Intervention: Clarification, Confrontation, Discussion, Education, Exploration, Limit-setting, Orientation, Problem-solving, Rapport Building, Art therapist, Socialization and Support  Summary of Progress/Problems: The topic for group today was emotional regulation. This group focused on both positive and negative emotion identification and allowed group members to process ways to identify feelings, regulate negative emotions, and find healthy ways to manage internal/external emotions. Group members were asked to reflect on a time when their reaction to an emotion led to a negative outcome and explored how alternative responses using emotion regulation would have benefited them. Group members were also asked to discuss a time when emotion regulation was utilized when a negative emotion was experienced. Pt continues to participate early in group discussion and eventually falling asleep and does not wake up until group has concluded. Pt identified anger and sadness and difficult emotions to regulate but was able to identify coping skills to keep her anger within acceptable limits.   Peri Maris, LCSWA 02/15/2015 4:09 PM\

## 2015-02-15 NOTE — Progress Notes (Signed)
Recreation Therapy Notes   Date: 10.05.2016 Time: 9:30am Location: [address] Group Room   Group Topic: Stress Management  Goal Area(s) Addresses:  Patient will actively participate in stress management techniques presented during session.   Behavioral Response: Did not attend.   Laureen Ochs Danylle Ouk, LRT/CTRS        Makensey Rego L 02/15/2015 1:13 PM

## 2015-02-15 NOTE — Progress Notes (Signed)
D: Patient alert and oriented x 4. Patient denies pain/SI/HI/AVH. Patient come to this writer complaining of anxiety at 2010. PRN dose of vistaril was given with effective results.  A: Staff to monitor Q 15 mins for safety. Encouragement and support offered. Scheduled medications administered per orders. R: Patient remains safe on the unit. Patient attended group tonight. Patient visible on hte unit and interacting with peers. Patient taking administered medications.

## 2015-02-15 NOTE — BHH Group Notes (Signed)
[facility] LCSW Aftercare Discharge Planning Group Note  02/15/2015 8:45 AM  Participation Quality: Alert, Appropriate and Oriented  Mood/Affect: Appropriate  Depression Rating: 3  Anxiety Rating: 0  Thoughts of Suicide: Pt denies SI/HI  Will you contract for safety? Yes  Current AVH: Pt denies  Plan for Discharge/Comments: Pt attended discharge planning group and actively participated in group. CSW discussed suicide prevention education with the group and encouraged them to discuss discharge planning and any relevant barriers. Pt expresses that she feels fine this morning. She discussed her family wanting her to not stay alone for some time after hospitalization and reports that she does not want to stay with any of her family.   Transportation Means: Pt reports access to transportation  Supports: Parents  Peri Maris, Latanya Presser 02/15/2015 9:31 AM

## 2015-02-15 NOTE — Progress Notes (Signed)
Pt stated that she felt down for the most of the day, but was able to confront a issue that was going on between her and her boyfriend. Her goal for tomorrow is to be able to go back into the real world.

## 2015-02-16 MED ORDER — DIAZEPAM 5 MG PO TABS
5.0000 mg | ORAL_TABLET | Freq: Every day | ORAL | Status: DC
  Administered 2015-02-16: 5 mg via ORAL
  Filled 2015-02-16: qty 1

## 2015-02-16 MED ORDER — SIMETHICONE 80 MG PO CHEW
80.0000 mg | CHEWABLE_TABLET | Freq: Once | ORAL | Status: AC
  Administered 2015-02-16: 80 mg via ORAL
  Filled 2015-02-16 (×2): qty 1

## 2015-02-16 MED ORDER — ARIPIPRAZOLE 2 MG PO TABS
2.0000 mg | ORAL_TABLET | Freq: Every day | ORAL | Status: DC
  Administered 2015-02-16 – 2015-02-17 (×2): 2 mg via ORAL
  Filled 2015-02-16 (×5): qty 1

## 2015-02-16 NOTE — BHH Suicide Risk Assessment (Signed)
B[facility] INPATIENT:  Family/Significant Other Suicide Prevention Education  Suicide Prevention Education:  Education Completed; Mason Burleigh, Pt's father [phone],  has been identified by the patient as the family member/significant other with whom the patient will be residing, and identified as the person(s) who will aid the patient in the event of a mental health crisis (suicidal ideations/suicide attempt).  With written consent from the patient, the family member/significant other has been provided the following suicide prevention education, prior to the and/or following the discharge of the patient.  The suicide prevention education provided includes the following:  Suicide risk factors  Suicide prevention and interventions  National Suicide Hotline telephone number  [facility] assessment telephone number  [facility] Emergency Assistance [place] and/or Residential Mobile Crisis Unit telephone number  Request made of family/significant other to:  Remove weapons (e.g., guns, rifles, knives), all items previously/currently identified as safety concern.    Remove drugs/medications (over-the-counter, prescriptions, illicit drugs), all items previously/currently identified as a safety concern.  The family member/significant other verbalizes understanding of the suicide prevention education information provided.  The family member/significant other agrees to remove the items of safety concern listed above.  Bo Mcclintock 02/16/2015, 12:49 PM

## 2015-02-16 NOTE — Progress Notes (Signed)
Patient ID: Nicole Wallace, female   DOB: 1980/12/26, 34 y.o.   MRN: [id number]  Pt currently presents with a animated affect and impulsive behavior. Per self inventory, pt rates depression at a 3, hopelessness 1 and anxiety 1. Pt's daily goal is to "mentally prepare to leave" and they intend to do so by "talking" Pt reports fair sleep, a fair appetite, low energy and good concentration. Pt speech is rapid. Pt main concern is "what I will do if my boyfriend and I get into it or he ends up not really caring." Pt tearful when discussing last nights visit. Pt encouraged to speak with SW about fears and brainstorm ideas for a support/contact person for times of distress.   Pt provided with medications per providers orders. Pt's labs and vitals were monitored throughout the day. Pt supported emotionally and encouraged to express concerns and questions. Pt educated on medications.  Pt's safety ensured with 15 minute and environmental checks. Pt currently denies SI/HI and A/V hallucinations. Pt verbally agrees to seek staff if SI/HI or A/VH occurs and to consult with staff before acting on these thoughts. Will continue POC.

## 2015-02-16 NOTE — Tx Team (Signed)
Interdisciplinary Treatment Plan Update (Adult) Date: 02/16/2015   Date: 02/16/2015 1:49 PM  Progress in Treatment:  Attending groups: Yes  Participating in groups: Yes  Taking medication as prescribed: Yes  Tolerating medication: Yes  Family/Significant othe contact made: Yes, with father Patient understands diagnosis: Yes Discussing patient identified problems/goals with staff: Yes  Medical problems stabilized or resolved: Yes  Denies suicidal/homicidal ideation: Yes Patient has not harmed self or Others: Yes   New problem(s) identified: None identified at this time.   Discharge Plan or Barriers: Pt will return home and follow-up with outpatient providers  Additional comments: n/a   Reason for Continuation of Hospitalization:  Depression Medication stabilization Suicidal ideation  Estimated length of stay: 3-5 days  Review of initial/current patient goals per problem list:   1.  Goal(s): Patient will participate in aftercare plan  Met:  Yes  Target date: 3-5 days from date of admission   As evidenced by: Patient will participate within aftercare plan AEB aftercare provider and housing plan at discharge being identified.   02/14/15: Pt will return home and follow-up with outpatient providers.  2.  Goal (s): Patient will exhibit decreased depressive symptoms and suicidal ideations.  Met:  Yes  Target date: 3-5 days from date of admission   As evidenced by: Patient will utilize self rating of depression at 3 or below and demonstrate decreased signs of depression or be deemed stable for discharge by MD.  02/14/15: Pt reports improving mood and is attending groups; Pt less irritable and has improving affect.  02/16/15: Pt rates depression at 3/10; denies SI   Attendees:  Patient:    Family:    Physician: Dr. Parke Poisson, MD  02/16/2015 1:49 PM  Nursing: Lars Pinks, RN Case manager  02/16/2015 1:49 PM  Clinical Social Worker Norman Clay, MSW 02/16/2015 1:49 PM   Other: Lucinda Dell, Beverly Sessions Liasion 02/16/2015 1:49 PM  Clinical: Marcella Dubs, RN 02/16/2015 1:49 PM  Other: , RN Charge Nurse 02/16/2015 1:49 PM  Other:     Peri Maris, [place] MSW

## 2015-02-16 NOTE — BHH Group Notes (Signed)
[facility] Mental [facility] Association Group Therapy 02/16/2015 1:15pm  Type of Therapy: Mental Health Association Presentation  Participation Level: Active  Participation Quality: Attentive  Affect: Appropriate  Cognitive: Oriented  Insight: Developing/Improving  Engagement in Therapy: Engaged  Modes of Intervention: Discussion, Education and Socialization  Summary of Progress/Problems: Mental Health Association ([place]) Speaker came to talk about his personal journey with substance abuse and addiction. The pt processed ways by which to relate to the speaker. [place] speaker provided handouts and educational information pertaining to groups and services offered by the [facility]. Pt was engaged in speaker's presentation and was receptive to resources provided.    Peri Maris, [place] 02/16/2015 1:49 PM

## 2015-02-16 NOTE — Progress Notes (Signed)
Patient ID: Nicole Wallace, female   DOB: 1980-11-19, 34 y.o.   MRN: [id number] [facility] MD Progress Note  02/16/2015 2:02 PM Nicole Wallace  MRN:  [id number] Subjective:     Patient states that she has been feeling better, but that today she has again felt more depressed in the context of a visit from her boyfriend last night . Denies medication side effects .  Objective :I have discussed case with treatment team and have met with patient. Patient reports she had a visit from her boyfriend last evening, and that he made a statement " I came to visit you because you are such a good friend". Patient states she slept poorly last night ruminating about this statement and whether it was in effect a termination, essentially saying they were now only friends, or a statement that in addition to being boyfriend he also considers her closest friend. Patient states she " thought about it for hours last night, and it made me feel more depressed ". Patient  Feels that increased tendency to ruminate /worry at night time may have been related to recent Valium dose taper . At this time is feeling better but states " i thought I was going to be ready for discharge today, but now I am not sure, I think I need a little more time". On unit she has been noted to be interactive with peers , pleasant, at times needing some redirection from staff due to tendency to monopolize groups at times  Or slight  boundary issues, such as giving unsolicited advice feedback to other patients .  Of note, she has not been agitated or disruptive, and presents pleasant and cooperative. At this time there are no symptoms suggestive of mania or hypomania.  She is tolerating medications well and denies side effects.  She remains future oriented , planning to return to work soon, thinking of when she is going to have her car's oil changed, etc. .  Principal Problem: MDD (major depressive disorder), recurrent episode, severe ([place]) Diagnosis:    Patient Active Problem List   Diagnosis Date Noted  . MDD (major depressive disorder), recurrent episode, severe ([place]) [F33.2] 02/12/2015  . Migraine [G43.909] 04/13/2012  . Fibroids [D25.9] 04/13/2012  . Anxiety [F41.9] 04/13/2012   Total Time spent with patient: 20 minutes    Past Medical History:  Past Medical History  Diagnosis Date  . Migraine   . Fibroid uterus   . Duodenitis     mild  . Allergy   . Heart murmur   . LSIL (low grade squamous intraepithelial lesion) on Pap smear     colposcopy 03/2010; normal paps after  . 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 Depression   . Anxiety     Past Surgical History  Procedure Laterality Date  . Colonic polyp removed   05/14/2007    non-malignant  . Intrauterine device insertion  06/21/2013    Mirena   Family History:  Family History  Problem Relation Age of Onset  . Asthma Mother   . Hypertension Father   . Hypertension Sister     cardiomegaly  . Hyperlipidemia Sister   . Asthma Sister   . Scoliosis Sister   . Fibromyalgia Sister   . Heart disease Sister 59    "mild heart attack"  . Arthritis Maternal Grandmother   . Heart disease Maternal Grandmother   . Kidney disease Maternal Grandmother   . Hyperlipidemia Maternal Grandmother   . Heart disease Maternal Grandfather   . Hypertension  Maternal Grandfather   . Hyperlipidemia Maternal Grandfather   . Cancer Paternal Grandmother     lung  . Aneurysm Paternal Grandfather   . Asthma Maternal Aunt   . Diabetes Maternal Aunt   . Heart disease Maternal Aunt     Social History:  History  Alcohol Use  . 0.0 - 0.6 oz/week  . 0-1 Standard drinks or equivalent per week    Comment: one alcohol drink every 2 weeks socially     History  Drug Use No    Comment: Patient denies     Social History   Social History  . Marital Status: Married    Spouse Name: estranged  . Number of Children: 0  . Years of Education: 15   Occupational History  . Support Rep 2 [employer]     [place]   Social History Main Topics  . Smoking status: Never Smoker   . Smokeless tobacco: Never Used  . Alcohol Use: 0.0 - 0.6 oz/week    0-1 Standard drinks or equivalent per week     Comment: one alcohol drink every 2 weeks socially  . Drug Use: No     Comment: Patient denies   . Sexual Activity:    Partners: Male    Birth Control/ Protection: IUD     Comment: Mirena 06/21/2013-1st intercourse 34 yo-More than 5 partners   Other Topics Concern  . None   Social History Narrative   Separated from husband since 06/2010 (he lives in [place], [place]).  She lives alone. Her father lives very nearby in [place].   Additional Social History:    Pain Medications: ultram Prescriptions: valtrex   ultram   celexa   valium   multivitamin   B-12 Over the Counter: multivitamin History of alcohol / drug use?: Yes Longest period of sobriety (when/how long): several weeks between social drink Negative Consequences of Use: Personal relationships Withdrawal Symptoms: Other (Comment) (anxiety, social drink every several weeks) Name of Substance 1: alcohol 1 - Age of First Use: 34 yrs old 1 - Amount (size/oz): one social drink every few weeks 1 - Frequency: one social drink every few weeks 1 - Duration: since age 84 yrs old 1 - Last Use / Amount: one social drink every 2 weeks this past weekend     Sleep:  Fair   Appetite:  Good  Current Medications: Current Facility-Administered Medications  Medication Dose Route Frequency Provider Last Rate Last Dose  . acetaminophen (TYLENOL) tablet 650 mg  650 mg Oral Q6H PRN Niel Hummer, NP      . alum & mag hydroxide-simeth (MAALOX/MYLANTA) 200-200-20 MG/5ML suspension 30 mL  30 mL Oral Q4H PRN Niel Hummer, NP      . ARIPiprazole (ABILIFY) tablet 2 mg  2 mg Oral Daily Jenne Campus, MD   2 mg at 02/16/15 1031  . citalopram (CELEXA) tablet 40 mg  40 mg Oral Daily Nicholaus Bloom, MD   40 mg at 02/16/15 0758  . diazepam  (VALIUM) tablet 5 mg  5 mg Oral QHS Myer Peer Cobos, MD      . hydrOXYzine (ATARAX/VISTARIL) tablet 25 mg  25 mg Oral TID PRN Harriet Butte, NP   25 mg at 02/15/15 2010  . ibuprofen (ADVIL,MOTRIN) tablet 400 mg  400 mg Oral Q6H PRN Niel Hummer, NP   400 mg at 02/12/15 1649  . magnesium hydroxide (MILK OF MAGNESIA) suspension 30 mL  30 mL Oral Daily  PRN Niel Hummer, NP   30 mL at 02/13/15 1447  . multivitamin with minerals tablet 1 tablet  1 tablet Oral Daily Nicholaus Bloom, MD   1 tablet at 02/16/15 [phone]  . traMADol (ULTRAM) tablet 50 mg  50 mg Oral Q12H PRN Jenne Campus, MD      . traZODone (DESYREL) tablet 50 mg  50 mg Oral QHS PRN Niel Hummer, NP   50 mg at 02/15/15 2257  . valACYclovir (VALTREX) tablet 1,000 mg  1,000 mg Oral BID Nicholaus Bloom, MD   1,000 mg at 02/16/15 9622    Lab Results:  No results found for this or any previous visit (from the past 48 hour(s)).  Physical Findings: AIMS: Facial and Oral Movements Muscles of Facial Expression: None, normal Lips and Perioral Area: None, normal Jaw: None, normal Tongue: None, normal,Extremity Movements Upper (arms, wrists, hands, fingers): None, normal Lower (legs, knees, ankles, toes): None, normal, Trunk Movements Neck, shoulders, hips: None, normal, Overall Severity Severity of abnormal movements (highest score from questions above): None, normal Incapacitation due to abnormal movements: None, normal Patient's awareness of abnormal movements (rate only patient's report): No Awareness, Dental Status Current problems with teeth and/or dentures?: No Does patient usually wear dentures?: No  CIWA:  CIWA-Ar Total: 4 COWS:  COWS Total Score: 4  Musculoskeletal: Strength & Muscle Tone: within normal limits Gait & Station: normal Patient leans: N/A  Psychiatric Specialty Exam: ROS denies chest pain, denies shortness of breath, denies coughing, no   vomiting  , no  Nausea reported   Blood pressure 102/71, pulse 58,  temperature 98.4 F (36.9 C), temperature source Oral, resp. rate 16, height 5' 4"  (1.626 m), weight 141 lb (63.957 kg), last menstrual period 01/25/2015, SpO2 100 %.Body mass index is 24.19 kg/(m^2).  General Appearance: Well Groomed  Engineer, water::  Good  Speech:  Normal Rate  Volume:  Normal  Mood:   States feeling depressed today related to relationship stress, as above    Affect:  Appropriate and reactive   Thought Process:  Linear  Orientation:  Full (Time, Place, and Person)  Thought Content:  denies hallucinations, no delusions, not internally preoccupied,  ruminative about relationship stress   Suicidal Thoughts:  No at this time denies any SI or any self injurious ideations , also denies any thoughts of violence towards anyone   Homicidal Thoughts:  No  Memory:  recent and remote grossly intact   Judgement:  Other:  improving  Insight:  improving   Psychomotor Activity:  Normal  Concentration:  Good  Recall:  Good  Fund of Knowledge:Good  Language: Good  Akathisia:  Negative  Handed:  Right  AIMS (if indicated):     Assets:  Communication Skills Desire for Improvement Physical Health Resilience Social Support Vocational/Educational  ADL's:  Intact  Cognition: WNL  Sleep:  Number of Hours: 6  Assessment -   Patient presents improved compared to admission- she is presenting with full range of affect, and is not endorsing severe neuro-vegetative symptoms of depression. She states that she is feeling more depressed, due to wondering what boyfriend made by a remark he made about her being a good friend of his . She has been ruminative about this today, and states that she feels unready for discharge due to this .  Relationship stressors have been major issue for patient. She is tolerating medications well, denies side effects. She reports poor sleep and increased tendency to night time ruminations  with decreased Valium dose , and wants to return to prior dose .  Treatment Plan  Summary: Daily contact with patient to assess and evaluate symptoms and progress in treatment, Medication management, Plan inpatient admission and medications as below   Continue Celexa  40 mgrs QDAY for depression, anxiety Increase Valium  To 5  mgrs QHS for worsening  insomnia,  Night time anxiety as above  Continue Trazodone 50 mgrs QHS PRN for insomnia if  needed  Continue Vistaril 25 mgrs Q 6 hours PRN for anxiety Continue Ultram PRN Q 12 hours for severe pain if needed  Encourage group, milieu participation to work on coping skills, symptom reduction Consider discharge soon as she continues to improve - treatment team working on discharge options   COBOS, [place] 02/16/2015, 2:02 PM

## 2015-02-16 NOTE — Progress Notes (Signed)
D: Patient in her room with her father on first approach.  Patent came to talk to Probation officer after group.  Patient states she had a stressful day because she was not able to talk to her boyfriend during group time.  Patient still displaying intrusive and attention seeking behavior.  Patient states she is supposed to be discharged tomorrow.  Patient appears preoccupied with bowels and states she is constipated. Patient denies SI/HI and denies AVH.     A: Staff to monitor Q 15 mins for safety.  Encouragement and support offered.  Scheduled medications administered per orders.  Vistaril given prn for anxiety.  Milk of magnesia administered prn for constipation.  R: Patient remains safe on the unit.  Patient attended group tonight.  Patient visible on the unit and interacting with peers.  Patient taking administered medications.

## 2015-02-16 NOTE — Progress Notes (Signed)
Patient ID: [name], female   DOB: 20-Feb-1981, 34 y.o.   MRN: [id number] Adult Psychoeducational Group Note  Date:  02/16/2015 Time: 09:10  Group Topic/Focus:  Orientation:   The focus of this group is to educate the patient on the purpose and policies of crisis stabilization and provide a format to answer questions about their admission.  The group details unit policies and expectations of patients while admitted.  Participation Level:  Active  Participation Quality:  Appropriate  Affect:  Appropriate  Cognitive:  Appropriate  Insight: Improving  Engagement in Group:  Engaged  Modes of Intervention:  Education, Orientation and Support  Additional Comments:  Pt able to identify one daily goal to accomplish today.   [name] 02/16/2015, 10:21 AM

## 2015-02-17 MED ORDER — TRAZODONE HCL 50 MG PO TABS: 50.0000 mg | ORAL_TABLET | Freq: Every evening | ORAL | Status: DC | PRN

## 2015-02-17 MED ORDER — VALACYCLOVIR HCL 1 G PO TABS: 1000.0000 mg | ORAL_TABLET | Freq: Two times a day (BID) | ORAL | Status: DC

## 2015-02-17 MED ORDER — TRAMADOL HCL 50 MG PO TABS: ORAL_TABLET | ORAL | Status: DC

## 2015-02-17 MED ORDER — HYDROXYZINE HCL 25 MG PO TABS: 25.0000 mg | ORAL_TABLET | Freq: Three times a day (TID) | ORAL | Status: DC | PRN

## 2015-02-17 MED ORDER — ARIPIPRAZOLE 2 MG PO TABS: 2.0000 mg | ORAL_TABLET | Freq: Every day | ORAL | Status: DC

## 2015-02-17 MED ORDER — ADULT MULTIVITAMIN W/MINERALS CH: 1.0000 | ORAL_TABLET | Freq: Every day | ORAL | Status: DC

## 2015-02-17 MED ORDER — DIAZEPAM 10 MG PO TABS: 5.0000 mg | ORAL_TABLET | Freq: Every evening | ORAL | Status: DC | PRN

## 2015-02-17 MED ORDER — CITALOPRAM HYDROBROMIDE 40 MG PO TABS: 40.0000 mg | ORAL_TABLET | Freq: Every day | ORAL | Status: DC

## 2015-02-17 NOTE — Progress Notes (Signed)
D) Pt being discharged to home. Mood and affect appropriate. Pt denies SI and HI. A) Given support. All belongings returned to Pt. Pt given support, reassurance and praise. Follow up plans and medications explained to Pt. R) Denies SI and HI.

## 2015-02-17 NOTE — Discharge Summary (Signed)
Physician Discharge Summary Note  Patient:  Nicole Wallace is an 34 y.o., female MRN:  [id number] DOB:  02-27-1981 Patient phone:  [phone] (home)  Patient address:   [place] [address],  Total Time spent with patient: Greater than 30 minutes  Date of Admission:  02/12/2015  Date of Discharge: 02-17-15  Reason for Admission: Worsening symptoms of depression  Principal Problem: MDD (major depressive disorder), recurrent episode, severe [facility])  Discharge Diagnoses: Patient Active Problem List   Diagnosis Date Noted  . MDD (major depressive disorder), recurrent episode, severe ([place]) [F33.2] 02/12/2015  . Migraine [G43.909] 04/13/2012  . Fibroids [D25.9] 04/13/2012  . Anxiety [F41.9] 04/13/2012   Musculoskeletal: Strength & Muscle Tone: within normal limits Gait & Station: normal Patient leans: N/A  Psychiatric Specialty Exam: Physical Exam  Psychiatric: Her speech is normal and behavior is normal. Judgment and thought content normal. Her mood appears not anxious. Her affect is not angry, not blunt, not labile and not inappropriate. Cognition and memory are normal. She does not exhibit a depressed mood.    Review of Systems  Constitutional: Negative.   HENT: Negative.   Eyes: Negative.   Respiratory: Negative.   Cardiovascular: Negative.   Gastrointestinal: Negative.   Genitourinary: Negative.   Musculoskeletal: Negative.   Skin: Negative.   Neurological: Negative.   Endo/Heme/Allergies: Negative.   Psychiatric/Behavioral: Positive for depression (Stable). Negative for suicidal ideas, hallucinations, memory loss and substance abuse. The patient has insomnia (Stable). The patient is not nervous/anxious.     Blood pressure 104/66, pulse 67, temperature 99 F (37.2 C), temperature source Oral, resp. rate 16, height 5\' 4"  (1.626 m), weight 63.957 kg (141 lb), last menstrual period 01/25/2015, SpO2 100 %.Body mass index is 24.19 kg/(m^2).   See Md's SRA  Have you used any form of tobacco in the last 30 days? (Cigarettes, Smokeless Tobacco, Cigars, and/or Pipes): No  Has this patient used any form of tobacco in the last 30 days? (Cigarettes, Smokeless Tobacco, Cigars, and/or Pipes) No  Past Medical History:  Past Medical History  Diagnosis Date  . Migraine   . Fibroid uterus   . Duodenitis     mild  . Allergy   . Heart murmur   . LSIL (low grade squamous intraepithelial lesion) on Pap smear     colposcopy 03/2010; normal paps after  . 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 Depression   . Anxiety     Past Surgical History  Procedure Laterality Date  . Colonic polyp removed   05/14/2007    non-malignant  . Intrauterine device insertion  06/21/2013    Mirena   Family History:  Family History  Problem Relation Age of Onset  . Asthma Mother   . Hypertension Father   . Hypertension Sister     cardiomegaly  . Hyperlipidemia Sister   . Asthma Sister   . Scoliosis Sister   . Fibromyalgia Sister   . Heart disease Sister 52    "mild heart attack"  . Arthritis Maternal Grandmother   . Heart disease Maternal Grandmother   . Kidney disease Maternal Grandmother   . Hyperlipidemia Maternal Grandmother   . Heart disease Maternal Grandfather   . Hypertension Maternal Grandfather   . Hyperlipidemia Maternal Grandfather   . Cancer Paternal Grandmother     lung  . Aneurysm Paternal Grandfather   . Asthma Maternal Aunt   . Diabetes Maternal Aunt   . Heart disease Maternal Aunt    Social History:  History  Alcohol Use  . 0.0 - 0.6 oz/week  . 0-1 Standard drinks or equivalent per week    Comment: one alcohol drink every 2 weeks socially     History  Drug Use No    Comment: Patient denies     Social History   Social History  . Marital Status: Married    Spouse Name: estranged  . Number of Children: 0  . Years of Education: 15   Occupational History  . Support Rep 2 [employer]    [place]   Social  History Main Topics  . Smoking status: Never Smoker   . Smokeless tobacco: Never Used  . Alcohol Use: 0.0 - 0.6 oz/week    0-1 Standard drinks or equivalent per week     Comment: one alcohol drink every 2 weeks socially  . Drug Use: No     Comment: Patient denies   . Sexual Activity:    Partners: Male    Birth Control/ Protection: IUD     Comment: Mirena 06/21/2013-1st intercourse 34 yo-More than 5 partners   Other Topics Concern  . None   Social History Narrative   Separated from husband since 06/2010 (he lives in [place], [place]).  She lives alone. Her father lives very nearby in [place].   Risk to Self: Suicidal Ideation: Yes-Currently Present Suicidal Intent: Yes-Currently Present Is patient at risk for suicide?: Yes Suicidal Plan?: Yes-Currently Present Specify Current Suicidal Plan: Plan to overdose on pills Access to Means: Yes Specify Access to Suicidal Means: Access to pills What has been your use of drugs/alcohol within the last 12 months?: drinks socially  How many times?: 2 Triggers for Past Attempts: Unpredictable Intentional Self Injurious Behavior: None  Risk to Others: Homicidal Ideation: No Thoughts of Harm to Others: No Current Homicidal Intent: No Current Homicidal Plan: No Access to Homicidal Means: No Identified Victim: None History of harm to others?: No Assessment of Violence: None Noted Violent Behavior Description: Patient is calm and cooperative Does patient have access to weapons?: No Criminal Charges Pending?: No Does patient have a court date: No  Prior Inpatient Therapy: Prior Inpatient Therapy: No  Prior Outpatient Therapy: Prior Outpatient Therapy: Yes Prior Therapy Dates: Current Prior Therapy Facilty/Provider(s): [facility] Reason for Treatment: Depression Does patient have an ACCT team?: No Does patient have Intensive In-House Services?  : No Does patient have [facility] services? : No Does patient have P4CC services?:  No  Level of Care:  OP  Hospital Course:  34 Y/o female who states there is pain in her past. States she was drinking and state her BF self discloses and wanted her to do the same but she is not ready to do. He seems to have issues with trust and wanted her to talk about a message he had seen in her phone from another guy. States she did not want to do that either. She went trough a divorce and she was started on Celexa. It was recently increased to 40 mg. States she was givenValium for sleep. She also shares that she is still having a hard time as she was made to have an abortion at 89 and her best friend was killed in an accident.   Nicole Wallace was admitted to the hospital with her UDS test reports showing positive Benzodiazepine. She was on valium for symptoms of anxiety. However, her reason for admission was worsening symptoms of depression requiring mood stabilization treatments. After evaluation of her symptoms, Nicole Wallace was  started on medication regimen for her presenting symptoms. Her medication regimen included; Abilify 2 mg for mood control, Citalopram 40 mg for depression, Hydroxyzine 25 mg for anxiety & Trazodone 50 mg for insomnia. She was also enrolled & participated in the group counseling sessions being offered and held on this unit, she learned coping skills that should help her cope better and maintain mood stability after discharge. She presented other significant pre-existing health issues that required treatment and or monitoring. She was resumed on all her pertinent home medications for those health issues. She tolerated her treatment regimen without any significant adverse effects and or reactions.  Nicole Wallace's symptoms were evaluated on daily basis by a clinical provider to ascertain her symptoms are responding to her treatment regimen. This is evidenced by her reports of decreasing symptoms, improved mood, sleep & presentation of good affect. She is currently being discharged to continue  psychiatric treatment and medication management as noted below. She is provided with all the pertinent information required to make this appointment without problems.   On this day of her hospital discharge, Nicole Wallace is in much improved condition than upon admission. She contracted for her safety and felt more in control of hiermood. Her symptoms were reported as significantly decreased or resolved completely. She denies SI/HI and voiced no AVH. She is instructed & motivated to continue taking medications with a goal of continued improvement in mental health. She was picked up by his boyfriend. She left [facility] in no apparent distress with all belongings.  Consults:  psychiatry  Significant Diagnostic Studies:  labs: CBCwith diff, CMP, UDS, toxicology tests, U/A, results reviewed, stable  Discharge Vitals:   Blood pressure 104/66, pulse 67, temperature 99 F (37.2 C), temperature source Oral, resp. rate 16, height 5\' 4"  (1.626 m), weight 63.957 kg (141 lb), last menstrual period 01/25/2015, SpO2 100 %. Body mass index is 24.19 kg/(m^2). Lab Results:   No results found for this or any previous visit (from the past 72 hour(s)).  Physical Findings: AIMS: Facial and Oral Movements Muscles of Facial Expression: None, normal Lips and Perioral Area: None, normal Jaw: None, normal Tongue: None, normal,Extremity Movements Upper (arms, wrists, hands, fingers): None, normal Lower (legs, knees, ankles, toes): None, normal, Trunk Movements Neck, shoulders, hips: None, normal, Overall Severity Severity of abnormal movements (highest score from questions above): None, normal Incapacitation due to abnormal movements: None, normal Patient's awareness of abnormal movements (rate only patient's report): No Awareness, Dental Status Current problems with teeth and/or dentures?: No Does patient usually wear dentures?: No  CIWA:  CIWA-Ar Total: 4 COWS:  COWS Total Score: 4  See Psychiatric Specialty Exam and  Suicide Risk Assessment completed by Attending Physician prior to discharge.  Discharge destination:  Home  Is patient on multiple antipsychotic therapies at discharge:  No   Has Patient had three or more failed trials of antipsychotic monotherapy by history:  No  Recommended Plan for Multiple Antipsychotic Therapies: NA    Medication List    STOP taking these medications        cycloSPORINE 0.05 % ophthalmic emulsion  Commonly known as:  RESTASIS     GREEN TEA PO     vitamin B-12 1000 MCG tablet  Commonly known as:  CYANOCOBALAMIN      TAKE these medications      Indication   ARIPiprazole 2 MG tablet  Commonly known as:  ABILIFY  Take 1 tablet (2 mg total) by mouth daily. For mood control   Indication:  Mood control     citalopram 40 MG tablet  Commonly known as:  CELEXA  Take 1 tablet (40 mg total) by mouth daily. For depression   Indication:  Depression     diazepam 10 MG tablet  Commonly known as:  VALIUM  Take 0.5 tablets (5 mg total) by mouth at bedtime as needed for anxiety or sleep (insomnia).   Indication:  Feeling Anxious, Trouble Sleeping     hydrOXYzine 25 MG tablet  Commonly known as:  ATARAX/VISTARIL  Take 1 tablet (25 mg total) by mouth 3 (three) times daily as needed for anxiety.   Indication:  Anxiety     multivitamin with minerals Tabs tablet  Take 1 tablet by mouth daily. For low vitamin   Indication:  Low vitamin     traMADol 50 MG tablet  Commonly known as:  ULTRAM  Take 1 tablet (50 mg) every 12 hours: For pain   Indication:  Moderate to Moderately Severe Pain     traZODone 50 MG tablet  Commonly known as:  DESYREL  Take 1 tablet (50 mg total) by mouth at bedtime as needed for sleep.   Indication:  Trouble Sleeping     valACYclovir 1000 MG tablet  Commonly known as:  VALTREX  Take 1 tablet (1,000 mg total) by mouth 2 (two) times daily. For viral infection   Indication:  Viral infection           Follow-up Information     Follow up with [facility] On 03/09/2015.   Why:  at 1:00pm for therapy with Bart.   Contact information:   [place] [address] [phone] Fax: [phone]      Follow up with [facility] On 02/28/2015.   Why:  at 1:00pm for medication management.   Contact information:   [address] [place], [place] [address] [phone] Fax: [phone]     Follow-up recommendations: Activity:  As tolerated Diet: As recommended by your primary care doctor. Keep all scheduled follow-up appointments as recommended.    Comments: Take all your medications as prescribed by your mental healthcare provider. Report any adverse effects and or reactions from your medicines to your outpatient provider promptly. Patient is instructed and cautioned to not engage in alcohol and or illegal drug use while on prescription medicines. In the event of worsening symptoms, patient is instructed to call the crisis hotline, 911 and or go to the nearest ED for appropriate evaluation and treatment of symptoms. Follow-up with your primary care provider for your other medical issues, concerns and or health care needs.   Total Discharge Time: Greater than 30 minutes  Signed: Encarnacion Slates, PMHNP, FNP-BC 02/17/2015, 10:47 AM  Patient seen, Suicide Assessment Completed.  Disposition Plan Reviewed

## 2015-02-17 NOTE — Plan of Care (Signed)
Problem: Diagnosis: Increased Risk For Suicide Attempt Goal: STG-Patient Will Report Suicidal Feelings to Staff Outcome: Progressing Patient denies SI.  Patient verbally contracts for safety.

## 2015-02-17 NOTE — [facility] Suicide Risk Assessment (Signed)
[facility] Discharge Suicide Risk Assessment   Demographic Factors:  34 year old single female, employed   Total Time spent with patient: 30 minutes  Musculoskeletal: Strength & Muscle Tone: within normal limits Gait & Station: normal Patient leans: N/A  Psychiatric Specialty Exam: Physical Exam  ROS  Blood pressure 104/66, pulse 67, temperature 99 F (37.2 C), temperature source Oral, resp. rate 16, height 5\' 4"  (1.626 m), weight 141 lb (63.957 kg), last menstrual period 01/25/2015, SpO2 100 %.Body mass index is 24.19 kg/(m^2).  General Appearance: Well Groomed  Eye Contact::  Good  Speech:  Normal Rate409  Volume:  Normal  Mood:  improved, seems euthymic at this time  Affect:  Appropriate and Full Range  Thought Process:  Linear  Orientation:  Full (Time, Place, and Person)  Thought Content:  no delusions, no hallucinations, less ruminative about relationship issues   Suicidal Thoughts:  No denies any self injurious ideations, denies any SI, denies any thoughts of violence towards anyone   Homicidal Thoughts:  No  Memory:  recent and remote grossly intact   Judgement:  Other:  improved   Insight:  Fair  Psychomotor Activity:  Normal  Concentration:  Good  Recall:  Good  Fund of Knowledge:Good  Language: Good  Akathisia:  Negative  Handed:  Right  AIMS (if indicated):     Assets:  Communication Skills Desire for Improvement Physical Health Resilience Vocational/Educational  Sleep:  Number of Hours: 6  Cognition: WNL  ADL's:  Intact   Have you used any form of tobacco in the last 30 days? (Cigarettes, Smokeless Tobacco, Cigars, and/or Pipes): No  Has this patient used any form of tobacco in the last 30 days? (Cigarettes, Smokeless Tobacco, Cigars, and/or Pipes) No  Mental Status Per Nursing Assessment::   On Admission:  NA  Current Mental Status by Physician: At this time patient is improved compared to her admission presentation- mood is improved and today seems  euthymci, affect is appropriate, full in range, no thought disorder, no SI, no HI, no psychotic symptoms. At present less ruminative about relationship stressors .  Loss Factors:  relationship stressors, loss of friend / death of friend    Historical Factors: history of depression, anxiety  Risk Reduction Factors:   Sense of responsibility to family, Employed, Positive social support and Positive coping skills or problem solving skills  Continued Clinical Symptoms:   As noted, at this time patient significantly improved compared to prior to admission- she is currently presenting euthymic, with full range of affect, no thought disorder, no SI or HI, no psychotic symptoms, less ruminative about stressors/ relationship issues, tolerating current medications well.  Cognitive Features That Contribute To Risk:  No gross cognitive deficits noted upon discharge. Is alert , attentive, and oriented x 3   Suicide Risk:  Mild:  Suicidal ideation of limited frequency, intensity, duration, and specificity.  There are no identifiable plans, no associated intent, mild dysphoria and related symptoms, good self-control (both objective and subjective assessment), few other risk factors, and identifiable protective factors, including available and accessible social support.  Principal Problem: MDD (major depressive disorder), recurrent episode, severe [facility]) Discharge Diagnoses:  Patient Active Problem List   Diagnosis Date Noted  . MDD (major depressive disorder), recurrent episode, severe ([place]) [F33.2] 02/12/2015  . Migraine [G43.909] 04/13/2012  . Fibroids [D25.9] 04/13/2012  . Anxiety [F41.9] 04/13/2012    Follow-up Information    Follow up with [facility] On 03/09/2015.   Why:  at 1:00pm for  therapy with Bart.   Contact information:   [place] [address] [phone] Fax: [phone]      Follow up with [facility] On 02/28/2015.   Why:  at 1:00pm for  medication management.   Contact information:   [address] [place], [place] [address] [phone] Fax: [phone]      Plan Of Care/Follow-up recommendations:  Activity:  as tolerated Diet:  Regular Tests:  NA Other:  See below   Is patient on multiple antipsychotic therapies at discharge:  No   Has Patient had three or more failed trials of antipsychotic monotherapy by history:  No  Recommended Plan for Multiple Antipsychotic Therapies: NA Patient is leaving unit in good spirits  Plans to return home  Plans to return to work next week  Follow up as above . ( ongoing psychotherapy at [facility] )    COBOS, [place] 02/17/2015, 1:27 PM

## 2015-02-17 NOTE — [facility] Group Notes (Signed)
[facility] LCSW Aftercare Discharge Planning Group Note  02/17/2015 8:45 AM  Participation Quality: Alert, Appropriate and Oriented  Mood/Affect: Appropriate  Depression Rating: 2  Anxiety Rating: 2  Thoughts of Suicide: "The day is still young, and nobody has pissed me off yet so no thoughts of hurting nobody"  Will you contract for safety? Yes  Current AVH: Pt denies  Plan for Discharge/Comments: Pt attended discharge planning group and actively participated in group. CSW discussed suicide prevention education with the group and encouraged them to discuss discharge planning and any relevant barriers. Pt continues to be attention seeking in group, interrupting peers with off topic comments. Pt reports that she is ready to go home.   Transportation Means: Pt reports access to transportation  Supports: No supports mentioned at this time  Peri Maris, [place] 02/17/2015 9:30 AM

## 2015-02-17 NOTE — Progress Notes (Signed)
  [facility] Adult Case Management Discharge Plan :  Will you be returning to the same living situation after discharge:  Yes,  Pt returning home At discharge, do you have transportation home?: Yes,  Pt boyfriend to pick up Do you have the ability to pay for your medications: Yes,  Pt provided with prescriptions  Release of information consent forms completed and in the chart;  Patient's signature needed at discharge.  Patient to Follow up at: Follow-up Information    Follow up with [facility] On 03/09/2015.   Why:  at 1:00pm for therapy with Bart.   Contact information:   [place] [address] [phone] Fax: [phone]      Follow up with [facility] On 02/28/2015.   Why:  at 1:00pm for medication management.   Contact information:   [address] [place], [place] [address] [phone] Fax: [phone]      Patient denies SI/HI: Yes,  Pt denies    Safety Planning and Suicide Prevention discussed: Yes,  with father; see SPE note for further details  Have you used any form of tobacco in the last 30 days? (Cigarettes, Smokeless Tobacco, Cigars, and/or Pipes): No  Has patient been referred to the Quitline?: N/A patient is not a smoker  Bo Mcclintock 02/17/2015, 1:16 PM

## 2015-02-21 ENCOUNTER — Encounter: Payer: Self-pay | Admitting: Physician Assistant

## 2015-02-22 ENCOUNTER — Telehealth: Payer: Self-pay | Admitting: Physician Assistant

## 2015-02-22 NOTE — Telephone Encounter (Signed)
Patient has an appointment with [name] on 02/28/2015 at 1pm. She wants to know if she can change her time to later in the afternoon. Scheduling can this be done?  [phone]

## 2015-02-27 IMAGING — CR DG CHEST 2V
2 series · 2 of 2 positions shown · non-contrast
Comparison: None.

CLINICAL DATA: Shortness of breath

EXAM:
CHEST  2 VIEW

[PA]
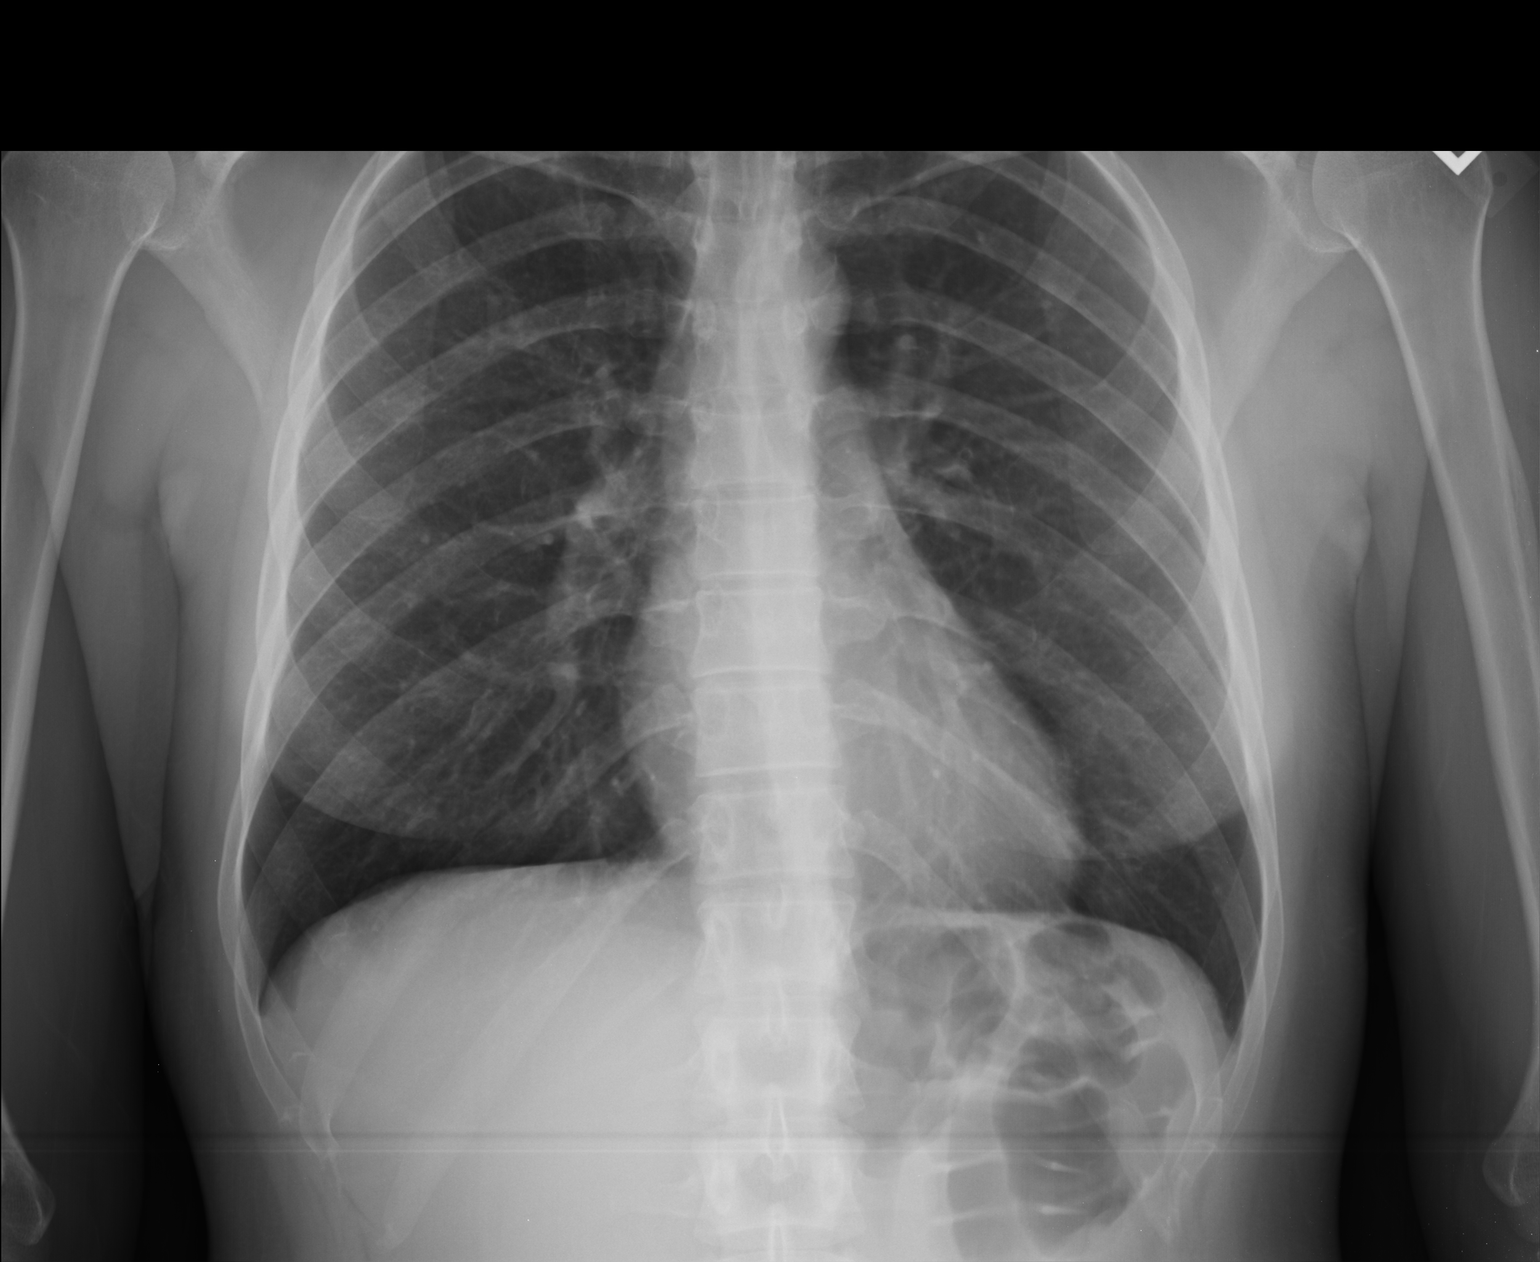

[lateral]
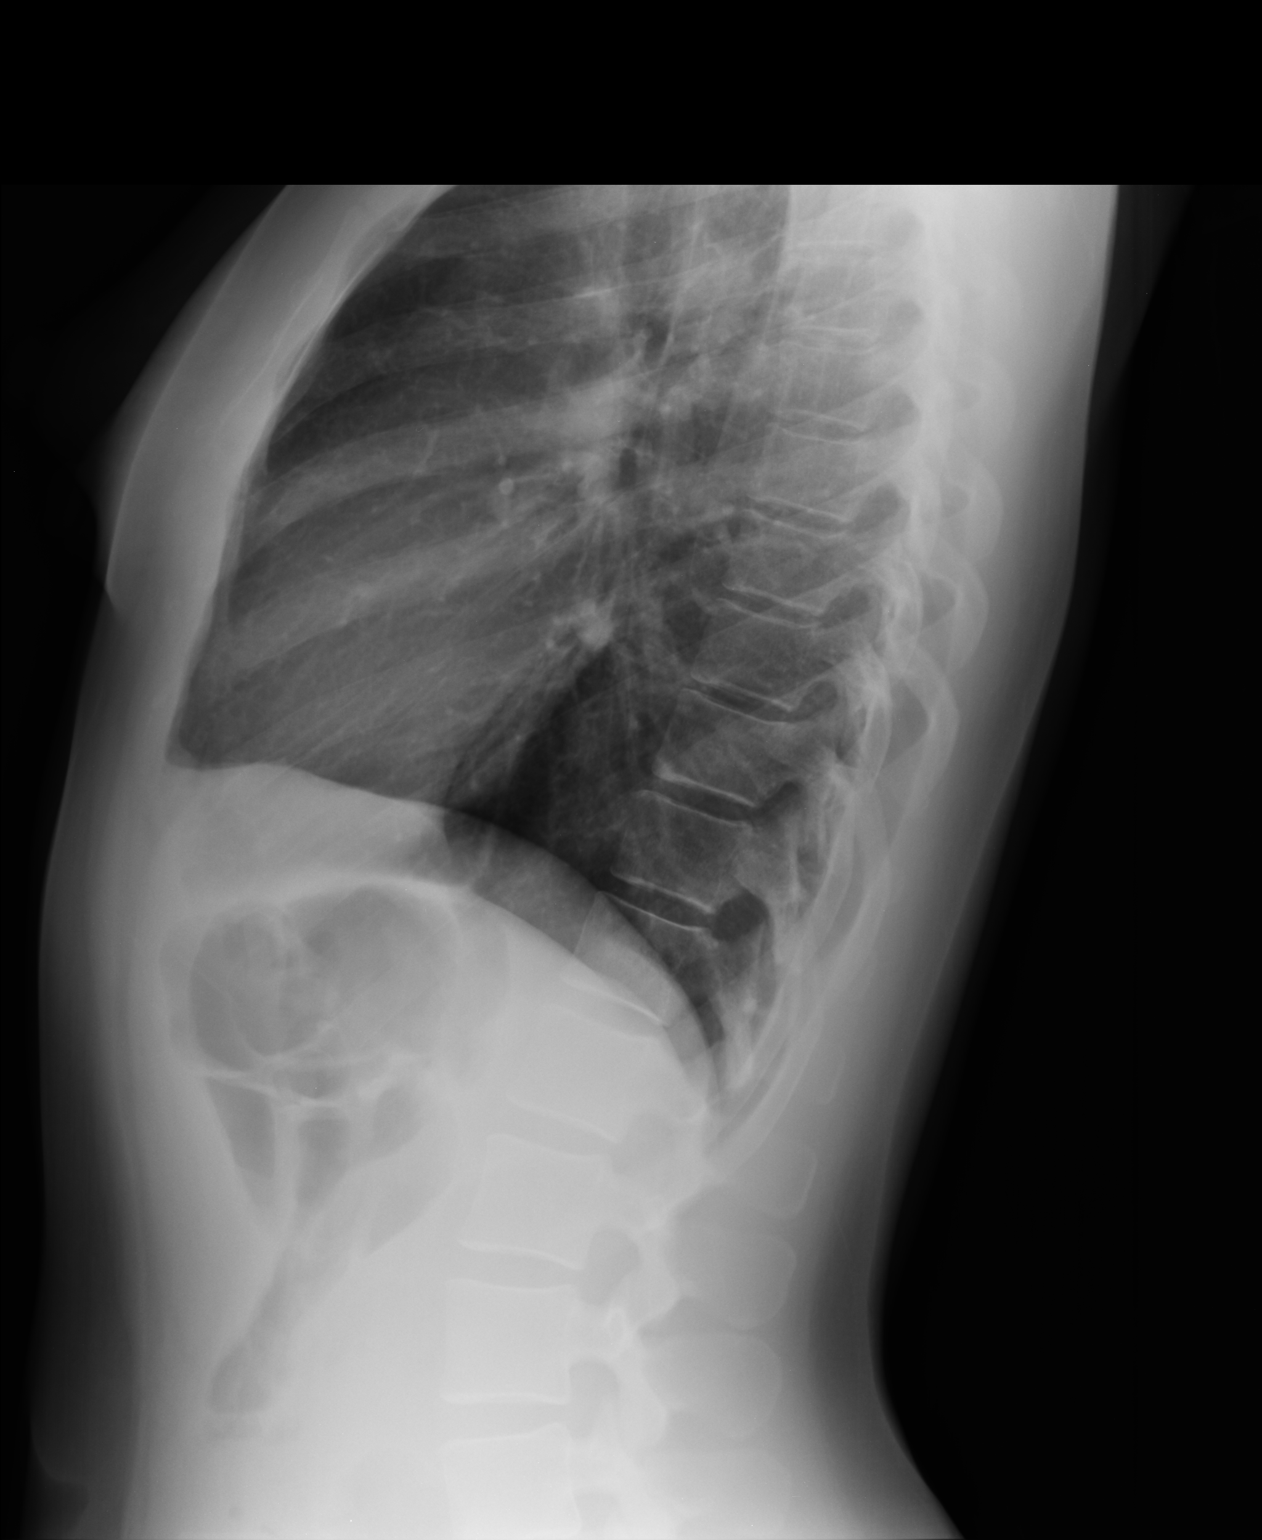

[2 of 2 positions shown; findings below may reference images not displayed]

FINDINGS: The heart size and mediastinal contours are within normal limits.
Both lungs are clear. The visualized skeletal structures are
unremarkable.
IMPRESSION: No active cardiopulmonary disease.

## 2015-02-28 ENCOUNTER — Ambulatory Visit: Payer: 59 | Admitting: Physician Assistant

## 2015-03-01 ENCOUNTER — Ambulatory Visit (INDEPENDENT_AMBULATORY_CARE_PROVIDER_SITE_OTHER): Payer: 59 | Admitting: Physician Assistant

## 2015-03-01 VITALS — BP 113/76 | HR 59 | Temp 99.1°F | Resp 16 | Ht 64.0 in | Wt 140.0 lb

## 2015-03-01 DIAGNOSIS — K429 Umbilical hernia without obstruction or gangrene: Secondary | ICD-10-CM | POA: Diagnosis not present

## 2015-03-01 DIAGNOSIS — Z23 Encounter for immunization: Secondary | ICD-10-CM

## 2015-03-01 DIAGNOSIS — F332 Major depressive disorder, recurrent severe without psychotic features: Secondary | ICD-10-CM

## 2015-03-01 NOTE — Patient Instructions (Signed)
Take the trazodone when you are ready to go to sleep. During the day, when you feel anxious take the Vistaril. If the Vistaril isn't enough, take the Valium.
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
## 2015-03-02 ENCOUNTER — Telehealth: Payer: Self-pay

## 2015-03-02 NOTE — Telephone Encounter (Signed)
[employer] faxed over forms to be completed by [name] for this patient. I have filled out what I can from the [facility] notes and highlighted what needs to be finished. Please complete and return to the FMLA/Disability box at 102 checkout desk within the 5-7 business days. Thank you! Placing in your box today 03/02/15

## 2015-03-03 NOTE — Telephone Encounter (Signed)
Form completed and returned to box at checkout desk.

## 2015-03-03 NOTE — Telephone Encounter (Signed)
Forms received, scanned and faxed to [employer].

## 2015-03-08 ENCOUNTER — Other Ambulatory Visit: Payer: Self-pay | Admitting: Gynecology

## 2015-03-08 DIAGNOSIS — N83202 Unspecified ovarian cyst, left side: Secondary | ICD-10-CM

## 2015-03-09 ENCOUNTER — Telehealth: Payer: Self-pay

## 2015-03-09 NOTE — Telephone Encounter (Signed)
Nicole Wallace at [facility] returned your call.   Please call  [phone]  Ext [phone]

## 2015-03-10 ENCOUNTER — Ambulatory Visit: Payer: 59

## 2015-03-10 ENCOUNTER — Encounter (HOSPITAL_COMMUNITY): Payer: Self-pay | Admitting: Psychiatry

## 2015-03-10 ENCOUNTER — Ambulatory Visit (INDEPENDENT_AMBULATORY_CARE_PROVIDER_SITE_OTHER): Payer: 59 | Admitting: Psychiatry

## 2015-03-10 ENCOUNTER — Ambulatory Visit (INDEPENDENT_AMBULATORY_CARE_PROVIDER_SITE_OTHER): Payer: 59 | Admitting: Physician Assistant

## 2015-03-10 VITALS — BP 106/69 | HR 62 | Temp 99.3°F | Ht 65.0 in | Wt 139.2 lb

## 2015-03-10 DIAGNOSIS — F1010 Alcohol abuse, uncomplicated: Secondary | ICD-10-CM | POA: Diagnosis not present

## 2015-03-10 DIAGNOSIS — F331 Major depressive disorder, recurrent, moderate: Secondary | ICD-10-CM | POA: Diagnosis not present

## 2015-03-10 DIAGNOSIS — Z23 Encounter for immunization: Secondary | ICD-10-CM

## 2015-03-10 MED ORDER — ARIPIPRAZOLE 5 MG PO TABS: 5.0000 mg | ORAL_TABLET | Freq: Every day | ORAL | Status: DC

## 2015-03-10 MED ORDER — TRAZODONE HCL 50 MG PO TABS: 50.0000 mg | ORAL_TABLET | Freq: Every evening | ORAL | Status: DC | PRN

## 2015-03-10 NOTE — Progress Notes (Signed)
   Subjective:    Patient ID: Nicole Wallace, female    DOB: February 05, 1981, 34 y.o.   MRN: [id number]  HPI    Review of Systems     Objective:   Physical Exam        Assessment & Plan:
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
## 2015-03-12 NOTE — Telephone Encounter (Signed)
I didn't call [employer]. Please call the number listed and get the information.

## 2015-03-14 ENCOUNTER — Ambulatory Visit (INDEPENDENT_AMBULATORY_CARE_PROVIDER_SITE_OTHER): Payer: 59 | Admitting: Physician Assistant

## 2015-03-14 VITALS — BP 113/77 | HR 55 | Temp 98.5°F | Resp 16 | Ht 65.0 in | Wt 139.0 lb

## 2015-03-14 DIAGNOSIS — F332 Major depressive disorder, recurrent severe without psychotic features: Secondary | ICD-10-CM

## 2015-03-14 DIAGNOSIS — Z114 Encounter for screening for human immunodeficiency virus [HIV]: Secondary | ICD-10-CM

## 2015-03-14 NOTE — Telephone Encounter (Signed)
The patient was seen today. She called [employer], not [place]. They need the office notes from her visits with me since her hospital discharge, 10/19 and 11/01. Please send them to fax [phone], attn DONNA. Claim #[id number]

## 2015-03-14 NOTE — Progress Notes (Signed)
Patient ID: Nicole [facility], female    DOB: [birth year], 34 y.o.   MRN: [id number]  PCP: Corrie Reder, PA-C  Subjective:   Chief Complaint  Patient presents with  . Follow-up  . Depression    HPI Presents for evaluation of depression and return to work evaluation.  She is currently working 30 hours/week.  "Hien Perreira, we have a problem.  My speech is like I've had a stroke." "If I'm not using words that I normally use, like everyday at work, I can't find the word, and I stutter." Finding herself irritable and agitated. "My nerves get so bad." Vistaril helps, but doesn't completely resolve the anxiety.  No weakness of the extremities. No facial asymmetry or slurred speech. No confusion. No falls. No dropping things. No loss of bowel or bladder control.  At her psychiatric visit last week, the Abilify dose was increased, and she read that it could cause speech delay and stuttering. At that visit, Dr. 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  So far she is tolerating the medications well. She has had one thought of suicide, of hanging herself, but says that she would never do that-it's too hard ("I mean, I'd have to figure out how to do it, and that's just too hard) and she doesn't have the "guts". She also reports that she has "too much sense" to "crash my car, or something like that. I love my car."    Review of Systems  Constitutional: Positive for fatigue. Negative for fever, chills and diaphoresis.  HENT: Negative.   Eyes: Negative for pain and visual disturbance.  Respiratory: Negative for cough, chest tightness and shortness of breath.   Cardiovascular: Negative for chest pain, palpitations and leg swelling.  Gastrointestinal: Negative for nausea, vomiting, diarrhea and constipation.       Patient Active Problem List   Diagnosis Date Noted  . Umbilical hernia 81/77/1165  . Severe episode of recurrent major depressive disorder, without psychotic  features ([place])   . MDD (major depressive disorder), recurrent episode, severe ([place]) 02/12/2015  . Migraine 04/13/2012  . Fibroids 04/13/2012  . Anxiety 04/13/2012     Prior to Admission medications   Medication Sig Start Date End Date Taking? Authorizing Provider  ARIPiprazole (ABILIFY) 5 MG tablet Take 1 tablet (5 mg total) by mouth daily. For mood control 03/10/15  Yes [name], MD  citalopram (CELEXA) 40 MG tablet Take 1 tablet (40 mg total) by mouth daily. For depression 02/17/15  Yes Encarnacion Slates, NP  diazepam (VALIUM) 10 MG tablet Take 0.5 tablets (5 mg total) by mouth at bedtime as needed for anxiety or sleep (insomnia). 02/17/15  Yes Encarnacion Slates, NP  hydrOXYzine (ATARAX/VISTARIL) 25 MG tablet Take 1 tablet (25 mg total) by mouth 3 (three) times daily as needed for anxiety. 02/17/15  Yes Encarnacion Slates, NP  Multiple Vitamin (MULTIVITAMIN WITH MINERALS) TABS tablet Take 1 tablet by mouth daily. For low vitamin 02/17/15  Yes Encarnacion Slates, NP  traMADol (ULTRAM) 50 MG tablet Take 1 tablet (50 mg) every 12 hours: For pain 02/17/15  Yes Encarnacion Slates, NP  traZODone (DESYREL) 50 MG tablet Take 1 tablet (50 mg total) by mouth at bedtime as needed for sleep. 03/10/15  Yes [name], MD  valACYclovir (VALTREX) 1000 MG tablet Take 1 tablet (1,000 mg total) by mouth 2 (two) times daily. For viral infection 02/17/15  Yes Encarnacion Slates, NP     Allergies  Allergen  Reactions  . Adhesive [Tape] Other (See Comments)    Causes red marks  . Wool Alcohol [Lanolin] Rash       Objective:  Physical Exam  Constitutional: She is oriented to person, place, and time. Vital signs are normal. She appears well-developed and well-nourished. She is active and cooperative. No distress.  BP 113/77 mmHg  Pulse 55  Temp(Src) 98.5 F (36.9 C)  Resp 16  Ht 5\' 5"  (1.651 m)  Wt 139 lb (63.05 kg)  BMI 23.13 kg/m2  HENT:  Head: Normocephalic and atraumatic.  Right Ear: Hearing normal.  Left Ear: Hearing  normal.  Eyes: Conjunctivae are normal. No scleral icterus.  Neck: Normal range of motion. Neck supple. No thyromegaly present.  Cardiovascular: Normal rate, regular rhythm and normal heart sounds.   Pulses:      Radial pulses are 2+ on the right side, and 2+ on the left side.  Pulmonary/Chest: Effort normal and breath sounds normal.  Lymphadenopathy:       Head (right side): No tonsillar, no preauricular, no posterior auricular and no occipital adenopathy present.       Head (left side): No tonsillar, no preauricular, no posterior auricular and no occipital adenopathy present.    She has no cervical adenopathy.       Right: No supraclavicular adenopathy present.       Left: No supraclavicular adenopathy present.  Neurological: She is alert and oriented to person, place, and time. She has normal strength. No cranial nerve deficit or sensory deficit. She displays a negative Romberg sign.  Skin: Skin is warm, dry and intact. No rash noted. No cyanosis or erythema. Nails show no clubbing.  Psychiatric: Her mood appears anxious. Her affect is not angry, not blunt (affect is a little flat, but she smiles appropriately.), not labile and not inappropriate. Her speech is not rapid and/or pressured, not delayed, not tangential and not slurred. She is not agitated, not aggressive, not hyperactive, not slowed, not withdrawn, not actively hallucinating and not combative. Thought content is not paranoid and not delusional. Cognition and memory are not impaired. She does not express impulsivity or inappropriate judgment. She does not exhibit a depressed mood. She expresses no homicidal and no suicidal (occastional ideation) ideation. She expresses no suicidal plans and no homicidal plans. She is communicative. She is attentive.           Assessment & Plan:   1. Severe episode of recurrent major depressive disorder, without psychotic features ([place]) Possible bipolar disorder. Continue current treatment,  medications and psychotherapy. Continue reduced work schedule for now. Re-evaluate on 11/22, after her next visits with Dr. Adele Schilder.  2. Screening for HIV (human immunodeficiency virus) - HIV antibody   Fara Chute, PA-C Physician Assistant-Certified [facility]

## 2015-03-14 NOTE — Telephone Encounter (Signed)
Doesn't look like ov notes from today are finished. Medical records can send records once documentation is complete.

## 2015-03-15 ENCOUNTER — Encounter: Payer: Self-pay | Admitting: Physician Assistant

## 2015-03-15 LAB — HIV ANTIBODY (ROUTINE TESTING W REFLEX): HIV: NONREACTIVE

## 2015-03-15 NOTE — Telephone Encounter (Signed)
Spoke with Jasmine in disability dept. She has already sent DOS 03/01/15. She will also send DOS 03/14/15 since documentation is now complete.

## 2015-03-15 NOTE — Telephone Encounter (Signed)
Did it.

## 2015-03-17 ENCOUNTER — Encounter: Payer: Self-pay | Admitting: Physician Assistant

## 2015-03-21 ENCOUNTER — Telehealth: Payer: Self-pay | Admitting: Physician Assistant

## 2015-03-21 NOTE — Telephone Encounter (Signed)
Spoke with Mr. Nicole Wallace. Patient gave him Leonard name as her psychiatrist. He needs her to establish with a long-term psychotherapy provider, as he can only provide temporary counseling through the EAP. Contact information for Dr. Adele Schilder provided.

## 2015-03-22 ENCOUNTER — Other Ambulatory Visit: Payer: 59

## 2015-03-22 ENCOUNTER — Ambulatory Visit: Payer: 59 | Admitting: Gynecology

## 2015-04-03 ENCOUNTER — Ambulatory Visit (HOSPITAL_COMMUNITY): Payer: Self-pay | Admitting: Psychiatry

## 2015-04-03 ENCOUNTER — Encounter (HOSPITAL_COMMUNITY): Payer: Self-pay | Admitting: Psychiatry

## 2015-04-03 ENCOUNTER — Ambulatory Visit (INDEPENDENT_AMBULATORY_CARE_PROVIDER_SITE_OTHER): Payer: 59 | Admitting: Psychiatry

## 2015-04-03 VITALS — BP 109/70 | HR 88 | Ht 65.0 in | Wt 144.0 lb

## 2015-04-03 DIAGNOSIS — F332 Major depressive disorder, recurrent severe without psychotic features: Secondary | ICD-10-CM

## 2015-04-03 DIAGNOSIS — F419 Anxiety disorder, unspecified: Secondary | ICD-10-CM

## 2015-04-03 DIAGNOSIS — F331 Major depressive disorder, recurrent, moderate: Secondary | ICD-10-CM

## 2015-04-03 MED ORDER — TRAZODONE HCL 50 MG PO TABS: 50.0000 mg | ORAL_TABLET | Freq: Every day | ORAL | Status: DC

## 2015-04-03 MED ORDER — HYDROXYZINE HCL 25 MG PO TABS: 25.0000 mg | ORAL_TABLET | Freq: Three times a day (TID) | ORAL | Status: DC | PRN

## 2015-04-03 MED ORDER — BENZTROPINE MESYLATE 0.5 MG PO TABS: 1.0000 mg | ORAL_TABLET | Freq: Two times a day (BID) | ORAL | Status: DC

## 2015-04-03 MED ORDER — CITALOPRAM HYDROBROMIDE 40 MG PO TABS: 40.0000 mg | ORAL_TABLET | Freq: Every day | ORAL | Status: DC

## 2015-04-03 MED ORDER — ARIPIPRAZOLE 5 MG PO TABS: 5.0000 mg | ORAL_TABLET | Freq: Every day | ORAL | Status: DC
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
## 2015-04-04 ENCOUNTER — Ambulatory Visit (INDEPENDENT_AMBULATORY_CARE_PROVIDER_SITE_OTHER): Payer: 59 | Admitting: Physician Assistant

## 2015-04-04 ENCOUNTER — Encounter: Payer: Self-pay | Admitting: Physician Assistant

## 2015-04-04 VITALS — BP 106/62 | HR 71 | Temp 98.9°F | Resp 16 | Wt 142.2 lb

## 2015-04-04 DIAGNOSIS — N644 Mastodynia: Secondary | ICD-10-CM

## 2015-04-04 DIAGNOSIS — F332 Major depressive disorder, recurrent severe without psychotic features: Secondary | ICD-10-CM

## 2015-04-04 DIAGNOSIS — G4700 Insomnia, unspecified: Secondary | ICD-10-CM | POA: Diagnosis not present

## 2015-04-04 DIAGNOSIS — F439 Reaction to severe stress, unspecified: Secondary | ICD-10-CM

## 2015-04-04 DIAGNOSIS — Z658 Other specified problems related to psychosocial circumstances: Secondary | ICD-10-CM

## 2015-04-04 NOTE — Progress Notes (Signed)
Patient ID: Nicole Wallace, female    DOB: [birth year], 34 y.o.   MRN: [id number]  PCP: Kaiana Marion, PA-C  Subjective:   Chief Complaint  Patient presents with  . Follow-up  . depressive disorder  . right breast pain    x 2 days    HPI Presents for evaluation of depressive disorder.   Thinks her work is trying to fire her. Have banned her from using her cell phone and computer, due to reduced productivity. Other employees are allowed to use their phone and use the Internet to look at [facility], which is frustrating to her. Her parents call her frequently to make sure she's doing ok. She reports that the reduced productivity is due to the medications she's taking that she is trying to adjust to. She's been written up, so cannot transfer within the health system. 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  At her last psychiatry visit, the provider added cogentin, but she hasn't picked it up yet.  RIGHT breast pain x 2 days. Did a self-breast exam but wasn't sure what she was feeling. No trauma or injury. No swelling. No nipple discharge.     Review of Systems As above.    Patient Active Problem List   Diagnosis Date Noted  . Umbilical hernia [id number]  . Severe episode of recurrent major depressive disorder, without psychotic features ([place])   . MDD (major depressive disorder), recurrent episode, severe ([place]) 02/12/2015  . Migraine 04/13/2012  . Fibroids 04/13/2012  . Anxiety 04/13/2012     Prior to Admission medications   Medication Sig Start Date End Date Taking? Authorizing Provider  ARIPiprazole (ABILIFY) 5 MG tablet Take 1 tablet (5 mg total) by mouth daily. For mood control 04/03/15  Yes Leonides Grills, MD  benztropine (COGENTIN) 0.5 MG tablet Take 2 tablets (1 mg total) by mouth 2 (two) times daily. 04/03/15 04/02/16 Yes Leonides Grills, MD  citalopram (CELEXA) 40 MG tablet Take 1 tablet (40 mg total) by mouth daily. For depression  04/03/15  Yes Leonides Grills, MD  hydrOXYzine (ATARAX/VISTARIL) 25 MG tablet Take 1 tablet (25 mg total) by mouth every 8 (eight) hours as needed for anxiety. 04/03/15  Yes Leonides Grills, MD  Multiple Vitamin (MULTIVITAMIN WITH MINERALS) TABS tablet Take 1 tablet by mouth daily. For low vitamin 02/17/15  Yes Encarnacion Slates, NP  traMADol (ULTRAM) 50 MG tablet Take 1 tablet (50 mg) every 12 hours: For pain 02/17/15  Yes Encarnacion Slates, NP  traZODone (DESYREL) 50 MG tablet Take 1 tablet (50 mg total) by mouth at bedtime. 04/03/15  Yes Leonides Grills, MD  valACYclovir (VALTREX) 1000 MG tablet Take 1 tablet (1,000 mg total) by mouth 2 (two) times daily. For viral infection 02/17/15  Yes Encarnacion Slates, NP     Allergies  Allergen Reactions  . Adhesive [Tape] Other (See Comments)    Causes red marks  . Wool Alcohol [Lanolin] Rash       Objective:  Physical Exam  Constitutional: She is oriented to person, place, and time. She appears well-developed and well-nourished. No distress.  BP 106/62 mmHg  Pulse 71  Temp(Src) 98.9 F (37.2 C) (Oral)  Resp 16  Wt 142 lb 3.2 oz (64.501 kg)  SpO2 98%   Eyes: Conjunctivae are normal. No scleral icterus.  Neck: No thyromegaly present.  Cardiovascular: Normal rate, regular rhythm, normal heart sounds and intact distal pulses.   Pulmonary/Chest: Effort normal and  breath sounds normal. Right breast exhibits no inverted nipple, no mass, no nipple discharge, no skin change and no tenderness. Left breast exhibits no inverted nipple, no mass, no nipple discharge, no skin change and no tenderness. Breasts are symmetrical.    Lymphadenopathy:    She has no cervical adenopathy.  Neurological: She is alert and oriented to person, place, and time.  Skin: Skin is warm and dry.  Psychiatric: She has a normal mood and affect. Her behavior is normal.           Assessment & Plan:   1. Breast pain, right Normal exam. Anticipatory guidance. If  continues to have pain, will order [place].  2. Insomnia 3. Situational stress 4. Major Depressive Disorder, recurrent episode, severe Continue follow-up with psychaitry and psychology. Continue reduced work hours, with anticipation that as she continues to adjust to her medications, she will be able to return to regular schedule. Reassess readiness in 1 week.   Fara Chute, PA-C Physician Assistant-Certified [facility]

## 2015-04-04 NOTE — Patient Instructions (Signed)
Keep up the good work

## 2015-04-12 ENCOUNTER — Telehealth: Payer: Self-pay

## 2015-04-12 NOTE — Telephone Encounter (Signed)
Butch Penny from [employer] would like to have someone call her after this patient is seen on 04/14/15 so that she will know what her work restrictions are and if she is being release to full duty. Her call back number is [phone] EXT [phone].

## 2015-04-12 NOTE — Telephone Encounter (Signed)
Please see the letter I wrote and my office notes from this patient's last visit with me.  She is scheduled to follow-up on Friday and her ability to return to full duty will again be assessed.

## 2015-04-13 ENCOUNTER — Telehealth: Payer: Self-pay | Admitting: Physician Assistant

## 2015-04-13 NOTE — Telephone Encounter (Signed)
Faxed OV notes and letter from 04/04/2015 to [facility]. This is related to patient's long standing short term disability claim.

## 2015-04-14 ENCOUNTER — Encounter: Payer: Self-pay | Admitting: Physician Assistant

## 2015-04-14 ENCOUNTER — Ambulatory Visit (INDEPENDENT_AMBULATORY_CARE_PROVIDER_SITE_OTHER): Payer: 59 | Admitting: Physician Assistant

## 2015-04-14 VITALS — BP 113/71 | HR 98 | Temp 98.0°F | Resp 18 | Ht 65.0 in | Wt 142.0 lb

## 2015-04-14 DIAGNOSIS — F332 Major depressive disorder, recurrent severe without psychotic features: Secondary | ICD-10-CM | POA: Diagnosis not present

## 2015-04-14 NOTE — Patient Instructions (Signed)
Ask for additional work when you finish yours. Ask to learn about what other people are doing. Take a book or journal or coloring book that you can read/write/color in at your desk when you finish your work and there is not additional work for you to do. Also consider crossword puzzles, word finds, etc. Make sure you ask your supervisor if those are acceptable.  Return to full time work beginning 04/24/2015.

## 2015-04-14 NOTE — Progress Notes (Signed)
Patient ID: [name] Xxx-Rood, female    DOB: December 15, 1980, 34 y.o.   MRN: [id number]  PCP: Doyal Saric, PA-C  Subjective:   Chief Complaint  Patient presents with  . Follow-up  . Anxiety  . Medication Management    HPI Presents for evaluation of major depressive disorder.  She continues to improve.  "I'm nonchalant" "I don't over-care" "No crazy thoughts" (no thoughts of harming herself or others). Not sleeping well. Still needs the trazodone. Back to exercising regularly. "I can't read. I can't sit for long periods of time. I can't concentrate. I'm antsy. I have to keep moving. I lose interest in things so easily. I love to read." "Severely focused" at work. "Productivity at work is awesome. But I get all my work done by 12." Majority of the time, there's not other work to do and she's bored. Concerned that going back to full time at work, if there's not enough to keep her busy, will be problematic.  Next psychiatry visit is late December.  Review of Systems  Constitutional: Negative.   HENT: Negative.   Eyes: Negative.   Respiratory: Negative.   Cardiovascular: Negative.   Gastrointestinal: Negative.   Endocrine: Negative.   Genitourinary: Negative.   Musculoskeletal: Negative.   Allergic/Immunologic: Negative.   Neurological: Negative.   Hematological: Negative.   Psychiatric/Behavioral: Positive for sleep disturbance and decreased concentration. Negative for suicidal ideas, hallucinations, behavioral problems, confusion, self-injury, dysphoric mood and agitation. The patient is nervous/anxious. The patient is not hyperactive.        Patient Active Problem List   Diagnosis Date Noted  . Umbilical hernia [id number]  . Severe episode of recurrent major depressive disorder, without psychotic features ([place])   . MDD (major depressive disorder), recurrent episode, severe ([place]) 02/12/2015  . Migraine 04/13/2012  . Fibroids 04/13/2012  . Anxiety 04/13/2012      Prior to Admission medications   Medication Sig Start Date End Date Taking? Authorizing Provider  ARIPiprazole (ABILIFY) 5 MG tablet Take 1 tablet (5 mg total) by mouth daily. For mood control 04/03/15  Yes Leonides Grills, MD  benztropine (COGENTIN) 0.5 MG tablet Take 2 tablets (1 mg total) by mouth 2 (two) times daily. 04/03/15 04/02/16 Yes Leonides Grills, MD  citalopram (CELEXA) 40 MG tablet Take 1 tablet (40 mg total) by mouth daily. For depression 04/03/15  Yes Leonides Grills, MD  hydrOXYzine (ATARAX/VISTARIL) 25 MG tablet Take 1 tablet (25 mg total) by mouth every 8 (eight) hours as needed for anxiety. 04/03/15  Yes Leonides Grills, MD  Multiple Vitamin (MULTIVITAMIN WITH MINERALS) TABS tablet Take 1 tablet by mouth daily. For low vitamin 02/17/15  Yes Encarnacion Slates, NP  traMADol (ULTRAM) 50 MG tablet Take 1 tablet (50 mg) every 12 hours: For pain 02/17/15  Yes Encarnacion Slates, NP  traZODone (DESYREL) 50 MG tablet Take 1 tablet (50 mg total) by mouth at bedtime. 04/03/15  Yes Leonides Grills, MD  valACYclovir (VALTREX) 1000 MG tablet Take 1 tablet (1,000 mg total) by mouth 2 (two) times daily. For viral infection 02/17/15  Yes Encarnacion Slates, NP     Allergies  Allergen Reactions  . Adhesive [Tape] Other (See Comments)    Causes red marks  . Wool Alcohol [Lanolin] Rash       Objective:  Physical Exam  Constitutional: She is oriented to person, place, and time. She appears well-developed and well-nourished. She is active and cooperative. No distress.  BP 113/71 mmHg  Pulse 98  Temp(Src) 98 F (36.7 C)  Resp 18  Ht 5\' 5"  (1.651 m)  Wt 142 lb (64.411 kg)  BMI 23.63 kg/m2   Eyes: Conjunctivae are normal.  Pulmonary/Chest: Effort normal.  Neurological: She is alert and oriented to person, place, and time.  Skin: Skin is warm and dry.  Psychiatric: Her speech is normal and behavior is normal. Judgment and thought content normal. Her mood appears  anxious. Her affect is not angry, not blunt, not labile and not inappropriate. Cognition and memory are normal. She does not exhibit a depressed mood.           Assessment & Plan:   1. Severe episode of recurrent major depressive disorder, without psychotic features ([place]) Improving. Continue current treatment and follow-up with psychiatry and psychology as planned. Continue reduced hours this upcoming week, the return to regular full-time duty on Monday 04/24/2015. Counseled on things to consider doing at her desk or in the office to occupy herself when she completes her work early and there are no other tasks for her. She will need to get approval for these activities, as she has been admonished for and now "banned" from using her phone or computer for non-work related activities in the past (she was told this was due to reduced productivity, but she says it was because the scanner wasn't working. Now that she has a new scanner, productivity isn't an issue, but she remains restricted regarding phone and computer use).  Return in about 3 weeks (around 05/05/2015) for re-evaluation.   Fara Chute, PA-C Physician Assistant-Certified [facility]

## 2015-04-17 ENCOUNTER — Encounter: Payer: Self-pay | Admitting: Physician Assistant

## 2015-04-18 ENCOUNTER — Ambulatory Visit (HOSPITAL_COMMUNITY): Payer: Self-pay | Admitting: Psychiatry

## 2015-04-20 ENCOUNTER — Encounter: Payer: Self-pay | Admitting: Gynecology

## 2015-04-20 ENCOUNTER — Ambulatory Visit (INDEPENDENT_AMBULATORY_CARE_PROVIDER_SITE_OTHER): Payer: 59 | Admitting: Gynecology

## 2015-04-20 ENCOUNTER — Ambulatory Visit (INDEPENDENT_AMBULATORY_CARE_PROVIDER_SITE_OTHER): Payer: 59

## 2015-04-20 ENCOUNTER — Other Ambulatory Visit: Payer: Self-pay | Admitting: Gynecology

## 2015-04-20 VITALS — BP 120/70

## 2015-04-20 DIAGNOSIS — D251 Intramural leiomyoma of uterus: Secondary | ICD-10-CM | POA: Diagnosis not present

## 2015-04-20 DIAGNOSIS — N83202 Unspecified ovarian cyst, left side: Secondary | ICD-10-CM

## 2015-04-20 NOTE — Progress Notes (Signed)
Nicole Wallace [birth year] [id number]         34 y.o.  G0P0000 presents for follow up ultrasound. Ultrasound 12/2014 showed multiple myomas with a 50 mm thin-walled echo-free avascular left ovarian cyst. Was asked to follow up for repeat ultrasound to make sure the left ovarian cyst resolved. She is a long history of multiple myomas largest measuring approximately 50 mm. Also having some discomfort with intercourse that she is attributed to her leiomyoma. Currently has an IUD in place.  Past medical history,surgical history, problem list, medications, allergies, family history and social history were all reviewed and documented in the EPIC chart.  Directed ROS with pertinent positives and negatives documented in the history of present illness/assessment and plan.  Exam: Filed Vitals:   04/20/15 1426  BP: 120/70   General appearance:  Normal  Ultrasound shows uterus generous in size with multiple myomas the largest measuring 50 mm, 39 mm, 26 mm, 23 mm. Endometrial echo 3.8 mm. IUD within normal position. Prior left ovarian cyst not visualized with smaller classic corpus luteal cyst on the left. Right ovary with small thin-walled 54mm cyst. Mild fluid in the cul-de-sac at 41 x 10 mm.  Assessment/Plan:  34 y.o. G0P0000  With multiple myomas, IUD in proper location and resolution of her prior left ovarian cyst.patient was asking me about what to do with her myomas. I reviewed with her various options to include continued observation is a. Be overall stable over years of ultrasound observation. She is having some discomfort no tripping to this and would really like to have some of them are removed. I reviewed with her possibilities to include robotic versus open myomectomy. Given multiple small myomas the issues as to whether some will be left with a substantial risk of growing in the future and having to deal with them at that point. Also the issues if she would desire pregnancy in the future the  risks of a multiple myomectomy as far as possibly being associated with infertility/possible hysterectomy at the time of myomectomy due to complications as well as the risks of uterine integrity with an advancing pregnancy. She asked about uterine artery embolization is apparently another physician also talked to her about this. I reviewed with her that I'm not sure what criteria they use and that she could talk to them about this. Again the pregnancy issue with this was discussed in that pregnancies have occurred after uterine artery embolization but there are very limited numbers and the safety of this is not proven.  Also whether uterine artery embolization would interfere with trying to get pregnant.  Lastly I talked about hysterectomy is the definitive surgery. She is not ready to move towards that but would like to talk to someone about uterine artery embolization will help her make this arrangements.    Anastasio Auerbach MD, 3:47 PM 04/20/2015

## 2015-04-20 NOTE — Patient Instructions (Signed)
Office will contact you to talk to the other doctors about uterine artery embolization.

## 2015-04-21 ENCOUNTER — Telehealth: Payer: Self-pay | Admitting: *Deleted

## 2015-04-21 NOTE — Telephone Encounter (Signed)
-----   Message from Anastasio Auerbach, MD sent at 04/20/2015  3:53 PM EST ----- Patient interested in talking to the interventional radiologists about uterine artery embolization.

## 2015-04-21 NOTE — Telephone Encounter (Signed)
I called and left message at [facility] at [phone] to see how to proceed with the below.

## 2015-04-24 ENCOUNTER — Other Ambulatory Visit: Payer: Self-pay | Admitting: Gynecology

## 2015-04-24 DIAGNOSIS — D259 Leiomyoma of uterus, unspecified: Secondary | ICD-10-CM

## 2015-04-24 NOTE — Telephone Encounter (Signed)
Vikki called back and said she will call pt and schedule.

## 2015-04-26 NOTE — Telephone Encounter (Signed)
Patient notified via My Chart.  Done.

## 2015-04-26 NOTE — Telephone Encounter (Signed)
Nicole Wallace, patient called regarding letter she says supposed to be faxed to [facility] by today. She called Jasmine today but she was not at her desk today (she was at 104). Has letter been completed? She says you already have information regarding who it needs to go to and fax number from previous message/conversation.

## 2015-04-27 ENCOUNTER — Telehealth: Payer: Self-pay | Admitting: Physician Assistant

## 2015-04-27 NOTE — Telephone Encounter (Signed)
Spoke with patient to let her know that [place] completed her disability claim form and that it was faxed on 04/26/2015 at 2:30pm to [place] with a successful confirmation.

## 2015-05-02 ENCOUNTER — Ambulatory Visit (INDEPENDENT_AMBULATORY_CARE_PROVIDER_SITE_OTHER): Payer: 59 | Admitting: Clinical

## 2015-05-02 ENCOUNTER — Encounter (HOSPITAL_COMMUNITY): Payer: Self-pay | Admitting: Clinical

## 2015-05-02 DIAGNOSIS — F331 Major depressive disorder, recurrent, moderate: Secondary | ICD-10-CM | POA: Diagnosis not present

## 2015-05-02 DIAGNOSIS — F411 Generalized anxiety disorder: Secondary | ICD-10-CM
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
## 2015-05-02 NOTE — Telephone Encounter (Signed)
Appointment 05/23/15

## 2015-05-09 ENCOUNTER — Encounter (HOSPITAL_COMMUNITY): Payer: Self-pay | Admitting: Psychiatry

## 2015-05-09 ENCOUNTER — Ambulatory Visit (INDEPENDENT_AMBULATORY_CARE_PROVIDER_SITE_OTHER): Payer: 59 | Admitting: Psychiatry

## 2015-05-09 ENCOUNTER — Encounter: Payer: Self-pay | Admitting: Physician Assistant

## 2015-05-09 ENCOUNTER — Ambulatory Visit (INDEPENDENT_AMBULATORY_CARE_PROVIDER_SITE_OTHER): Payer: 59 | Admitting: Physician Assistant

## 2015-05-09 VITALS — BP 122/76 | HR 60 | Temp 98.1°F | Resp 16 | Ht 65.0 in

## 2015-05-09 VITALS — BP 118/80 | HR 72 | Wt 153.0 lb

## 2015-05-09 DIAGNOSIS — F419 Anxiety disorder, unspecified: Secondary | ICD-10-CM

## 2015-05-09 DIAGNOSIS — F332 Major depressive disorder, recurrent severe without psychotic features: Secondary | ICD-10-CM | POA: Diagnosis not present

## 2015-05-09 DIAGNOSIS — F1010 Alcohol abuse, uncomplicated: Secondary | ICD-10-CM | POA: Diagnosis not present

## 2015-05-09 DIAGNOSIS — R635 Abnormal weight gain: Secondary | ICD-10-CM | POA: Diagnosis not present

## 2015-05-09 LAB — COMPREHENSIVE METABOLIC PANEL
ALT: 19 U/L (ref 6–29)
AST: 24 U/L (ref 10–30)
Albumin: 4.3 g/dL (ref 3.6–5.1)
Alkaline Phosphatase: 50 U/L (ref 33–115)
BILIRUBIN TOTAL: 0.5 mg/dL (ref 0.2–1.2)
BUN: 7 mg/dL (ref 7–25)
CO2: 26 mmol/L (ref 20–31)
CREATININE: 0.69 mg/dL (ref 0.50–1.10)
Calcium: 9.4 mg/dL (ref 8.6–10.2)
Chloride: 102 mmol/L (ref 98–110)
GLUCOSE: 76 mg/dL (ref 65–99)
POTASSIUM: 4.1 mmol/L (ref 3.5–5.3)
SODIUM: 136 mmol/L (ref 135–146)
Total Protein: 7 g/dL (ref 6.1–8.1)

## 2015-05-09 LAB — CBC WITH DIFFERENTIAL/PLATELET
BASOS ABS: 0 10*3/uL (ref 0.0–0.1)
Basophils Relative: 0 % (ref 0–1)
EOS ABS: 0.2 10*3/uL (ref 0.0–0.7)
EOS PCT: 4 % (ref 0–5)
HCT: 39.6 % (ref 36.0–46.0)
Hemoglobin: 13.3 g/dL (ref 12.0–15.0)
LYMPHS PCT: 36 % (ref 12–46)
Lymphs Abs: 1.9 10*3/uL (ref 0.7–4.0)
MCH: 33.5 pg (ref 26.0–34.0)
MCHC: 33.6 g/dL (ref 30.0–36.0)
MCV: 99.7 fL (ref 78.0–100.0)
MPV: 10.3 fL (ref 8.6–12.4)
Monocytes Absolute: 0.3 10*3/uL (ref 0.1–1.0)
Monocytes Relative: 6 % (ref 3–12)
NEUTROS PCT: 54 % (ref 43–77)
Neutro Abs: 2.8 10*3/uL (ref 1.7–7.7)
PLATELETS: 280 10*3/uL (ref 150–400)
RBC: 3.97 MIL/uL (ref 3.87–5.11)
RDW: 12.3 % (ref 11.5–15.5)
WBC: 5.2 10*3/uL (ref 4.0–10.5)

## 2015-05-09 LAB — T4, FREE: Free T4: 0.94 ng/dL (ref 0.80–1.80)

## 2015-05-09 LAB — TSH: TSH: 1.29 u[IU]/mL (ref 0.350–4.500)

## 2015-05-09 LAB — T3, FREE: T3, Free: 2.5 pg/mL (ref 2.3–4.2)

## 2015-05-09 NOTE — Patient Instructions (Signed)
I will contact you with your lab results as soon as they are available.   If you have not heard from me in 2 weeks, please contact me.  The fastest way to get your results is to register for My Chart (see the instructions on the last page of this printout).   
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
## 2015-05-09 NOTE — Progress Notes (Signed)
Patient ID: [name] Xxx-Hitz, female    DOB: 04/05/1981, 34 y.o.   MRN: [id number]  PCP: Tykwon Fera, PA-C  Subjective:   Chief Complaint  Patient presents with  . Follow-up  . Anxiety    HPI Presents for evaluation of MDD.  Her primary concern is rapid weight gain since her last visit. Gained 7 lbs since 05/05/2015. Reports that she was 144 lbs then, 150.8 lbs today (both on her home scale). A former colleague commented that it looked like she was retaining fluid in her face and recommended she mention it to me. Her psychiatrist also advised that she discuss it with me. She purchased a "fluid pill" at [employer] today and has taken 1 dose. "My legs feel heavy and tight." Her jeans feel uncomfortable, like they are squeezing her. Hasn't been drinking as much fluid. Has been increasing the salt in her diet. Is no longer exercising regularly. Notes 2 BMs this morning, both soft.  Saw her eye specialist and psychiatrist both already today.  Her disability claim was denied and she didn't get her last check. She was told that they needed more specific details. She is back at work full time now, and it's going "ok."   Psychiatry note from today's visit reviewed. She continues to think about suicide, but has no plan and no intent. She feels that it's not normal to be thinking this way, and repeatedly asks why it's happening, what she should do about it.  She was advised to resume the trazodone this morning, as she had stopped it, not wanting to take medication "for the rest of my life." No other changes in her medications were made. She will see the therapist, Tharon Aquas, next week and Dr. Adele Schilder in 1 month.  Review of Systems  Constitutional: Positive for unexpected weight change. Negative for fever, chills, diaphoresis, activity change, appetite change and fatigue.  HENT: Negative for congestion, sinus pressure, sneezing and sore throat.   Eyes: Negative for visual disturbance.    Respiratory: Negative for cough, chest tightness and shortness of breath.   Cardiovascular: Positive for leg swelling. Negative for chest pain and palpitations.  Gastrointestinal: Negative for nausea, vomiting, abdominal pain, diarrhea, constipation and abdominal distention.  Endocrine: Negative.   Genitourinary: Negative for dysuria, urgency, frequency and hematuria.  Musculoskeletal: Negative for myalgias and arthralgias.  Skin: Negative for rash.  Allergic/Immunologic: Negative for immunocompromised state.  Neurological: Negative for dizziness, speech difficulty, light-headedness, numbness and headaches.  Hematological: Negative for adenopathy. Bruises/bleeds easily.  Psychiatric/Behavioral: Positive for suicidal ideas, sleep disturbance, dysphoric mood and decreased concentration. Negative for hallucinations, behavioral problems, confusion, self-injury and agitation. The patient is nervous/anxious. The patient is not hyperactive.        Patient Active Problem List   Diagnosis Date Noted  . Umbilical hernia [id number]  . Severe episode of recurrent major depressive disorder, without psychotic features ([place])   . MDD (major depressive disorder), recurrent episode, severe ([place]) 02/12/2015  . Migraine 04/13/2012  . Fibroids 04/13/2012  . Anxiety 04/13/2012     Prior to Admission medications   Medication Sig Start Date End Date Taking? Authorizing Provider  ARIPiprazole (ABILIFY) 5 MG tablet Take 1 tablet (5 mg total) by mouth daily. For mood control 04/03/15  Yes Leonides Grills, MD  benztropine (COGENTIN) 0.5 MG tablet Take 2 tablets (1 mg total) by mouth 2 (two) times daily. 04/03/15 04/02/16 Yes Leonides Grills, MD  citalopram (CELEXA) 40 MG tablet Take 1 tablet (40 mg total) by  mouth daily. For depression 04/03/15  Yes Leonides Grills, MD  hydrOXYzine (ATARAX/VISTARIL) 25 MG tablet Take 1 tablet (25 mg total) by mouth every 8 (eight) hours as needed for anxiety.  04/03/15  Yes Leonides Grills, MD  Multiple Vitamin (MULTIVITAMIN WITH MINERALS) TABS tablet Take 1 tablet by mouth daily. For low vitamin 02/17/15  Yes Encarnacion Slates, NP  traMADol (ULTRAM) 50 MG tablet Take 1 tablet (50 mg) every 12 hours: For pain 02/17/15  Yes Encarnacion Slates, NP  traZODone (DESYREL) 50 MG tablet Take 1 tablet (50 mg total) by mouth at bedtime. 04/03/15  Yes Leonides Grills, MD  valACYclovir (VALTREX) 1000 MG tablet Take 1 tablet (1,000 mg total) by mouth 2 (two) times daily. For viral infection 02/17/15  Yes Encarnacion Slates, NP     Allergies  Allergen Reactions  . Adhesive [Tape] Other (See Comments)    Causes red marks  . Wool Alcohol [Lanolin] Rash       Objective:  Physical Exam  Constitutional: She is oriented to person, place, and time. She appears well-developed and well-nourished. She is active and cooperative. No distress.  BP 122/76 mmHg  Pulse 60  Temp(Src) 98.1 F (36.7 C)  Resp 16  Ht 5\' 5"  (1.651 m)  Wt   LMP 04/13/2015 Weight is up 9 lbs since 04/14/2015.  HENT:  Head: Normocephalic and atraumatic.  Eyes: Conjunctivae and EOM are normal. Pupils are equal, round, and reactive to light. No scleral icterus.  Neck: Full passive range of motion without pain and phonation normal. Neck supple. No thyromegaly present.  Cardiovascular: Normal rate, regular rhythm, normal heart sounds and intact distal pulses.   Pulmonary/Chest: Effort normal and breath sounds normal.  Musculoskeletal:       Right lower leg: She exhibits no swelling and no edema.       Left lower leg: She exhibits no swelling and no edema.  Lymphadenopathy:       Head (right side): No submandibular and no tonsillar adenopathy present.       Head (left side): No submandibular and no tonsillar adenopathy present.    She has no cervical adenopathy.       Right: No supraclavicular adenopathy present.       Left: No supraclavicular adenopathy present.  Neurological: She is alert and  oriented to person, place, and time. She has normal strength. No cranial nerve deficit. Gait normal.  Skin: Skin is warm and dry. No rash noted.  Psychiatric: Her speech is normal and behavior is normal. Judgment normal. Her mood appears anxious. Her affect is blunt. Her affect is not angry, not labile and not inappropriate. Thought content is not paranoid and not delusional. Cognition and memory are normal. She exhibits a depressed mood. She expresses suicidal (but no intention) ideation. She expresses no homicidal ideation. She expresses no suicidal plans and no homicidal plans.           Assessment & Plan:   1. Abnormal weight gain Suspect this is a combination of reduced exercise, Abilify, and sleep deprivation.  Await lab results. Encouraged hydration, lower sodium diet. Advised against the OTC fluid pill for now.  - CBC with Differential/Platelet - Comprehensive metabolic panel - TSH - T3, Free - T4, Free   Return in about 1 month (around 06/09/2015).   Fara Chute, PA-C Physician Assistant-Certified [facility]

## 2015-05-18 ENCOUNTER — Ambulatory Visit (INDEPENDENT_AMBULATORY_CARE_PROVIDER_SITE_OTHER): Payer: 59 | Admitting: Clinical

## 2015-05-18 ENCOUNTER — Encounter (HOSPITAL_COMMUNITY): Payer: Self-pay | Admitting: Clinical

## 2015-05-18 DIAGNOSIS — F411 Generalized anxiety disorder: Secondary | ICD-10-CM

## 2015-05-18 DIAGNOSIS — F331 Major depressive disorder, recurrent, moderate: Secondary | ICD-10-CM

## 2015-05-18 NOTE — Progress Notes (Signed)
   THERAPIST PROGRESS NOTE  Session Time: 1:33 - 2:28  Participation Level: Active  Behavioral Response: CasualAlertAnxious  Type of Therapy: Individual Therapy  Treatment Goals addressed: improve psychiatric symptoms, decrease anxiety,   Interventions: motivational interviewing, grounding and mindfulness techniques  Summary: Nicole Wallace is a 35 y.o. female who presents with Moderate episode of recurrent major depressive disorder, Generalized Anxiety Disorder.   Suicidal/Homicidal: Nowithout intent/plan  Therapist Response: Walda met with clinician for an individual session. She discussed her psychiatric symptoms, her current life events, and her goals for therapy. Johannah shared that her medication has helped eleviate some of her symptoms. She shared that she still has been experiencing a lot of anxiety. She shared she is trying not to worry about her dog. She shared that she has catastrophic thoughts about her dogs health and well being. She shared that she is working to not focus on it. Client and clinician discussed how she feels when she thinks those thoughts. She shared that she felt sad and anxious even though her dog is happy and well. She shared how this same thought process affects other aspects of her life. Client and clinician discussed some basic cbt concepts. Clinician introduced a homework packet which Keryl agreed to complete and bring back with her at next session. Clinician introduced grounding and mindfulness techniques. Clinician explained the process, purpose and practice of the techniques. Client and clinician practiced some of the techniques together. Jayln agreed to practice the techniques daily until next session  Plan: Return again in 1-2 weeks.  Diagnosis: Axis I: Moderate episode of recurrent major depressive disorder, Generalized Anxiety Disorder    Shantanique Hodo A, LCSW 05/18/2015

## 2015-05-23 ENCOUNTER — Other Ambulatory Visit: Payer: Self-pay

## 2015-05-29 ENCOUNTER — Telehealth: Payer: Self-pay | Admitting: Physician Assistant

## 2015-05-29 NOTE — Telephone Encounter (Signed)
Hi [name],  [employer] has faxed 3 month renewal FMLA forms (last forms completed in October 2016). I have already completed 90% of the paperwork however I do have a question about the patient's work schedule. In the office visit notes from 04/14/2015 the patient was to return to regular, full time hours on 04/24/2015. Because of that will the patient need intermittent leave now versus the reduced work schedule? We can go over this together one day this week when you have time. I will place the forms in your box for now.   Thanks, [employer]

## 2015-05-30 NOTE — Telephone Encounter (Signed)
[employer] faxed over some FMLA forms to be completed, I saw this message and wasn't sure what was going on with this patient so I will leave them blank for now and in the FMLA-Pending box upstairs.

## 2015-05-30 NOTE — Telephone Encounter (Signed)
Happy to go over this together this week.  Yes, you are correct, this will change to intermittent leave to attend follow-up visits, which are medically necessary.

## 2015-05-31 ENCOUNTER — Ambulatory Visit (HOSPITAL_COMMUNITY): Payer: Self-pay | Admitting: Clinical

## 2015-06-07 ENCOUNTER — Ambulatory Visit (HOSPITAL_COMMUNITY): Payer: Self-pay | Admitting: Clinical

## 2015-06-09 ENCOUNTER — Ambulatory Visit (INDEPENDENT_AMBULATORY_CARE_PROVIDER_SITE_OTHER): Payer: 59 | Admitting: Psychiatry

## 2015-06-09 ENCOUNTER — Encounter (HOSPITAL_COMMUNITY): Payer: Self-pay | Admitting: Psychiatry

## 2015-06-09 VITALS — BP 114/72 | HR 83 | Ht 65.0 in | Wt 155.0 lb

## 2015-06-09 DIAGNOSIS — F331 Major depressive disorder, recurrent, moderate: Secondary | ICD-10-CM

## 2015-06-09 MED ORDER — LAMOTRIGINE 25 MG PO TABS: ORAL_TABLET | ORAL | Status: DC

## 2015-06-09 MED ORDER — CITALOPRAM HYDROBROMIDE 40 MG PO TABS: 40.0000 mg | ORAL_TABLET | Freq: Every day | ORAL | Status: DC

## 2015-06-09 MED ORDER — TRAZODONE HCL 50 MG PO TABS: 50.0000 mg | ORAL_TABLET | Freq: Every day | ORAL | Status: DC
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
## 2015-06-13 ENCOUNTER — Ambulatory Visit: Payer: 59 | Admitting: Physician Assistant

## 2015-06-14 ENCOUNTER — Ambulatory Visit (INDEPENDENT_AMBULATORY_CARE_PROVIDER_SITE_OTHER): Payer: 59 | Admitting: Physician Assistant

## 2015-06-14 VITALS — BP 120/80 | HR 61 | Temp 98.3°F | Resp 17 | Ht 64.0 in | Wt 154.0 lb

## 2015-06-14 DIAGNOSIS — F332 Major depressive disorder, recurrent severe without psychotic features: Secondary | ICD-10-CM | POA: Diagnosis not present

## 2015-06-14 DIAGNOSIS — G4700 Insomnia, unspecified: Secondary | ICD-10-CM

## 2015-06-14 DIAGNOSIS — Z658 Other specified problems related to psychosocial circumstances: Secondary | ICD-10-CM | POA: Diagnosis not present

## 2015-06-14 DIAGNOSIS — F439 Reaction to severe stress, unspecified: Secondary | ICD-10-CM

## 2015-06-14 NOTE — Progress Notes (Signed)
Patient ID: Nicole Wallace, female    DOB: [birth year], 35 y.o.   MRN: [id number]  PCP: Timmia Cogburn, PA-C  Subjective:   Chief Complaint  Patient presents with  . Follow-up    HPI Presents for evaluation of major depressive disorder.  She's been back at work, full duty for the past several weeks, and it's going well.  Saw psychiatry on Friday, 06/09/15, Dr. Adele Schilder. Prescribed Lamictal, which she is afraid to start. She is to  Reduce the Ability x 1 week, then stop it. She is exercising regularly and eating more.  Her mother has just been diagnosed with Diabetes.  Cancelled her last two therapy appointments. Was assigned homework, and didn't do it. Is also taking a class (elementary statistics) toward her associates degree at [employer].  Overall, feels good. Is planning a vacation with her boyfriend and urging her mother to take good care of herself. She is pursuing fitness modeling.   Doesn't want to be taking medications. Doesn't like the Trazodone. It doesn't always help, and sometimes she over-sleeps. She can't take it earlier in the evening because she falls asleep right away and doesn't let her dog out.    Review of Systems     Patient Active Problem List   Diagnosis Date Noted  . Umbilical hernia [id number]  . Severe episode of recurrent major depressive disorder, without psychotic features ([place])   . MDD (major depressive disorder), recurrent episode, severe ([place]) 02/12/2015  . Migraine 04/13/2012  . Fibroids 04/13/2012  . Anxiety 04/13/2012     Prior to Admission medications   Medication Sig Start Date End Date Taking? Authorizing Provider  ARIPiprazole (ABILIFY) 5 MG tablet Take 1 tablet (5 mg total) by mouth daily. For mood control 04/03/15  Yes Leonides Grills, MD  benztropine (COGENTIN) 0.5 MG tablet Take 2 tablets (1 mg total) by mouth 2 (two) times daily. 04/03/15 04/02/16 Yes Leonides Grills, MD  citalopram (CELEXA) 40 MG tablet Take 1  tablet (40 mg total) by mouth daily. For depression 06/09/15  Yes [name], MD  hydrOXYzine (ATARAX/VISTARIL) 25 MG tablet Take 1 tablet (25 mg total) by mouth every 8 (eight) hours as needed for anxiety. 04/03/15  Yes Leonides Grills, MD  lamoTRIgine (LAMICTAL) 25 MG tablet Take 1 tab daily for 1 week and than 2 tab daily 06/09/15  Yes [name], MD  Multiple Vitamin (MULTIVITAMIN WITH MINERALS) TABS tablet Take 1 tablet by mouth daily. For low vitamin 02/17/15  Yes Encarnacion Slates, NP  traMADol (ULTRAM) 50 MG tablet Take 1 tablet (50 mg) every 12 hours: For pain 02/17/15  Yes Encarnacion Slates, NP  traZODone (DESYREL) 50 MG tablet Take 1 tablet (50 mg total) by mouth at bedtime. 06/09/15  Yes [name], MD  valACYclovir (VALTREX) 1000 MG tablet Take 1 tablet (1,000 mg total) by mouth 2 (two) times daily. For viral infection 02/17/15  Yes Encarnacion Slates, NP     Allergies  Allergen Reactions  . Adhesive [Tape] Other (See Comments)    Causes red marks  . Wool Alcohol [Lanolin] Rash       Objective:  Physical Exam  Constitutional: She is oriented to person, place, and time. She appears well-developed and well-nourished. She is active and cooperative. No distress.  BP 120/80 mmHg  Pulse 61  Temp(Src) 98.3 F (36.8 C) (Oral)  Resp 17  Ht 5\' 4"  (1.626 m)  Wt 154 lb (69.854 kg)  BMI 26.42 kg/m2  SpO2 98%  HENT:  Head: Normocephalic and atraumatic.  Right Ear: Hearing normal.  Left Ear: Hearing normal.  Eyes: Conjunctivae are normal. No scleral icterus.  Neck: Normal range of motion. Neck supple. No thyromegaly present.  Cardiovascular: Normal rate, regular rhythm and normal heart sounds.   Pulses:      Radial pulses are 2+ on the right side, and 2+ on the left side.  Pulmonary/Chest: Effort normal and breath sounds normal.  Lymphadenopathy:       Head (right side): No tonsillar, no preauricular, no posterior auricular and no occipital adenopathy present.       Head (left  side): No tonsillar, no preauricular, no posterior auricular and no occipital adenopathy present.    She has no cervical adenopathy.       Right: No supraclavicular adenopathy present.       Left: No supraclavicular adenopathy present.  Neurological: She is alert and oriented to person, place, and time. No sensory deficit.  Skin: Skin is warm, dry and intact. No rash noted. No cyanosis or erythema. Nails show no clubbing.  Psychiatric: She has a normal mood and affect. Her speech is normal and behavior is normal.           Assessment & Plan:   1. Severe episode of recurrent major depressive disorder, without psychotic features ([place]) 2. Insomnia 3. Situational stress Encouraged her to take the medications as prescribed by Dr. Adele Schilder, and to reschedule with the therapist. Also encouraged her to advocate for herself if she doesn't like a particular medication (Trazodone) and ask for alternatives.  Continue regular work. Follow up with me in 8 weeks, sooner if needed.   Fara Chute, PA-C Physician Assistant-Certified [facility]

## 2015-06-14 NOTE — Patient Instructions (Signed)
Reschedule with the therapist. I think it's important.  Start the Lamictal.  Next time you see Dr. Adele Schilder, specifically ask for an alternative to the trazodone.

## 2015-07-14 ENCOUNTER — Ambulatory Visit (HOSPITAL_COMMUNITY): Payer: Self-pay | Admitting: Psychiatry

## 2015-07-21 ENCOUNTER — Encounter (HOSPITAL_COMMUNITY): Payer: Self-pay | Admitting: Psychiatry

## 2015-07-21 ENCOUNTER — Ambulatory Visit (INDEPENDENT_AMBULATORY_CARE_PROVIDER_SITE_OTHER): Payer: 59 | Admitting: Psychiatry

## 2015-07-21 VITALS — BP 118/68 | HR 60 | Ht 65.0 in | Wt 157.0 lb

## 2015-07-21 DIAGNOSIS — F331 Major depressive disorder, recurrent, moderate: Secondary | ICD-10-CM

## 2015-07-21 MED ORDER — LAMOTRIGINE 100 MG PO TABS: 100.0000 mg | ORAL_TABLET | Freq: Every day | ORAL | Status: DC

## 2015-07-21 MED ORDER — TRAZODONE HCL 50 MG PO TABS: 50.0000 mg | ORAL_TABLET | Freq: Every day | ORAL | Status: DC

## 2015-07-21 MED ORDER — CITALOPRAM HYDROBROMIDE 40 MG PO TABS: 40.0000 mg | ORAL_TABLET | Freq: Every day | ORAL | Status: DC
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
## 2015-07-25 ENCOUNTER — Telehealth (HOSPITAL_COMMUNITY): Payer: Self-pay

## 2015-07-25 ENCOUNTER — Other Ambulatory Visit (HOSPITAL_COMMUNITY): Payer: Self-pay | Admitting: Psychiatry

## 2015-07-25 NOTE — Telephone Encounter (Signed)
Patient reports she is taking the Lamictal only in the morning and the Trazodone at night.  Reports took 2 of the 50 mg the previous night at 9 pm and states she "did not sleep at all".  Agreed to inform Dr. Adele Schilder and would call back.

## 2015-07-25 NOTE — Telephone Encounter (Signed)
Telephone call with patient for a second time this date to inform Dr. Adele Schilder wanted patient to cut her Lamictal in 1/2, to take now 50 mg total each morning and to continue Trazodone 50 mg at bedtime to sleep.  Patient agreed to cut medication down to 50 mg a day beginning on 07/26/15 and if not sleeping better by 07/28/15 to call back.

## 2015-07-25 NOTE — Telephone Encounter (Signed)
Patient called and states that you increased the Lamictal at her appointment on Friday and she has not slept since. She states that the trazadone is not helping at all, she took 2 last night and still could not sleep. Please review and advise, thank you

## 2015-07-27 ENCOUNTER — Ambulatory Visit (HOSPITAL_COMMUNITY): Payer: Self-pay | Admitting: Clinical

## 2015-08-03 ENCOUNTER — Encounter (HOSPITAL_COMMUNITY): Payer: Self-pay | Admitting: Clinical

## 2015-08-03 ENCOUNTER — Ambulatory Visit (INDEPENDENT_AMBULATORY_CARE_PROVIDER_SITE_OTHER): Payer: 59 | Admitting: Clinical

## 2015-08-03 DIAGNOSIS — F331 Major depressive disorder, recurrent, moderate: Secondary | ICD-10-CM

## 2015-08-03 DIAGNOSIS — F411 Generalized anxiety disorder: Secondary | ICD-10-CM | POA: Diagnosis not present

## 2015-08-03 NOTE — Progress Notes (Signed)
   THERAPIST PROGRESS NOTE  Session Time: 11:02 - 11:58  Participation Level: Active  Behavioral Response: CasualAlertDepressed  Type of Therapy: Individual Therapy  Treatment Goals addressed: improve psychiatric symptoms, elevate mood (improved self-esteem and increased hopefulness), improve unhelpful thought patterns, reduce irrational fears and worries (interpret ordinary events and situations)  Interventions: CBT and Motivational Interviewing, psychoeduation, grounding and mindfulness techniques  Summary: Nicole [facility] is a 35 y.o. female who presents with Moderate episode of recurrent major depressive disorder, Generalized Anxiety Disorder. .   Suicidal/Homicidal: Nowithout intent/plan  Therapist ResponseAngus Palms met with clinician for an individual session. She discussed her psychiatric symptoms, her current life events, and her homework. [name] shared that she was feeling okay. She shared that she has not been as tearful, sad, or anxious, however she has also been unable to experience positive emotions. She shared she is kind of just numb to everything. Clinician suggest she shared about her emotional numbness with her psychiatrist. She shared that she is going on vacation with her boyfriend and is hopeful that getting some relaxation might increase her positive emotions. [name] shared that she has become more aware of those in her life who speak and behave in ways that increase her depression. Client and clinician discussed her interactions with others. [name] shared the negative automatic thoughts she experiences and her efforts to let them go. Client and clinician discussed the process of challenging negative automatic thoughts. Clinician suggest she start a homework packet [name] shared that she would like to wait until she return from vacation to begin the process. She shared that she had been using her grounding and mindfulness techniques. [name] shared that they help her  to pause her negative automatic thoughts. She agreed to continue to practice them until next session  Plan: Return again in 1- 2 weeks.  Diagnosis: Axis I: Moderate episode of recurrent major depressive disorder, Generalized Anxiety Disorder.    [name] A, LCSW 08/03/2015

## 2015-08-09 ENCOUNTER — Ambulatory Visit (HOSPITAL_COMMUNITY): Payer: Self-pay | Admitting: Psychiatry

## 2015-08-10 ENCOUNTER — Ambulatory Visit (HOSPITAL_COMMUNITY): Payer: Self-pay | Admitting: Clinical

## 2015-08-15 ENCOUNTER — Telehealth (HOSPITAL_COMMUNITY): Payer: Self-pay

## 2015-08-21 ENCOUNTER — Encounter (HOSPITAL_COMMUNITY): Payer: Self-pay | Admitting: Psychiatry

## 2015-08-21 ENCOUNTER — Ambulatory Visit (INDEPENDENT_AMBULATORY_CARE_PROVIDER_SITE_OTHER): Payer: 59 | Admitting: Psychiatry

## 2015-08-21 VITALS — BP 116/74 | HR 55 | Ht 65.0 in | Wt 157.2 lb

## 2015-08-21 DIAGNOSIS — F419 Anxiety disorder, unspecified: Secondary | ICD-10-CM

## 2015-08-21 DIAGNOSIS — F1010 Alcohol abuse, uncomplicated: Secondary | ICD-10-CM

## 2015-08-21 DIAGNOSIS — F331 Major depressive disorder, recurrent, moderate: Secondary | ICD-10-CM

## 2015-08-21 MED ORDER — TRAZODONE HCL 50 MG PO TABS: 50.0000 mg | ORAL_TABLET | Freq: Every day | ORAL | Status: DC

## 2015-08-21 MED ORDER — HYDROXYZINE HCL 25 MG PO TABS: 25.0000 mg | ORAL_TABLET | Freq: Three times a day (TID) | ORAL | Status: DC | PRN

## 2015-08-21 MED ORDER — LAMOTRIGINE 150 MG PO TABS: 150.0000 mg | ORAL_TABLET | Freq: Every day | ORAL | Status: DC

## 2015-08-21 MED ORDER — CITALOPRAM HYDROBROMIDE 40 MG PO TABS: 40.0000 mg | ORAL_TABLET | Freq: Every day | ORAL | Status: DC

## 2015-08-21 MED FILL — lamoTRIgine 150 MG TABS: 150 | 30 days supply | Qty: 30 | Fill #0
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
## 2015-08-23 ENCOUNTER — Other Ambulatory Visit (HOSPITAL_COMMUNITY): Payer: Self-pay | Admitting: Psychiatry

## 2015-08-24 ENCOUNTER — Other Ambulatory Visit (HOSPITAL_COMMUNITY): Payer: Self-pay | Admitting: Psychiatry

## 2015-08-24 NOTE — Telephone Encounter (Signed)
New script send on 4/12/17with differrent dose.

## 2015-08-29 ENCOUNTER — Telehealth (HOSPITAL_COMMUNITY): Payer: Self-pay

## 2015-08-29 NOTE — Telephone Encounter (Signed)
Patient calling, she would like to know if she can increase the Lamictal up to 200 mg - Please review and advise, thank you

## 2015-08-30 ENCOUNTER — Ambulatory Visit (INDEPENDENT_AMBULATORY_CARE_PROVIDER_SITE_OTHER): Payer: 59 | Admitting: Clinical

## 2015-08-30 ENCOUNTER — Encounter (HOSPITAL_COMMUNITY): Payer: Self-pay | Admitting: Clinical

## 2015-08-30 DIAGNOSIS — F331 Major depressive disorder, recurrent, moderate: Secondary | ICD-10-CM

## 2015-08-30 DIAGNOSIS — F411 Generalized anxiety disorder: Secondary | ICD-10-CM | POA: Diagnosis not present

## 2015-08-30 NOTE — Telephone Encounter (Signed)
Patient came by, Dr. Adele Schilder let her know that he was not increasing the Lamictal at this time

## 2015-09-04 ENCOUNTER — Telehealth (HOSPITAL_COMMUNITY): Payer: Self-pay

## 2015-09-04 DIAGNOSIS — F331 Major depressive disorder, recurrent, moderate: Secondary | ICD-10-CM

## 2015-09-04 NOTE — Telephone Encounter (Signed)
Patient calling, she states that it has been two weeks since her appointment and she would like to increase her Lamictal now, please review and advise, thank you

## 2015-09-05 MED ORDER — LAMOTRIGINE 200 MG PO TABS: 200.0000 mg | ORAL_TABLET | Freq: Every day | ORAL | Status: DC

## 2015-09-05 NOTE — Telephone Encounter (Signed)
Per Dr. Adele Schilder, I sent the order to patients pharmacy

## 2015-09-06 NOTE — Progress Notes (Signed)
   THERAPIST PROGRESS NOTE  Session Time: 3:44 - 4:45  Participation Level: Active  Behavioral Response: CasualAlertDepressed  Type of Therapy: Individual Therapy  Treatment Goals addressed: improve psychiatric symptoms, elevate mood, improve unhelpful thought patterns,   Interventions: CBT and Motivational Interviewing,   Summary: Raaga Xxx-Pichon is a 35 y.o. female who presents with Moderate episode of recurrent major depressive disorder, Generalized Anxiety Disorder. .   Suicidal/Homicidal: Nowithout intent/plan  Therapist ResponseAngus Palms met with clinician for an individual session. She discussed her psychiatric symptoms and her current life events. Sarayah shared that her vacation was good. She shared that she has been experiencing a lot of emotional numbness. Clinician suggested that she talk to her psychiatrist about it. She stated that she much preferred feeling numb to feeling overwhelmed.The only complaint she had is that she has recurring violent thoughts. She stated that she had no plan or not intention to act on them. She has had them off and on for a while.Jatziry and clinician discussed the thoughts and how to interrupt and redirect them. Adamaris shared that she would practice interrupting them and redirecting them until next session. Client and clinician discussed the positive things in her life and the things that bring her pleasure like her dog.    Plan: Return again in 1- 2 weeks.  Diagnosis:Axis I: Moderate episode of recurrent major depressive disorder, Generalized Anxiety Disorder.    Leelan Rajewski A, LCSW 08/24/2015

## 2015-09-19 ENCOUNTER — Encounter (HOSPITAL_COMMUNITY): Payer: Self-pay | Admitting: Clinical

## 2015-09-19 ENCOUNTER — Telehealth (HOSPITAL_COMMUNITY): Payer: Self-pay

## 2015-09-19 ENCOUNTER — Ambulatory Visit (INDEPENDENT_AMBULATORY_CARE_PROVIDER_SITE_OTHER): Payer: 59 | Admitting: Clinical

## 2015-09-19 DIAGNOSIS — F411 Generalized anxiety disorder: Secondary | ICD-10-CM

## 2015-09-19 DIAGNOSIS — F331 Major depressive disorder, recurrent, moderate: Secondary | ICD-10-CM

## 2015-09-19 MED ORDER — LAMOTRIGINE 200 MG PO TABS: 200.0000 mg | ORAL_TABLET | Freq: Every day | ORAL | Status: DC

## 2015-09-19 MED FILL — traZODone HCL 50 MG TABS: 50 | 30 days supply | Qty: 30 | Fill #0

## 2015-09-19 MED FILL — lamoTRIgine 200 MG TABS: 200 | 30 days supply | Qty: 30 | Fill #0

## 2015-09-19 MED FILL — CITALOPRAM HBR 40 MG TABLET: 40 | 30 days supply | Qty: 30 | Fill #0

## 2015-09-19 NOTE — Telephone Encounter (Signed)
Resent the lamictal, it was sent to the wrong pharmacy
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
## 2015-09-20 ENCOUNTER — Ambulatory Visit (HOSPITAL_COMMUNITY): Payer: Self-pay | Admitting: Psychiatry

## 2015-10-02 ENCOUNTER — Encounter: Payer: Self-pay | Admitting: Physician Assistant

## 2015-10-02 ENCOUNTER — Ambulatory Visit: Payer: Self-pay | Admitting: Physician Assistant

## 2015-10-02 VITALS — BP 110/70 | Temp 98.6°F

## 2015-10-02 DIAGNOSIS — H93299 Other abnormal auditory perceptions, unspecified ear: Secondary | ICD-10-CM | POA: Diagnosis not present

## 2015-10-02 DIAGNOSIS — T7029XA Other effects of high altitude, initial encounter: Secondary | ICD-10-CM

## 2015-10-02 DIAGNOSIS — T700XXA Otitic barotrauma, initial encounter: Secondary | ICD-10-CM | POA: Diagnosis not present
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
## 2015-10-10 ENCOUNTER — Telehealth (HOSPITAL_COMMUNITY): Payer: Self-pay

## 2015-10-10 ENCOUNTER — Other Ambulatory Visit (HOSPITAL_COMMUNITY): Payer: Self-pay | Admitting: Psychiatry

## 2015-10-10 ENCOUNTER — Emergency Department
Admission: EM | Admit: 2015-10-10 | Discharge: 2015-10-10 | Disposition: A | Discharge status: Home / Self Care | Payer: 59 | Attending: Emergency Medicine | Admitting: Emergency Medicine

## 2015-10-10 ENCOUNTER — Encounter: Payer: Self-pay | Admitting: Emergency Medicine

## 2015-10-10 DIAGNOSIS — R45851 Suicidal ideations: Secondary | ICD-10-CM | POA: Diagnosis not present

## 2015-10-10 DIAGNOSIS — Z79899 Other long term (current) drug therapy: Secondary | ICD-10-CM | POA: Diagnosis not present

## 2015-10-10 DIAGNOSIS — F332 Major depressive disorder, recurrent severe without psychotic features: Secondary | ICD-10-CM | POA: Diagnosis not present

## 2015-10-10 LAB — URINE DRUG SCREEN, QUALITATIVE (ARMC ONLY)
Amphetamines, Ur Screen: NOT DETECTED
BARBITURATES, UR SCREEN: NOT DETECTED
Benzodiazepine, Ur Scrn: NOT DETECTED
COCAINE METABOLITE, UR ~~LOC~~: NOT DETECTED
Cannabinoid 50 Ng, Ur ~~LOC~~: NOT DETECTED
MDMA (Ecstasy)Ur Screen: NOT DETECTED
METHADONE SCREEN, URINE: NOT DETECTED
OPIATE, UR SCREEN: NOT DETECTED
PHENCYCLIDINE (PCP) UR S: NOT DETECTED
Tricyclic, Ur Screen: NOT DETECTED

## 2015-10-10 LAB — POCT PREGNANCY, URINE: Preg Test, Ur: NEGATIVE

## 2015-10-10 LAB — PREGNANCY, URINE: Preg Test, Ur: NEGATIVE

## 2015-10-10 NOTE — Discharge Instructions (Signed)
Please return to the emergency department if you have thoughts of wanting to hurt herself or for any other concerns. It is very important that you follow up with her primary psychiatrist tomorrow to help you through the stress you're undergoing. Dr. Weber Cooks advises reducing your lamotrigine to 150 mg daily.  No-harm Safety Contract A no-harm safety contract is a written or verbal agreement between you and a mental health professional to promote safety. It contains specific actions and promises you agree to. The agreement also includes instructions from the therapist or doctor. The instructions will help prevent you from harming yourself or harming others. Harm can be as mild as pinching yourself, but can increase in intensity to actions like burning or cutting yourself. The extreme level of self-harm would be committing suicide. No-harm safety contracts are also sometimes referred to as a Radiographer, therapeutic, suicide Electrical engineer, no-harm agreements or decisions, or a Surveyor, mining.  REASONS FOR NO-HARM SAFETY CONTRACTS Safety contracts are just one part of an overall treatment plan to help keep you safe and free of harm. A safety contract may help to relieve anxiety, restore a sense of control, state clearly the alternatives to harm or suicide, and give you and your therapist or doctor a gauge for how you are doing in between visits. Many factors impact the decision to use a no-harm safety contract and its effectiveness. A proper overall treatment plan and evaluation and good patient understanding are the keys to good outcomes. CONTRACT ELEMENTS  A contract can range from simple to complex. They include all or some of the following:  Action statements. These are statements you agree to do or not do. Example: If I feel my life is becoming too difficult, I agree to do the following so there is no harm to myself or others:  Talk with family or friends.  Rid myself of all things that I could use  to harm myself.  Do an activity I enjoy or have enjoyed in the recent past. Coping strategies. These are ways to think and feel that decrease stress, such as:  Use of affirmations or positive statements about self.  Good self-care, including improved grooming, and healthy eating, and healthy sleeping patterns.  Increase physical exercise.  Increase social involvement.  Focus on positive aspects of life. Crisis management. This would include what to do if there was trouble following the contract or an urge to harm. This might include notifying family or your therapist of suicidal thoughts. Be open and honest about suicidal urges. To prevent a crisis, do the following:  List reasons to reach out for support.  Keep contact numbers and available hours handy. Treatment goals. These are goals would include no suicidal thoughts, improved mood, and feelings of hopefulness. Listed responsibilities of different people involved in care. This could include family members. A family member may agree to remove firearms or other lethal weapons/substances from your ease of access. A timeline. A timeline can be in place from one therapy session to the next session. HOME CARE INSTRUCTIONS   Follow your no-harm safety contract.  Contact your therapist and/or doctor if you have any questions or concerns. MAKE SURE YOU:   Understand these instructions.  Will watch your condition. Noticing any mood changes or suicidal urges.  Will get help right away if you are not doing well or get worse.   This information is not intended to replace advice given to you by your health care provider. Make sure you discuss any questions  you have with your health care provider.   Document Released: 10/17/2009 Document Revised: 05/20/2014 Document Reviewed: 10/17/2009 Elsevier Interactive Patient Education 2016 [employer].  Suicidal Feelings: How to Help Yourself Suicide is the taking of one's own life. If you feel  as though life is getting too tough to handle and are thinking about suicide, get help right away. To get help:  Call your local emergency services (911 in the [place]  Call a suicide hotline to speak with a trained counselor who understands how you are feeling. The following is a list of suicide hotlines in the [place]. For a list of hotlines in [place], visit FindSkins.pl.  1-800-273-TALK 848-720-5910).  1-800-SUICIDE (754)103-3925).  (612) 560-2039. This is a hotline for Spanish speakers.  0-626-948-5IOE 986-196-5683). This is a hotline for TTY users.  1-866-4-U-TREVOR 918-402-7664). This is a hotline for lesbian, gay, bisexual, transgender, or questioning youth.  Contact a crisis center or a local suicide prevention center. To find a crisis center or suicide prevention center:  Call your local hospital, clinic, community service organization, mental health center, social service provider, or health department. Ask for assistance in connecting to a crisis center.  Visit BankingRep.com.au for a list of crisis centers in the [place], or visit www.suicideprevention.ca/thinking-about-suicide/find-a-crisis-centre for a list of centers in [place].  Visit the following websites:  National Suicide Prevention Lifeline: www.suicidepreventionlifeline.org  Hopeline: www.hopeline.[place] for Suicide Prevention: PromotionalLoans.co.za  The [employer] (for lesbian, gay, bisexual, transgender, or questioning youth): www.thetrevorproject.org HOW CAN I HELP MYSELF FEEL BETTER?  Promise yourself that you will not do anything drastic when you have suicidal feelings. Remember, there is hope. Many people have gotten through suicidal thoughts and feelings, and you will, too. You may have gotten through them before, and this proves that you can get through them again.  Let  family, friends, teachers, or counselors know how you are feeling. Try not to isolate yourself from those who care about you. Remember, they will want to help you. Talk with someone every day, even if you do not feel sociable. Face-to-face conversation is best.  Call a mental health professional and see one regularly.  Visit your primary health care provider every year.  Eat a well-balanced diet, and space your meals so you eat regularly.  Get plenty of rest.  Avoid alcohol and drugs, and remove them from your home. They will only make you feel worse.  If you are thinking of taking a lot of medicine, give your medicine to someone who can give it to you one day at a time. If you are on antidepressants and are concerned you will overdose, let your health care provider know so he or she can give you safer medicines. Ask your mental health professional about the possible side effects of any medicines you are taking.  Remove weapons, poisons, knives, and anything else that could harm you from your home.  Try to stick to routines. Follow a schedule every day. Put self-care on your schedule.  Make a list of realistic goals, and cross them off when you achieve them. Accomplishments give a sense of worth.  Wait until you are feeling better before doing the things you find difficult or unpleasant.  Exercise if you are able. You will feel better if you exercise for even a half hour each day.  Go out in the sun or into nature. This will help you recover from depression faster. If you have a favorite place to walk, go there.  Do the things that have always given you pleasure. Play your favorite music, read a good book, paint a picture, play your favorite instrument, or do anything else that takes your mind off your depression if it is safe to do.  Keep your living space well lit.  When you are feeling well, write yourself a letter about tips and support that you can read when you are not feeling  well.  Remember that life's difficulties can be sorted out with help. Conditions can be treated. You can work on thoughts and strategies that serve you well.   This information is not intended to replace advice given to you by your health care provider. Make sure you discuss any questions you have with your health care provider.   Document Released: 11/03/2002 Document Revised: 05/20/2014 Document Reviewed: 08/24/2013 Elsevier Interactive Patient Education 2016 [place] [address] and Stress Management Stress is a normal reaction to life events. It is what you feel when life demands more than you are used to or more than you can handle. Some stress can be useful. For example, the stress reaction can help you catch the last bus of the day, study for a test, or meet a deadline at work. But stress that occurs too often or for too long can cause problems. It can affect your emotional health and interfere with relationships and normal daily activities. Too much stress can weaken your immune system and increase your risk for physical illness. If you already have a medical problem, stress can make it worse. CAUSES  All sorts of life events may cause stress. An event that causes stress for one person may not be stressful for another person. Major life events commonly cause stress. These may be positive or negative. Examples include losing your job, moving into a new home, getting married, having a baby, or losing a loved one. Less obvious life events may also cause stress, especially if they occur day after day or in combination. Examples include working long hours, driving in traffic, caring for children, being in debt, or being in a difficult relationship. SIGNS AND SYMPTOMS Stress may cause emotional symptoms including, the following:  Anxiety. This is feeling worried, afraid, on edge, overwhelmed, or out of control.  Anger. This is feeling irritated or impatient.  Depression. This is feeling sad,  down, helpless, or guilty.  Difficulty focusing, remembering, or making decisions. Stress may cause physical symptoms, including the following:   Aches and pains. These may affect your head, neck, back, stomach, or other areas of your body.  Tight muscles or clenched jaw.  Low energy or trouble sleeping. Stress may cause unhealthy behaviors, including the following:   Eating to feel better (overeating) or skipping meals.  Sleeping too little, too much, or both.  Working too much or putting off tasks (procrastination).  Smoking, drinking alcohol, or using drugs to feel better. DIAGNOSIS  Stress is diagnosed through an assessment by your health care provider. Your health care provider will ask questions about your symptoms and any stressful life events.Your health care provider will also ask about your medical history and may order blood tests or other tests. Certain medical conditions and medicine can cause physical symptoms similar to stress. Mental illness can cause emotional symptoms and unhealthy behaviors similar to stress. Your health care provider may refer you to a mental health professional for further evaluation.  TREATMENT  Stress management is the recommended treatment for stress.The goals of stress management are reducing stressful life events  and coping with stress in healthy ways.  Techniques for reducing stressful life events include the following:  Stress identification. Self-monitor for stress and identify what causes stress for you. These skills may help you to avoid some stressful events.  Time management. Set your priorities, keep a calendar of events, and learn to say "no." These tools can help you avoid making too many commitments. Techniques for coping with stress include the following:  Rethinking the problem. Try to think realistically about stressful events rather than ignoring them or overreacting. Try to find the positives in a stressful situation rather  than focusing on the negatives.  Exercise. Physical exercise can release both physical and emotional tension. The key is to find a form of exercise you enjoy and do it regularly.  Relaxation techniques. These relax the body and mind. Examples include yoga, meditation, tai chi, biofeedback, deep breathing, progressive muscle relaxation, listening to music, being out in nature, journaling, and other hobbies. Again, the key is to find one or more that you enjoy and can do regularly.  Healthy lifestyle. Eat a balanced diet, get plenty of sleep, and do not smoke. Avoid using alcohol or drugs to relax.  Strong support network. Spend time with family, friends, or other people you enjoy being around.Express your feelings and talk things over with someone you trust. Counseling or talktherapy with a mental health professional may be helpful if you are having difficulty managing stress on your own. Medicine is typically not recommended for the treatment of stress.Talk to your health care provider if you think you need medicine for symptoms of stress. HOME CARE INSTRUCTIONS  Keep all follow-up visits as directed by your health care provider.  Take all medicines as directed by your health care provider. SEEK MEDICAL CARE IF:  Your symptoms get worse or you start having new symptoms.  You feel overwhelmed by your problems and can no longer manage them on your own. SEEK IMMEDIATE MEDICAL CARE IF:  You feel like hurting yourself or someone else.   This information is not intended to replace advice given to you by your health care provider. Make sure you discuss any questions you have with your health care provider.   Document Released: 10/23/2000 Document Revised: 05/20/2014 Document Reviewed: 12/22/2012 Elsevier Interactive Patient Education [employer].
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
## 2015-10-10 NOTE — Telephone Encounter (Signed)
I called the patient and let her know.

## 2015-10-10 NOTE — Telephone Encounter (Signed)
Patient is calling, she states she did not sleep at all last night, she took her Trazodone and she also took tramadol. She said she laid awake all night and would like to know if there is anything you would recommend.Please review and advise, thank you

## 2015-10-10 NOTE — Telephone Encounter (Signed)
If her insomnia continued to persist than I will consider changing her medication.
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
## 2015-10-12 ENCOUNTER — Ambulatory Visit (HOSPITAL_COMMUNITY): Payer: Self-pay | Admitting: Clinical

## 2015-10-13 ENCOUNTER — Ambulatory Visit (INDEPENDENT_AMBULATORY_CARE_PROVIDER_SITE_OTHER): Payer: 59 | Admitting: Psychiatry

## 2015-10-13 ENCOUNTER — Encounter (HOSPITAL_COMMUNITY): Payer: Self-pay | Admitting: Psychiatry

## 2015-10-13 VITALS — BP 110/66 | HR 75 | Ht 64.6 in | Wt 152.0 lb

## 2015-10-13 DIAGNOSIS — F331 Major depressive disorder, recurrent, moderate: Secondary | ICD-10-CM | POA: Diagnosis not present

## 2015-10-13 MED ORDER — HYDROXYZINE HCL 10 MG PO TABS: 10.0000 mg | ORAL_TABLET | Freq: Every day | ORAL | Status: DC

## 2015-10-13 MED ORDER — ARIPIPRAZOLE 5 MG PO TABS: 5.0000 mg | ORAL_TABLET | Freq: Every day | ORAL | Status: DC

## 2015-10-13 MED ORDER — TRAZODONE HCL 50 MG PO TABS: 50.0000 mg | ORAL_TABLET | Freq: Every day | ORAL | Status: DC

## 2015-10-13 MED FILL — traZODone HCL 50 MG TABS: 50 | 30 days supply | Qty: 30 | Fill #0

## 2015-10-13 MED FILL — ARIPiprazole 5 MG TABS: 5 | 30 days supply | Qty: 30 | Fill #0

## 2015-10-13 MED FILL — hydrOXYzine HCL 10 MG TABS: 10 | 30 days supply | Qty: 30 | Fill #0
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
## 2015-10-16 ENCOUNTER — Ambulatory Visit (HOSPITAL_COMMUNITY): Payer: Self-pay | Admitting: Clinical

## 2015-10-18 ENCOUNTER — Ambulatory Visit (HOSPITAL_COMMUNITY): Payer: Self-pay | Admitting: Psychiatry

## 2015-10-19 ENCOUNTER — Encounter (HOSPITAL_COMMUNITY): Payer: Self-pay | Admitting: Clinical

## 2015-10-19 ENCOUNTER — Ambulatory Visit (INDEPENDENT_AMBULATORY_CARE_PROVIDER_SITE_OTHER): Payer: 59 | Admitting: Clinical

## 2015-10-19 DIAGNOSIS — F3162 Bipolar disorder, current episode mixed, moderate: Secondary | ICD-10-CM
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
## 2015-10-19 NOTE — Progress Notes (Signed)
THERAPIST PROGRESS NOTE  Session Time: 8: 06 - 9:05  Participation Level: Active  Behavioral Response: CasualAlertNA  Type of Therapy: Individual Therapy  Treatment Goals addressed: improve psychiatric symptoms, elevate mood ( increased hopefulness), improve unhelpful thought patterns,   Interventions: CBT and Motivational Interviewing psychoeducation,   Summary: Nicole Wallace is a 35 y.o. female who presents with Bipolar.  Suicidal/Homicidal: No - without intent/plan  Therapist Response: Afsa met with clinician for an individual session. Nicole Wallace and clinician reviewed and discussed her assessment. Her diagnosis was updated to Bipolar I, mixed moderate. Nicole Wallace discussed her current life events, her psychiatric symptoms, and her homework.  Nicole Wallace shared that she was fired Friday. She shared about her experience at her job and her belief that she was being discriminated against because of her race. Nicole Wallace shared that she was put on administrative leave on Wednesday. She stated that she became manic and also experienced a panic attack. She shared that she threatened suicide and went to the hospital. She was inpatient for only a few hours before being released. Nicole Wallace shared that on Friday she was fired. She shared about the steps she took after. She shared that her parents have been supportive of her which has helped a lot. Nicole Wallace shared that she has been calmer and looking for a job. Nicole Wallace and clinician reviewed her homework packets Bipolar 1&2. Nicole Wallace shared her thoughts and insights about the packets, her diagnosis. Client and clinician discussed healthy coping. Client and clinician discussed how to challenge her negative thoughts and improve her mood by actively choosing where she focus her attention. Nicole Wallace agreed to complete homework packet 3 Bipolar and bring it with her to next session.  Nicole Wallace denied any suicidal or homicidal ideation.     Plan: Return again in 1  weeks.  Diagnosis:     Axis I: Bipolar I, mixed moderate   Jamy Cleckler A, LCSW 10/19/2015

## 2015-10-24 ENCOUNTER — Encounter (HOSPITAL_COMMUNITY): Payer: Self-pay | Admitting: Clinical

## 2015-10-24 ENCOUNTER — Ambulatory Visit (INDEPENDENT_AMBULATORY_CARE_PROVIDER_SITE_OTHER): Payer: 59 | Admitting: Clinical

## 2015-10-24 DIAGNOSIS — F3162 Bipolar disorder, current episode mixed, moderate: Secondary | ICD-10-CM

## 2015-10-24 NOTE — Progress Notes (Addendum)
   THERAPIST PROGRESS NOTE  Session Time: 3:37 -4:33  Participation Level: Active  Behavioral Response: CasualAlertDepressed  Type of Therapy: Individual Therapy  Treatment Goals addressed: improve psychiatric symptoms, elevate mood ( increased hopefulness), improve unhelpful thought patterns,  Interventions: CBT and Motivational Interviewing, psychoeducation,   Summary: Nicole Wallace is a 35 y.o. female who presents with Bipolar I, mixed moderate  Suicidal/Homicidal: No - without intent/plan  Therapist Response: Nicole Wallace met with clinician for an individual session. Nicole Wallace discussed her current life events, her psychiatric symptoms, and her homework. Nature shared that she has been doing okay. She shared she has had some ups and downs. Nicole Wallace shared about the events that she felt affected her mood. Client and clinician discussed her thoughts and emotions. Client and clinician discussed how our thoughts inform our emotions and  How to challenge them. Nicole Wallace shared about some angry thoughts she had about her boyfriend's ex. Client and clinician discussed her negative thoughts. Client and clinician discussed whether or not she would act on them which she denied ( which were fighting with her). She denied any past violence.Client and clinician discussed how to change her thoughts to things that would better benefit her. Nicole Wallace shared that she had worked on but not  Complete  Module #3 Bipolar. She brought what she had complete and client and clinician reviewed and discussed it.  Clinician gave her packet #4 which she agreed to complete along with #3 and bring them with her to next session. Nicole Wallace agreed to practice her grounding and mindfulness techniques    Plan: Return again in 1 weeks.  Diagnosis:     Axis I: Bipolar I, mixed moderate   Brianda Beitler A, LCSW 10/24/2015

## 2015-10-25 ENCOUNTER — Ambulatory Visit (INDEPENDENT_AMBULATORY_CARE_PROVIDER_SITE_OTHER): Payer: 59 | Admitting: Psychiatry

## 2015-10-25 ENCOUNTER — Encounter (HOSPITAL_COMMUNITY): Payer: Self-pay | Admitting: Psychiatry

## 2015-10-25 VITALS — BP 116/68 | HR 63 | Ht 65.0 in | Wt 154.4 lb

## 2015-10-25 DIAGNOSIS — F331 Major depressive disorder, recurrent, moderate: Secondary | ICD-10-CM | POA: Diagnosis not present

## 2015-10-25 DIAGNOSIS — F1010 Alcohol abuse, uncomplicated: Secondary | ICD-10-CM | POA: Diagnosis not present

## 2015-10-25 MED ORDER — TRAZODONE HCL 50 MG PO TABS: 50.0000 mg | ORAL_TABLET | Freq: Every day | ORAL | Status: DC

## 2015-10-25 MED ORDER — ARIPIPRAZOLE 5 MG PO TABS: 5.0000 mg | ORAL_TABLET | Freq: Every day | ORAL | Status: DC

## 2015-10-25 MED ORDER — CITALOPRAM HYDROBROMIDE 40 MG PO TABS: 40.0000 mg | ORAL_TABLET | Freq: Every day | ORAL | Status: DC
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
## 2015-11-01 ENCOUNTER — Ambulatory Visit (HOSPITAL_COMMUNITY): Payer: Self-pay | Admitting: Clinical

## 2015-11-03 ENCOUNTER — Encounter: Payer: 59 | Admitting: Gynecology

## 2015-11-06 ENCOUNTER — Ambulatory Visit (HOSPITAL_COMMUNITY): Payer: Self-pay | Admitting: Psychiatry

## 2015-12-06 ENCOUNTER — Ambulatory Visit (HOSPITAL_COMMUNITY): Payer: Self-pay | Admitting: Psychiatry

## 2016-01-04 ENCOUNTER — Ambulatory Visit (INDEPENDENT_AMBULATORY_CARE_PROVIDER_SITE_OTHER): Payer: Self-pay | Admitting: Clinical

## 2016-01-04 DIAGNOSIS — F3162 Bipolar disorder, current episode mixed, moderate: Secondary | ICD-10-CM

## 2016-01-04 NOTE — Progress Notes (Signed)
   THERAPIST PROGRESS NOTE  Session Time: 11:02 -11:59  Participation Level: Active  Behavioral Response: CasualAlertAnxious and Depressed  Type of Therapy: Individual Therapy  Treatment Goals addressed: improve psychiatric symptoms, elevate mood (improved self-esteem and increased hopefulness), improve unhelpful thought patterns, reduce irrational fears and worries (interpret ordinary events and situations)  Interventions: CBT and Motivational Interviewing psychoeducation, grounding and mindfulness techniques  Summary: Nicole Wallace is a 35 y.o. female who presents with Bipolar I, mixed moderate.  Suicidal/Homicidal: No - without intent/plan  Therapist Response: Euva met with clinician for an individual session. [name] discussed her current life events, her psychiatric symptoms, and her homework. [name] shared that she is not taking her Abilify and Celexa for the last 2 weeks. She shared that she is been kind of on a roller coaster of emotions and has been feeling angrier. 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 She shared that she's had some passive suicidal thoughts she denied any current suicidal or homicidal thoughts. Clinician asked open ended questions a bout how she was managing her thoughts. [name] said she was using grounding techniques and was using her CBT skills. Clinician encouraged now to speak with her doctor and also to stop by and speak with the nurse on her way out. She agreed to do so. [name] shared that she had completed her bipolar packet 4. Client and clinician discussed behavioral strategies for depression. Clinician discussed how her thoughts become more depressive since quitting her medication. Client and clinician discussed the importance of both medication and healthy coping skills client and clinician reviewed some of those coping skills such as grounding and  mindfulness techniques, challenging unhelpful thoughts, healthy eating, exercise, and sleep. [name] shared one of the negative thoughts that [place] which had to do with her ability to provide for herself. Client and clinician used a 7 panel thought record sheet to challenge her negative thoughts. [name] was then able to formulate healthier alternative thoughts. She reported feeling less anxious and less depressed after the process.  Plan: Return again in 1 weeks.  Diagnosis:     Axis I: Bipolar I, mixed moderate   Kyngston Pickelsimer A, LCSW 01/04/2016

## 2016-01-10 ENCOUNTER — Other Ambulatory Visit (HOSPITAL_COMMUNITY): Payer: Self-pay

## 2016-01-10 DIAGNOSIS — F331 Major depressive disorder, recurrent, moderate: Secondary | ICD-10-CM

## 2016-01-10 MED ORDER — CITALOPRAM HYDROBROMIDE 40 MG PO TABS
40.0000 mg | ORAL_TABLET | Freq: Every day | ORAL | 1 refills | Status: DC
Start: 2016-01-10 — End: 2016-01-25

## 2016-01-10 NOTE — Progress Notes (Signed)
Patient called, she is really upset by the weight she has gained. Patient gained 16 lbs in 2 months. She states she can not leave her house because she has nothing to wear. Patient stopped taking all medications. I spoke with Dr. Lovena Le and he said that the Abilify is most likely to be causing the weight gain, he told me to have patient restart her medication, but not the Abilify. I called patient and let her know that she should restart all but the Abilify. I told patient that she would have to talk to Dr. Adele Schilder about the weight loss medication.

## 2016-01-11 ENCOUNTER — Encounter (HOSPITAL_COMMUNITY): Payer: Self-pay | Admitting: Clinical

## 2016-01-25 ENCOUNTER — Encounter (HOSPITAL_COMMUNITY): Payer: Self-pay | Admitting: Psychiatry

## 2016-01-25 ENCOUNTER — Ambulatory Visit (INDEPENDENT_AMBULATORY_CARE_PROVIDER_SITE_OTHER): Payer: Self-pay | Admitting: Psychiatry

## 2016-01-25 DIAGNOSIS — F331 Major depressive disorder, recurrent, moderate: Secondary | ICD-10-CM

## 2016-01-25 MED ORDER — DIVALPROEX SODIUM 250 MG PO DR TAB: DELAYED_RELEASE_TABLET | ORAL | 1 refills | Status: DC

## 2016-01-25 MED ORDER — CITALOPRAM HYDROBROMIDE 40 MG PO TABS: 40.0000 mg | ORAL_TABLET | Freq: Every day | ORAL | 1 refills | Status: DC

## 2016-01-25 MED ORDER — HYDROXYZINE HCL 10 MG PO TABS: 10.0000 mg | ORAL_TABLET | Freq: Every day | ORAL | 0 refills | Status: DC

## 2016-01-25 MED ORDER — TRAZODONE HCL 50 MG PO TABS: 50.0000 mg | ORAL_TABLET | Freq: Every day | ORAL | 1 refills | Status: DC
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
## 2016-02-07 ENCOUNTER — Ambulatory Visit (HOSPITAL_COMMUNITY): Payer: Self-pay | Admitting: Clinical

## 2016-02-12 ENCOUNTER — Ambulatory Visit (INDEPENDENT_AMBULATORY_CARE_PROVIDER_SITE_OTHER): Payer: Self-pay | Admitting: Clinical

## 2016-02-12 DIAGNOSIS — F3162 Bipolar disorder, current episode mixed, moderate: Secondary | ICD-10-CM

## 2016-02-12 NOTE — Progress Notes (Signed)
   THERAPIST PROGRESS NOTE  Session Time: 8:15- 9:00   Participation Level: Active  Behavioral Response: CasualAlertNA  Type of Therapy: Individual Therapy  Treatment Goals addressed: improve psychiatric symptoms, elevate mood , improve unhelpful thought patterns,   Interventions:  Motivational Interviewing psychoeducation,  Summary: Nicole Wallace is a 35 y.o. female who presents with Bipolar I, mixed moderate  Suicidal/Homicidal: No - without intent/plan  Therapist Response: Nicole Wallace met with clinician for an individual session. Nicole Wallace discussed her current life events and her psychiatric symptoms. Nicole Wallace shared that she has been isolating and has had some depression and anxiety. She shared that she is spending a lot of time in bed when not leaving her house. She shared since she is isolating she is not experiencing as much anger and it is likely her medication is working better because she is not having the obsessive thoughts. Clinician asked open ended questions and Nicole Wallace shared that she did not think she was depressed. She shared that she felt safer not interacting with others. Clinician encouraged her to get out of the house more so that her depression didn't deepened. Marye stated that she has a new job and she'll start next week and that she will be forced to interact at that time. Clinician asked open ended questions and Nicole Wallace shared a bout her relationships and how they affect her thoughts and emotions. Clinician asked open ended questions about things that Nicole Wallace could do outside of her house that would improve her mood. Nicole Wallace shared a bout going to the gym and seeing her boyfriend.  Plan: Return again in 1 weeks.  Diagnosis:     Axis I: Bipolar I, mixed moderate    Delray Reza A, LCSW 02/12/2016

## 2016-02-19 ENCOUNTER — Encounter (HOSPITAL_COMMUNITY): Payer: Self-pay | Admitting: Clinical

## 2016-03-07 ENCOUNTER — Ambulatory Visit (HOSPITAL_COMMUNITY): Payer: Self-pay | Admitting: Psychiatry
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
## 2016-03-18 ENCOUNTER — Ambulatory Visit (HOSPITAL_COMMUNITY): Payer: Self-pay | Admitting: Psychiatry

## 2016-04-29 ENCOUNTER — Other Ambulatory Visit (HOSPITAL_COMMUNITY): Payer: Self-pay | Admitting: Psychiatry

## 2016-04-29 DIAGNOSIS — F331 Major depressive disorder, recurrent, moderate: Secondary | ICD-10-CM

## 2016-05-01 NOTE — Telephone Encounter (Signed)
Met with Dr. Adele Schilder who approved a one time order for patient's Depakote 261m DR, 2 a day, #60 with no refills and e-scribed to patient's [place] [facility] approved.  Placed on order no further refills until patient evaluated and requested pharmacy have patient call to schedule an appointment per Dr. AMarguerite Olearequest.

## 2016-06-17 ENCOUNTER — Encounter (HOSPITAL_COMMUNITY): Payer: Self-pay | Admitting: Clinical

## 2016-06-17 ENCOUNTER — Ambulatory Visit (INDEPENDENT_AMBULATORY_CARE_PROVIDER_SITE_OTHER): Payer: Self-pay | Admitting: Clinical

## 2016-06-17 DIAGNOSIS — F3162 Bipolar disorder, current episode mixed, moderate: Secondary | ICD-10-CM

## 2016-06-17 NOTE — Progress Notes (Signed)
   THERAPIST PROGRESS NOTE  Session Time: 7:17 - 8:02   Participation Level: Active  Behavioral Response: CasualAlertDepressed  Type of Therapy: Individual Therapy  Treatment Goals addressed: improve psychiatric symptoms, elevate mood, improve unhelpful thought patterns,   Interventions: CBT and Motivational Interviewing  grounding and mindfulness techniques  Summary: Nicole Wallace is a 36 y.o. female who presents with Bipolar I, mixed moderate  Suicidal/Homicidal: No - without intent/plan  Therapist Response: Nicole Wallace met with clinician for an individual session. Nicole Wallace discussed her current life events and  her psychiatric symptoms. Nicole Wallace and clinician worked together to update her assessment. She shared that she has been doing better with he anger but that she  has  lot of depression. Clinician asked open ended questions and Nicole Wallace shared that her job has been very stressful. Client and clinician reviewed grounding and mindfulness techniques. Client and clinician discussed the process, purpose and practice of the techniques. Client and clinician reviewed basic cbt concepts. Client and clinician discussed the thought emotion connection. Clinician gave Nicole Wallace a homework packet which she agreed to complete before next session.  Plan: Return again in 1 weeks.  Diagnosis:     Axis I: Bipolar I, mixed moderate    Nicole Wallace A, LCSW 06/17/2016
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
## 2016-06-27 ENCOUNTER — Ambulatory Visit (HOSPITAL_COMMUNITY): Payer: Self-pay | Admitting: Clinical

## 2016-06-28 ENCOUNTER — Ambulatory Visit (INDEPENDENT_AMBULATORY_CARE_PROVIDER_SITE_OTHER): Payer: Self-pay | Admitting: Psychiatry

## 2016-06-28 ENCOUNTER — Encounter (HOSPITAL_COMMUNITY): Payer: Self-pay | Admitting: Psychiatry

## 2016-06-28 DIAGNOSIS — Z818 Family history of other mental and behavioral disorders: Secondary | ICD-10-CM

## 2016-06-28 DIAGNOSIS — Z8269 Family history of other diseases of the musculoskeletal system and connective tissue: Secondary | ICD-10-CM

## 2016-06-28 DIAGNOSIS — Z8349 Family history of other endocrine, nutritional and metabolic diseases: Secondary | ICD-10-CM

## 2016-06-28 DIAGNOSIS — F331 Major depressive disorder, recurrent, moderate: Secondary | ICD-10-CM

## 2016-06-28 DIAGNOSIS — Z8249 Family history of ischemic heart disease and other diseases of the circulatory system: Secondary | ICD-10-CM

## 2016-06-28 DIAGNOSIS — Z9889 Other specified postprocedural states: Secondary | ICD-10-CM

## 2016-06-28 DIAGNOSIS — Z801 Family history of malignant neoplasm of trachea, bronchus and lung: Secondary | ICD-10-CM

## 2016-06-28 DIAGNOSIS — Z8261 Family history of arthritis: Secondary | ICD-10-CM

## 2016-06-28 DIAGNOSIS — Z9114 Patient's other noncompliance with medication regimen: Secondary | ICD-10-CM

## 2016-06-28 DIAGNOSIS — Z841 Family history of disorders of kidney and ureter: Secondary | ICD-10-CM

## 2016-06-28 DIAGNOSIS — Z833 Family history of diabetes mellitus: Secondary | ICD-10-CM

## 2016-06-28 DIAGNOSIS — Z9109 Other allergy status, other than to drugs and biological substances: Secondary | ICD-10-CM

## 2016-06-28 DIAGNOSIS — Z79899 Other long term (current) drug therapy: Secondary | ICD-10-CM

## 2016-06-28 DIAGNOSIS — Z825 Family history of asthma and other chronic lower respiratory diseases: Secondary | ICD-10-CM

## 2016-06-28 DIAGNOSIS — F1021 Alcohol dependence, in remission: Secondary | ICD-10-CM

## 2016-06-28 MED ORDER — TRAZODONE HCL 50 MG PO TABS: 50.0000 mg | ORAL_TABLET | Freq: Every day | ORAL | 0 refills | Status: DC

## 2016-06-28 MED ORDER — HYDROXYZINE HCL 10 MG PO TABS
10.0000 mg | ORAL_TABLET | Freq: Every day | ORAL | 0 refills | Status: DC
Start: 2016-06-28 — End: 2017-08-15

## 2016-06-28 MED ORDER — DIVALPROEX SODIUM 250 MG PO DR TAB: 250.0000 mg | DELAYED_RELEASE_TABLET | Freq: Every day | ORAL | 2 refills | Status: DC
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
## 2016-07-04 ENCOUNTER — Ambulatory Visit (HOSPITAL_COMMUNITY): Payer: Self-pay | Admitting: Clinical

## 2016-07-11 ENCOUNTER — Ambulatory Visit (HOSPITAL_COMMUNITY): Payer: Self-pay | Admitting: Clinical

## 2016-07-18 ENCOUNTER — Ambulatory Visit (HOSPITAL_COMMUNITY): Payer: Self-pay | Admitting: Clinical

## 2016-07-25 ENCOUNTER — Ambulatory Visit (HOSPITAL_COMMUNITY): Payer: Self-pay | Admitting: Clinical

## 2016-08-13 ENCOUNTER — Other Ambulatory Visit (HOSPITAL_COMMUNITY): Payer: Self-pay | Admitting: Psychiatry

## 2016-08-17 NOTE — Telephone Encounter (Signed)
Patient contacted me via the answering service that she has run out of citalopram. She contacted her psychiatrist's office, but has not heard back, and is beginning to panic. She has taken 1/2 tablet daily for several days, in hopes of making it stretch. Last 1/2 tablet for tomorrow morning (Saturday).  Meds ordered this encounter  Medications  . citalopram (CELEXA) 40 MG tablet    Sig: TAKE ONE TABLET BY MOUTH ONCE DAILY FOR  DEPRESSION    Dispense:  90 tablet    Refill:  3    Please consider 90 day supplies to promote better adherence

## 2016-10-09 ENCOUNTER — Telehealth (HOSPITAL_COMMUNITY): Payer: Self-pay

## 2016-10-09 NOTE — Telephone Encounter (Signed)
Patient is calling, she was supposed to come in on June 4th, however she is in a new job and will need to reschedule. Patient called me today because she is going through a break up and she is on edge. Patient states that she is having homicidal and suicidal thoughts. I advised patient to go to [facility] for a psych eval, she refused. She wants to know if you can increase her Depakote. Please revew and advise, thank you

## 2016-10-10 ENCOUNTER — Other Ambulatory Visit (HOSPITAL_COMMUNITY): Payer: Self-pay | Admitting: Psychiatry

## 2016-10-10 DIAGNOSIS — F331 Major depressive disorder, recurrent, moderate: Secondary | ICD-10-CM

## 2016-10-10 MED ORDER — DIVALPROEX SODIUM 250 MG PO DR TAB: 250.0000 mg | DELAYED_RELEASE_TABLET | Freq: Two times a day (BID) | ORAL | 0 refills | Status: DC

## 2016-10-10 NOTE — Telephone Encounter (Signed)
I called her back and left a message to return our call.

## 2016-10-10 NOTE — Telephone Encounter (Signed)
Please call patient on her work phone, [phone]. She said it is her direct line, she can not use her cell in the hospital.
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
## 2016-10-14 ENCOUNTER — Ambulatory Visit (HOSPITAL_COMMUNITY): Payer: Self-pay | Admitting: Psychiatry

## 2016-10-19 ENCOUNTER — Ambulatory Visit (INDEPENDENT_AMBULATORY_CARE_PROVIDER_SITE_OTHER): Payer: BLUE CROSS/BLUE SHIELD | Admitting: Physician Assistant

## 2016-10-19 ENCOUNTER — Encounter: Payer: Self-pay | Admitting: Physician Assistant

## 2016-10-19 VITALS — BP 104/70 | HR 60 | Temp 98.6°F | Resp 16 | Ht 64.0 in | Wt 147.0 lb

## 2016-10-19 DIAGNOSIS — Z113 Encounter for screening for infections with a predominantly sexual mode of transmission: Secondary | ICD-10-CM

## 2016-10-19 DIAGNOSIS — Z1329 Encounter for screening for other suspected endocrine disorder: Secondary | ICD-10-CM

## 2016-10-19 DIAGNOSIS — Z7184 Encounter for health counseling related to travel: Secondary | ICD-10-CM

## 2016-10-19 DIAGNOSIS — Z13228 Encounter for screening for other metabolic disorders: Secondary | ICD-10-CM | POA: Diagnosis not present

## 2016-10-19 DIAGNOSIS — G43009 Migraine without aura, not intractable, without status migrainosus: Secondary | ICD-10-CM

## 2016-10-19 DIAGNOSIS — K429 Umbilical hernia without obstruction or gangrene: Secondary | ICD-10-CM

## 2016-10-19 DIAGNOSIS — Z0000 Encounter for general adult medical examination without abnormal findings: Secondary | ICD-10-CM

## 2016-10-19 DIAGNOSIS — Z1389 Encounter for screening for other disorder: Secondary | ICD-10-CM

## 2016-10-19 DIAGNOSIS — Z124 Encounter for screening for malignant neoplasm of cervix: Secondary | ICD-10-CM | POA: Diagnosis not present

## 2016-10-19 DIAGNOSIS — Z130 Encounter for screening for diseases of the blood and blood-forming organs and certain disorders involving the immune mechanism: Secondary | ICD-10-CM

## 2016-10-19 DIAGNOSIS — B009 Herpesviral infection, unspecified: Secondary | ICD-10-CM | POA: Insufficient documentation

## 2016-10-19 DIAGNOSIS — F332 Major depressive disorder, recurrent severe without psychotic features: Secondary | ICD-10-CM

## 2016-10-19 DIAGNOSIS — Z7189 Other specified counseling: Secondary | ICD-10-CM

## 2016-10-19 DIAGNOSIS — Z23 Encounter for immunization: Secondary | ICD-10-CM | POA: Diagnosis not present

## 2016-10-19 DIAGNOSIS — Z13220 Encounter for screening for lipoid disorders: Secondary | ICD-10-CM

## 2016-10-19 DIAGNOSIS — K5900 Constipation, unspecified: Secondary | ICD-10-CM | POA: Diagnosis not present

## 2016-10-19 LAB — POCT URINALYSIS DIP (MANUAL ENTRY)
Bilirubin, UA: NEGATIVE
Glucose, UA: NEGATIVE mg/dL
Ketones, POC UA: NEGATIVE mg/dL
LEUKOCYTES UA: NEGATIVE
NITRITE UA: NEGATIVE
PH UA: 8.5 — AB (ref 5.0–8.0)
PROTEIN UA: NEGATIVE mg/dL
RBC UA: NEGATIVE
Spec Grav, UA: 1.01 (ref 1.010–1.025)
UROBILINOGEN UA: 0.2 U/dL

## 2016-10-19 MED ORDER — VALACYCLOVIR HCL 1 G PO TABS: 1000.0000 mg | ORAL_TABLET | Freq: Two times a day (BID) | ORAL | 3 refills | Status: DC

## 2016-10-19 MED ORDER — TYPHOID VACCINE PO CPDR: 1.0000 | DELAYED_RELEASE_CAPSULE | ORAL | 0 refills | Status: DC

## 2016-10-19 NOTE — Progress Notes (Signed)
Patient ID: Nicole Wallace, female    DOB: May 29, 1980, 35 y.o.   MRN: [id number]  PCP: Harrison Mons, PA-C  Chief Complaint  Patient presents with  . Annual Exam    Headache, PAP, Insomnia    Subjective:   Presents for [facility] [employer] Visit.  Cervical Cancer Screening: 10/2013, normal. LGSIL 2011, normal since then. Breast Cancer Screening: Performs monthly SBE, irregular CBE. Not yet a candidate for screening mammography. Colorectal Cancer Screening: not yet a candidate Bone Density Testing: not yet a candidate HIV Screening: negative 03/2015 STI Screening: desires today, due to recent partner had other partners. Seasonal Influenza Vaccination: current, annual Td/Tdap Vaccination: 10/2012 Pneumococcal Vaccination: not yet a candidate Zoster Vaccination: not yet a candidate Frequency of Dental evaluation: Q6 months Frequency of Eye evaluation: infrequently, plans to go this year  Headache intermittently x 2 weeks. Sharp pains behind eyes, shooting into the ears. One morning, her neck was stiff. Had and "extra" period, starting 1 week after the completion of her previous period. With the increased stress, she had an HSV outbreak. Trazodone helps, but she tries not to take it.  Break-up with boyfriend of 4 years. Their relationship was "unhealthy." He told her she's not good enough to marry, due to her suicidal episode about 2 years ago. Also told her "I can forgive you, but I can't forget." Yet he expects forgiveness for his transgressions, including infidelity. She contacted her psychiatrist with increasing depressive symptoms, including suicidal ideations, and Depakote was increased. She is tolerating the higher dose well, and feels better. No thoughts of harming herself or others.    Patient Active Problem List   Diagnosis Date Noted  . Umbilical hernia 40/98/1191  . MDD (major depressive disorder), recurrent episode, severe ([place]) 02/12/2015  . Migraine  04/13/2012  . Fibroids 04/13/2012  . Anxiety 04/13/2012    Past Medical History:  Diagnosis Date  . Allergy   . Anxiety   . Depression   . Duodenitis    mild  . Fibroid uterus   . 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 Heart murmur   . LSIL (low grade squamous intraepithelial lesion) on Pap smear    colposcopy 03/2010; normal paps after  . Migraine      Prior to Admission medications   Medication Sig Start Date End Date Taking? Authorizing Provider  citalopram (CELEXA) 40 MG tablet TAKE ONE TABLET BY MOUTH ONCE DAILY FOR  DEPRESSION 08/17/16  Yes Nevin Grizzle, PA-C  divalproex (DEPAKOTE) 250 MG DR tablet Take 1 tablet (250 mg total) by mouth 2 (two) times daily. 10/10/16  Yes Arfeen, Arlyce Harman, MD  hydrOXYzine (ATARAX/VISTARIL) 10 MG tablet Take 1 tablet (10 mg total) by mouth at bedtime. 06/28/16  Yes Arfeen, Arlyce Harman, MD  traMADol (ULTRAM) 50 MG tablet Take 1 tablet (50 mg) every 12 hours: For pain 02/17/15  Yes Lindell Spar I, NP  traZODone (DESYREL) 50 MG tablet Take 1 tablet (50 mg total) by mouth at bedtime. 06/28/16  Yes Arfeen, Arlyce Harman, MD  valACYclovir (VALTREX) 1000 MG tablet Take 1 tablet (1,000 mg total) by mouth 2 (two) times daily. For viral infection 02/17/15  Yes Encarnacion Slates, NP    Allergies  Allergen Reactions  . Adhesive [Tape] Other (See Comments)    Causes red marks  . Wool Alcohol [Lanolin] Rash    Past Surgical History:  Procedure Laterality Date  . colonic polyp removed   05/14/2007   non-malignant  . INTRAUTERINE DEVICE INSERTION  06/21/2013  Mirena    Family History  Problem Relation Age of Onset  . Asthma Mother   . Diabetes Mother   . Hyperlipidemia Mother   . Hypertension Father   . Post-traumatic stress disorder Father   . Hypertension Sister        cardiomegaly  . Hyperlipidemia Sister   . Asthma Sister   . Scoliosis Sister   . Fibromyalgia Sister   . Heart disease Sister 63       "mild heart attack"  . Depression Sister   . Arthritis Maternal  Grandmother   . Heart disease Maternal Grandmother   . Kidney disease Maternal Grandmother   . Hyperlipidemia Maternal Grandmother   . Heart disease Maternal Grandfather   . Hypertension Maternal Grandfather   . Hyperlipidemia Maternal Grandfather   . Cancer Paternal Grandmother        lung  . Aneurysm Paternal Grandfather   . Asthma Maternal Aunt   . Diabetes Maternal Aunt   . Heart disease Maternal Aunt   . Schizophrenia Cousin     Social History   Social History  . Marital status: Married    Spouse name: estranged  . Number of children: 0  . Years of education: 15   Occupational History  . Site Supervisor     [employer]   Social History Main Topics  . Smoking status: Never Smoker  . Smokeless tobacco: Never Used  . Alcohol use No  . Drug use: No     Comment: Patient denies   . Sexual activity: Yes    Partners: Male    Birth control/ protection: IUD     Comment: Mirena 06/21/2013-1st intercourse 36 yo-More than 5 partners   Other Topics Concern  . None   Social History Narrative   Separated from husband since 06/2010 (he lives in [place], [place]).     She lives alone.    Her father lives very nearby in [place].   Her mother lives in [place].   Her sister lives in [place].    Review of Systems  Constitutional: Positive for appetite change and fatigue. Negative for activity change, chills, diaphoresis, fever and unexpected weight change.  HENT: Positive for ear pain. Negative for congestion, dental problem, drooling, ear discharge, facial swelling, hearing loss, mouth sores, nosebleeds, postnasal drip, rhinorrhea, sinus pain, sinus pressure, sneezing, sore throat, tinnitus, trouble swallowing and voice change.   Eyes: Negative.   Respiratory: Negative.   Cardiovascular: Negative.   Gastrointestinal: Negative.   Endocrine: Negative.   Genitourinary: Negative.   Musculoskeletal: Negative.   Allergic/Immunologic: Negative.   Neurological: Positive for headaches.  Negative for dizziness, tremors, seizures, syncope, facial asymmetry, speech difficulty, light-headedness and numbness.  Hematological: Negative.   Psychiatric/Behavioral: Negative.         Objective:  Physical Exam  Constitutional: She is oriented to person, place, and time. Vital signs are normal. She appears well-developed and well-nourished. She is active and cooperative. No distress.  BP 104/70   Pulse 60   Temp 98.6 F (37 C) (Oral)   Resp 16   Ht 5\' 4"  (1.626 m)   Wt 147 lb (66.7 kg)   SpO2 100%   BMI 25.23 kg/m    HENT:  Head: Normocephalic and atraumatic.  Right Ear: Hearing, tympanic membrane, external ear and ear canal normal. No foreign bodies.  Left Ear: Hearing, tympanic membrane, external ear and ear canal normal. No foreign bodies.  Nose: Nose normal.  Mouth/Throat: Uvula is midline, oropharynx is clear and  moist and mucous membranes are normal. No oral lesions. Normal dentition. No dental abscesses or uvula swelling. No oropharyngeal exudate.  Eyes: Conjunctivae, EOM and lids are normal. Pupils are equal, round, and reactive to light. Right eye exhibits no discharge. Left eye exhibits no discharge. No scleral icterus.  Fundoscopic exam:      The right eye shows no arteriolar narrowing, no AV nicking, no exudate, no hemorrhage and no papilledema.       The left eye shows no arteriolar narrowing, no AV nicking, no exudate, no hemorrhage and no papilledema.  Neck: Trachea normal, normal range of motion and full passive range of motion without pain. Neck supple. No spinous process tenderness and no muscular tenderness present. No thyroid mass and no thyromegaly present.  Cardiovascular: Normal rate, regular rhythm, normal heart sounds, intact distal pulses and normal pulses.   Pulmonary/Chest: Effort normal and breath sounds normal. She exhibits no tenderness and no retraction. Right breast exhibits no inverted nipple, no mass, no nipple discharge, no skin change and no  tenderness. Left breast exhibits no inverted nipple, no mass, no nipple discharge, no skin change and no tenderness. Breasts are symmetrical.  Abdominal: Soft. Normal appearance and bowel sounds are normal. She exhibits no distension and no mass. There is no hepatosplenomegaly. There is no tenderness. There is no rigidity, no rebound, no guarding, no CVA tenderness, no tenderness at McBurney's point and negative Murphy's sign. A hernia (umbilical) is present. Hernia confirmed negative in the right inguinal area and confirmed negative in the left inguinal area.  Genitourinary: Rectum normal, vagina normal and uterus normal. Rectal exam shows no external hemorrhoid and no fissure. No breast swelling, tenderness, discharge or bleeding. Pelvic exam was performed with patient supine. No labial fusion. There is no rash, tenderness, lesion or injury on the right labia. There is no rash, tenderness, lesion or injury on the left labia. Cervix exhibits no motion tenderness, no discharge and no friability. Right adnexum displays no mass, no tenderness and no fullness. Left adnexum displays no mass, no tenderness and no fullness. No erythema, tenderness or bleeding in the vagina. No foreign body in the vagina. No signs of injury around the vagina. No vaginal discharge found.  Musculoskeletal: She exhibits no edema or tenderness.       Cervical back: Normal.       Thoracic back: Normal.       Lumbar back: Normal.  Lymphadenopathy:       Head (right side): No tonsillar, no preauricular, no posterior auricular and no occipital adenopathy present.       Head (left side): No tonsillar, no preauricular, no posterior auricular and no occipital adenopathy present.    She has no cervical adenopathy.    She has no axillary adenopathy.       Right: No inguinal and no supraclavicular adenopathy present.       Left: No inguinal and no supraclavicular adenopathy present.  Neurological: She is alert and oriented to person,  place, and time. She has normal strength and normal reflexes. No cranial nerve deficit. She exhibits normal muscle tone. Coordination and gait normal.  Skin: Skin is warm, dry and intact. No rash noted. She is not diaphoretic. No cyanosis or erythema. Nails show no clubbing.  Psychiatric: She has a normal mood and affect. Her speech is normal and behavior is normal. Judgment and thought content normal.           Assessment & Plan:   Problem List Items Addressed  This Visit    Migraine (Chronic)    Likely triggered by lack of sleep associated wit recent increased stress. Use trazodone as needed. Rest. Hydrate. If persists, re-evaluate.      MDD (major depressive disorder), recurrent episode, severe ([place])    Continue current treatment, counseling and follow-up with psychiatry.      Umbilical hernia   HSV infection   Relevant Medications   typhoid (VIVOTIF) DR capsule   valACYclovir (VALTREX) 1000 MG tablet    Other Visit Diagnoses    Annual physical exam    -  Primary   Routine screening for STI (sexually transmitted infection)       Relevant Orders   HIV antibody (Completed)   RPR (Completed)   Constipation, unspecified constipation type       Screening for deficiency anemia       Relevant Orders   CBC with Differential/Platelet (Completed)   Screening for hyperlipidemia       Relevant Orders   Lipid panel (Completed)   Screening for thyroid disorder       Relevant Orders   T4, free (Completed)   TSH (Completed)   Screening for metabolic disorder       Relevant Orders   Comprehensive metabolic panel (Completed)   Screening for blood or protein in urine       Relevant Orders   POCT urinalysis dipstick (Completed)   Screening for cervical cancer       Relevant Orders   Pap IG, CT/NG NAA, and HPV (high risk) (Completed)   Counseling about travel       Relevant Medications   typhoid (VIVOTIF) DR capsule   Need for hepatitis A vaccination       Relevant Orders    Hepatitis A vaccine adult IM (Completed)   Care order/instruction: (Completed)       Return in about 6 months (around 04/20/2017), or if symptoms worsen or fail to improve, for Hep A vaccine dose #2.   Fara Chute, PA-C [facility]

## 2016-10-19 NOTE — Assessment & Plan Note (Signed)
Likely triggered by lack of sleep associated wit recent increased stress. Use trazodone as needed. Rest. Hydrate. If persists, re-evaluate.

## 2016-10-19 NOTE — Patient Instructions (Addendum)
1. Take the trazodone. Get the sleep you need. If your headaches still don't resolve, let me know. 2. Schedule your eye exam.    IF you received an x-ray today, you will receive an invoice from [facility]. Please contact [facility] at [phone] with questions or concerns regarding your invoice.   IF you received labwork today, you will receive an invoice from [place]. Please contact [facility] at [phone] with questions or concerns regarding your invoice.   Our billing staff will not be able to assist you with questions regarding bills from these companies.  You will be contacted with the lab results as soon as they are available. The fastest way to get your results is to activate your My Chart account. Instructions are located on the last page of this paperwork. If you have not heard from [place] regarding the results in 2 weeks, please contact this office.    Keeping You Healthy  Get These Tests 1. Blood Pressure- Have your blood pressure checked once a year by your health care provider.  Normal blood pressure is 120/80. 2. Weight- Have your body mass index (BMI) calculated to screen for obesity.  BMI is measure of body fat based on height and weight.  You can also calculate your own BMI at GravelBags.it. 3. Cholesterol- Have your cholesterol checked every 5 years starting at age 74 then yearly starting at age 35. 45. Chlamydia, HIV, and other sexually transmitted diseases- Get screened every year until age 89, then within three months of each new sexual provider. 5. Pap Test - Every 1-5 years; discuss with your health care provider. 6. Mammogram- Every 1-2 years starting at age 52--50  Take these medicines  Calcium with Vitamin D-Your body needs 1200 mg of Calcium each day and [phone] IU of Vitamin D daily.  Your body can only absorb 500 mg of Calcium at a time so Calcium must be taken in 2 or 3 divided doses throughout the day.  Multivitamin with folic  acid- Once daily if it is possible for you to become pregnant.  Get these Immunizations  Gardasil-Series of three doses; prevents HPV related illness such as genital warts and cervical cancer.  Menactra-Single dose; prevents meningitis.  Tetanus shot- Every 10 years.  Flu shot-Every year.  Take these steps 1. Do not smoke-Your healthcare provider can help you quit.  For tips on how to quit go to www.smokefree.gov or call 1-800 QUITNOW. 2. Be physically active- Exercise 5 days a week for at least 30 minutes.  If you are not already physically active, start slow and gradually work up to 30 minutes of moderate physical activity.  Examples of moderate activity include walking briskly, dancing, swimming, bicycling, etc. 3. Breast Cancer- A self breast exam every month is important for early detection of breast cancer.  For more information and instruction on self breast exams, ask your healthcare provider or https://www.patel.info/. 4. Eat a healthy diet- Eat a variety of healthy foods such as fruits, vegetables, whole grains, low fat milk, low fat cheeses, yogurt, lean meats, poultry and fish, beans, nuts, tofu, etc.  For more information go to www. Thenutritionsource.org 5. Drink alcohol in moderation- Limit alcohol intake to one drink or less per day. Never drink and drive. 6. Depression- Your emotional health is as important as your physical health.  If you're feeling down or losing interest in things you normally enjoy please talk to your healthcare provider about being screened for depression. 7. Dental visit- Brush and floss your  teeth twice daily; visit your dentist twice a year. 8. Eye doctor- Get an eye exam at least every 2 years. 9. Helmet use- Always wear a helmet when riding a bicycle, motorcycle, rollerblading or skateboarding. 15. Safe sex- If you may be exposed to sexually transmitted infections, use a condom. 11. Seat belts- Seat belts can save your live;  always wear one. 12. Smoke/Carbon Monoxide detectors- These detectors need to be installed on the appropriate level of your home. Replace batteries at least once a year. 13. Skin cancer- When out in the sun please cover up and use sunscreen 15 SPF or higher. 14. Violence- If anyone is threatening or hurting you, please tell your healthcare provider.

## 2016-10-20 LAB — CBC WITH DIFFERENTIAL/PLATELET
BASOS: 1 %
Basophils Absolute: 0 10*3/uL (ref 0.0–0.2)
EOS (ABSOLUTE): 0.2 10*3/uL (ref 0.0–0.4)
EOS: 6 %
HEMATOCRIT: 41.3 % (ref 34.0–46.6)
HEMOGLOBIN: 13.9 g/dL (ref 11.1–15.9)
IMMATURE GRANS (ABS): 0 10*3/uL (ref 0.0–0.1)
Immature Granulocytes: 0 %
LYMPHS: 50 %
Lymphocytes Absolute: 1.6 10*3/uL (ref 0.7–3.1)
MCH: 34.1 pg — AB (ref 26.6–33.0)
MCHC: 33.7 g/dL (ref 31.5–35.7)
MCV: 101 fL — ABNORMAL HIGH (ref 79–97)
MONOCYTES: 7 %
Monocytes Absolute: 0.2 10*3/uL (ref 0.1–0.9)
NEUTROS ABS: 1.2 10*3/uL — AB (ref 1.4–7.0)
Neutrophils: 36 %
Platelets: 243 10*3/uL (ref 150–379)
RBC: 4.08 x10E6/uL (ref 3.77–5.28)
RDW: 12.4 % (ref 12.3–15.4)
WBC: 3.2 10*3/uL — ABNORMAL LOW (ref 3.4–10.8)

## 2016-10-20 LAB — COMPREHENSIVE METABOLIC PANEL
ALBUMIN: 4.8 g/dL (ref 3.5–5.5)
ALT: 16 IU/L (ref 0–32)
AST: 19 IU/L (ref 0–40)
Albumin/Globulin Ratio: 2.3 — ABNORMAL HIGH (ref 1.2–2.2)
Alkaline Phosphatase: 60 IU/L (ref 39–117)
BILIRUBIN TOTAL: 0.5 mg/dL (ref 0.0–1.2)
BUN / CREAT RATIO: 8 — AB (ref 9–23)
BUN: 7 mg/dL (ref 6–20)
CALCIUM: 9.8 mg/dL (ref 8.7–10.2)
CHLORIDE: 104 mmol/L (ref 96–106)
CO2: 26 mmol/L (ref 18–29)
Creatinine, Ser: 0.9 mg/dL (ref 0.57–1.00)
GFR, EST AFRICAN AMERICAN: 94 mL/min/{1.73_m2} (ref >59)
GFR, EST NON AFRICAN AMERICAN: 82 mL/min/{1.73_m2} (ref >59)
Globulin, Total: 2.1 g/dL (ref 1.5–4.5)
Glucose: 85 mg/dL (ref 65–99)
POTASSIUM: 4.4 mmol/L (ref 3.5–5.2)
Sodium: 142 mmol/L (ref 134–144)
TOTAL PROTEIN: 6.9 g/dL (ref 6.0–8.5)

## 2016-10-20 LAB — T4, FREE: FREE T4: 1.24 ng/dL (ref 0.82–1.77)

## 2016-10-20 LAB — LIPID PANEL
CHOL/HDL RATIO: 2.7 ratio (ref 0.0–4.4)
Cholesterol, Total: 154 mg/dL (ref 100–199)
HDL: 57 mg/dL (ref >39)
LDL Calculated: 87 mg/dL (ref 0–99)
Triglycerides: 52 mg/dL (ref 0–149)
VLDL Cholesterol Cal: 10 mg/dL (ref 5–40)

## 2016-10-20 LAB — TSH: TSH: 2.68 u[IU]/mL (ref 0.450–4.500)

## 2016-10-20 LAB — RPR: RPR: NONREACTIVE

## 2016-10-20 LAB — HIV ANTIBODY (ROUTINE TESTING W REFLEX): HIV Screen 4th Generation wRfx: NONREACTIVE

## 2016-10-24 LAB — PAP IG, CT-NG NAA, HPV HIGH-RISK
CHLAMYDIA, NUC. ACID AMP: NEGATIVE
Gonococcus by Nucleic Acid Amp: NEGATIVE
HPV, high-risk: NEGATIVE
PAP Smear Comment: 0

## 2016-10-28 ENCOUNTER — Telehealth: Payer: Self-pay | Admitting: Physician Assistant

## 2016-10-28 ENCOUNTER — Encounter: Payer: Self-pay | Admitting: Physician Assistant

## 2016-10-28 NOTE — Telephone Encounter (Signed)
Pt is still having headaches and is needing something called in for pain that she will be able to use at work   [employer] number [phone]

## 2016-10-29 NOTE — Telephone Encounter (Signed)
Unfortunately, she really needs to be evaluated in person for this issue. If the headaches are getting worse, she may need to take a day off work. If she has been taking tramadol, ibuprofen, and/or acetaminophen on a daily basis, that may be causing the headaches now. Stopping these medications can help, though she can expect a worse headache initially.  If the headache gets so bad that she cannot work, or if she develops any mental status changes, dizziness, nausea, weakness, facial droop, vision changes, slurred speech, then she should go immediately to the emergency department.  The next Saturday that I work is 7/28.

## 2016-10-29 NOTE — Telephone Encounter (Signed)
Pt states that she will only be able to come in on a Saturday due to work and she will only see you.   Adv pt you will not be here the next 2 Saturdays, she wanted me to let you know that a weekday appt would not work for her.

## 2016-10-29 NOTE — Telephone Encounter (Signed)
Called and left message on voice mail to return call.

## 2016-10-29 NOTE — Telephone Encounter (Signed)
Please advise this patient that she should schedule a visit with me to discuss her headaches.

## 2016-10-29 NOTE — Telephone Encounter (Signed)
Will not see another provider. Please advise

## 2016-10-30 NOTE — Telephone Encounter (Signed)
Called and spoke with patient, read Nicole Wallace's message, she will call back to set an appointment for 7/28. She is unable to take any day off during the week.

## 2016-11-07 ENCOUNTER — Ambulatory Visit: Payer: BLUE CROSS/BLUE SHIELD | Admitting: Physician Assistant

## 2016-11-13 NOTE — Assessment & Plan Note (Signed)
Continue current treatment, counseling and follow-up with psychiatry.

## 2016-11-21 ENCOUNTER — Other Ambulatory Visit (HOSPITAL_COMMUNITY): Payer: Self-pay

## 2016-11-21 DIAGNOSIS — F331 Major depressive disorder, recurrent, moderate: Secondary | ICD-10-CM

## 2016-11-21 MED ORDER — DIVALPROEX SODIUM 250 MG PO DR TAB: 250.0000 mg | DELAYED_RELEASE_TABLET | Freq: Two times a day (BID) | ORAL | 0 refills | Status: DC

## 2016-12-23 ENCOUNTER — Ambulatory Visit (HOSPITAL_COMMUNITY): Payer: Self-pay | Admitting: Psychiatry

## 2017-03-04 ENCOUNTER — Telehealth: Payer: Self-pay | Admitting: Family Medicine

## 2017-03-05 ENCOUNTER — Other Ambulatory Visit: Payer: Self-pay

## 2017-03-05 DIAGNOSIS — F331 Major depressive disorder, recurrent, moderate: Secondary | ICD-10-CM

## 2017-03-05 MED ORDER — DIVALPROEX SODIUM 250 MG PO DR TAB: 250.0000 mg | DELAYED_RELEASE_TABLET | Freq: Two times a day (BID) | ORAL | 0 refills | Status: DC

## 2017-03-05 NOTE — Telephone Encounter (Signed)
Refill sent.

## 2017-03-05 NOTE — Telephone Encounter (Signed)
Pt needs a office visit for further refills as documented in pt's chart. Last seen in June 2018.

## 2017-03-05 NOTE — Telephone Encounter (Signed)
This medication is prescribed by her psychiatrist, Dr. Adele Schilder. Is there a problem getting a refill from him? If so, please document and OK to send in a 30-day supply. Suspect that she needs to schedule follow-up with Dr. Adele Schilder.

## 2017-05-20 ENCOUNTER — Ambulatory Visit (INDEPENDENT_AMBULATORY_CARE_PROVIDER_SITE_OTHER): Payer: Self-pay | Admitting: Licensed Clinical Social Worker

## 2017-05-20 ENCOUNTER — Encounter (HOSPITAL_COMMUNITY): Payer: Self-pay | Admitting: Licensed Clinical Social Worker

## 2017-05-20 DIAGNOSIS — F3162 Bipolar disorder, current episode mixed, moderate: Secondary | ICD-10-CM
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
## 2017-06-05 ENCOUNTER — Ambulatory Visit (INDEPENDENT_AMBULATORY_CARE_PROVIDER_SITE_OTHER): Payer: Self-pay | Admitting: Licensed Clinical Social Worker

## 2017-06-05 ENCOUNTER — Encounter (HOSPITAL_COMMUNITY): Payer: Self-pay | Admitting: Licensed Clinical Social Worker

## 2017-06-05 DIAGNOSIS — F3162 Bipolar disorder, current episode mixed, moderate: Secondary | ICD-10-CM

## 2017-06-05 NOTE — Progress Notes (Signed)
   THERAPIST PROGRESS NOTE  Session Time: 9:10-10:00am  Participation Level: Active  Behavioral Response: CasualAlertEuthymic  Type of Therapy: Individual Therapy  Treatment Goals addressed: Depressive symptoms, bipolar symptoms  Interventions: CBT and Motivational Interviewing  Summary: Jacquilyn Seldon is a 37 y.o. female who presents for individual counseling session. Today is the first therapy session so spent a considerable amount of time building a trusting therapeutic relationship. Pt discussed her psychiatric symptoms and current life events. Pt reports she stopped taking her psychiatric meds. Educated pt on the importance of taking her medications, especially with a bipolar diagnosis. Pt meets with her psychiatrist Dr. Adele Schilder Monday and suggested she discuss her meds with him. Pt discussed relationships: x boyfriend, x husband and sister. Pt has weak emotional boundaries and struggles to create clear and healthy boundaries. Worked with pt on relationship circles.  Suicidal/Homicidal: Nowithout intent/plan  Therapist Response: Assessed pt's current functioning and reviewed progress. Assisted pt building a trusting therapeutic relationship, psychiatric meds, relationships, boundaries. Assisted pt processing for the management of her stressors.  Plan: Return again in 2 weeks.  Diagnosis: Axis I: Bipolar 1 disorder      Jancy Sprankle S, LCAS 06/05/2017

## 2017-06-09 ENCOUNTER — Ambulatory Visit (HOSPITAL_COMMUNITY): Payer: Self-pay | Admitting: Psychiatry

## 2017-06-12 ENCOUNTER — Ambulatory Visit (HOSPITAL_COMMUNITY): Payer: Self-pay | Admitting: Licensed Clinical Social Worker

## 2017-06-19 ENCOUNTER — Ambulatory Visit (INDEPENDENT_AMBULATORY_CARE_PROVIDER_SITE_OTHER): Payer: Self-pay | Admitting: Licensed Clinical Social Worker

## 2017-06-19 ENCOUNTER — Encounter (HOSPITAL_COMMUNITY): Payer: Self-pay | Admitting: Licensed Clinical Social Worker

## 2017-06-19 DIAGNOSIS — F3162 Bipolar disorder, current episode mixed, moderate: Secondary | ICD-10-CM

## 2017-06-19 NOTE — Progress Notes (Signed)
   THERAPIST PROGRESS NOTE  Session Time: 9:10-10:00am  Participation Level: Active  Behavioral Response: CasualAlertEuthymic  Type of Therapy: Individual Therapy  Treatment Goals addressed: Depressive symptoms, bipolar symptoms  Interventions: CBT and Motivational Interviewing  Summary: Oaklynn Stierwalt is a 37 y.o. female who presents for individual counseling session.  Pt discussed her psychiatric symptoms and current life events. Pt missed her appt with this clinician and Dr. Adele Schilder last week. Pt reports she was busy at work last week and unable to come back to [place].  Discussed with pt  the importance of taking medications, especially with a bipolar diagnosis.Worked with pt on indecisive vs concrete. Pt is working on being more of a Manufacturing systems engineer. Being indecisive in her past relationships has been problematic. Pt desires to use boundaries in her relationships. Taught pt how to use boundaries and practiced in session. Worked with pt on the importance of self care.      Suicidal/Homicidal: Nowithout intent/plan  Therapist Response: Assessed pt's current functioning and reviewed progress. Assisted pt processing psychiatric meds, relationships, boundaries, concrete thinking.. Assisted pt processing for the management of her stressors.  Plan: Return again in 2 weeks.  Diagnosis: Axis I: Bipolar 1 disorder      MACKENZIE,LISBETH S, LCAS 06/19/2017

## 2017-06-26 ENCOUNTER — Ambulatory Visit (HOSPITAL_COMMUNITY): Payer: Self-pay | Admitting: Licensed Clinical Social Worker

## 2017-07-04 ENCOUNTER — Ambulatory Visit (INDEPENDENT_AMBULATORY_CARE_PROVIDER_SITE_OTHER): Payer: BLUE CROSS/BLUE SHIELD | Admitting: Physician Assistant

## 2017-07-04 ENCOUNTER — Encounter: Payer: Self-pay | Admitting: Physician Assistant

## 2017-07-04 ENCOUNTER — Other Ambulatory Visit: Payer: Self-pay

## 2017-07-04 VITALS — BP 114/66 | HR 93 | Temp 99.1°F | Resp 18 | Ht 64.0 in | Wt 158.2 lb

## 2017-07-04 DIAGNOSIS — F332 Major depressive disorder, recurrent severe without psychotic features: Secondary | ICD-10-CM

## 2017-07-04 DIAGNOSIS — F419 Anxiety disorder, unspecified: Secondary | ICD-10-CM

## 2017-07-04 DIAGNOSIS — B009 Herpesviral infection, unspecified: Secondary | ICD-10-CM

## 2017-07-04 DIAGNOSIS — G43009 Migraine without aura, not intractable, without status migrainosus: Secondary | ICD-10-CM | POA: Diagnosis not present

## 2017-07-04 DIAGNOSIS — Z113 Encounter for screening for infections with a predominantly sexual mode of transmission: Secondary | ICD-10-CM

## 2017-07-04 MED ORDER — TRAMADOL HCL 50 MG PO TABS: ORAL_TABLET | ORAL | 0 refills | Status: DC

## 2017-07-04 NOTE — Assessment & Plan Note (Signed)
Advised that she has refills on the prescription written 10/19/2016.

## 2017-07-04 NOTE — Assessment & Plan Note (Signed)
Patient feels well controlled. Recommend scheduling follow-up with psychiatry. Advised of the importance to resume treatment BEFORE she finds herself in crisis. Continue talk therapy.

## 2017-07-04 NOTE — Patient Instructions (Addendum)
Call to schedule follow-up with Dr. Adele Schilder. 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    IF you received an x-ray today, you will receive an invoice from [facility]. Please contact [facility] at [phone] with questions or concerns regarding your invoice.   IF you received labwork today, you will receive an invoice from [facility]. Please contact [facility] at [phone] with questions or concerns regarding your invoice.   Our billing staff will not be able to assist you with questions regarding bills from these companies.  You will be contacted with the lab results as soon as they are available. The fastest way to get your results is to activate your My Chart account. Instructions are located on the last page of this paperwork. If you have not heard from [place] regarding the results in 2 weeks, please contact this office.

## 2017-07-04 NOTE — Progress Notes (Signed)
Patient ID: Nicole Wallace, female    DOB: 1980-08-23, 37 y.o.   MRN: [id number]  PCP: Harrison Mons, PA-C  Chief Complaint  Patient presents with  . STD Check    Pt states she isn't experiencing any symptoms and hasn't had sex in a while but would like to be checked.  . Medication Refill    Tramadol 50 MG and Valtrex 1000 MG    Subjective:   Presents for STI screening and evaluation of headaches, HSV infection and mood.  Single sexual partner, but no sex in a while. He was tested recently, and she desire recheck as well. Known HSV. Takes valacyclovir daily for suppressive therapy. Outbreaks are associated with stress. Last year outbreaks were more frequent, and increased dose to BID. Has resumed QD dosing.  Has stopped citalopram and Depakote. Dr. Adele Schilder not aware. She missed her last appointment. Her work schedule is more difficult now, and job is more stressful. Has been promoted to Freight forwarder. Her therapist is concerned that she will have a manic episode, but the patient feels good-attending church, "unhooked" from false beliefs/unrealistic expectations of herself that were making her unhappy, feels really stable.  Occasional headaches. Requests refill of tramadol. Not needing trazodone to sleep.   Review of Systems As above. No CP, SOB, dizziness. No GI/GU symptoms. No vaginal itching, discharge, pain. No muscle or joint pain. No rash. No fever, chills.  Depression screen [facility] 2/9 07/04/2017 07/04/2017 10/19/2016 06/14/2015 05/09/2015  Decreased Interest 0 0 0 0 0  Down, Depressed, Hopeless 0 0 0 0 0  PHQ - 2 Score 0 0 0 0 0  Altered sleeping 0 - - - -  Tired, decreased energy 1 - - - -  Change in appetite 0 - - - -  Feeling bad or failure about yourself  0 - - - -  Trouble concentrating 0 - - - -  Moving slowly or fidgety/restless 0 - - - -  Suicidal thoughts 0 - - - -  PHQ-9 Score 1 - - - -  Difficult doing work/chores Not difficult at all - - - -     Patient  Active Problem List   Diagnosis Date Noted  . 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 Umbilical hernia 00/86/7619  . MDD (major depressive disorder), recurrent episode, severe ([place]) 02/12/2015  . Migraine 04/13/2012  . Fibroids 04/13/2012  . Anxiety 04/13/2012     Prior to Admission medications   Medication Sig Start Date End Date Taking? Authorizing Provider  hydrOXYzine (ATARAX/VISTARIL) 10 MG tablet Take 1 tablet (10 mg total) by mouth at bedtime. 06/28/16  Yes Arfeen, Arlyce Harman, MD  traMADol (ULTRAM) 50 MG tablet Take 1 tablet (50 mg) every 12 hours: For pain 02/17/15  Yes Lindell Spar I, NP  traZODone (DESYREL) 50 MG tablet Take 1 tablet (50 mg total) by mouth at bedtime. 06/28/16  Yes Arfeen, Arlyce Harman, MD  typhoid (VIVOTIF) DR capsule Take 1 capsule by mouth every other day. 10/19/16  Yes Zionah Criswell, PA-C  valACYclovir (VALTREX) 1000 MG tablet Take 1 tablet (1,000 mg total) by mouth 2 (two) times daily. For viral infection 10/19/16  Yes Kenny Rea, PA-C  citalopram (CELEXA) 40 MG tablet TAKE ONE TABLET BY MOUTH ONCE DAILY FOR  DEPRESSION Patient not taking: Reported on 07/04/2017 08/17/16   Harrison Mons, PA-C  divalproex (DEPAKOTE) 250 MG DR tablet Take 1 tablet (250 mg total) by mouth 2 (two) times daily. Patient not taking: Reported on 07/04/2017 03/05/17  Harrison Mons, PA-C     Allergies  Allergen Reactions  . Adhesive [Tape] Other (See Comments)    Causes red marks  . Other     Cats  . Wool Alcohol [Lanolin] Rash       Objective:  Physical Exam  Constitutional: She is oriented to person, place, and time. She appears well-developed and well-nourished. She is active and cooperative. No distress.  BP 114/66 (BP Location: Left Arm, Patient Position: Sitting, Cuff Size: Normal)   Pulse 93   Temp 99.1 F (37.3 C) (Oral)   Resp 18   Ht 5\' 4"  (1.626 m)   Wt 158 lb 3.2 oz (71.8 kg)   SpO2 99%   BMI 27.15 kg/m   HENT:  Head: Normocephalic and atraumatic.  Right Ear: Hearing  normal.  Left Ear: Hearing normal.  Eyes: Conjunctivae are normal. No scleral icterus.  Neck: Normal range of motion. Neck supple. No thyromegaly present.  Cardiovascular: Normal rate, regular rhythm and normal heart sounds.  Pulses:      Radial pulses are 2+ on the right side, and 2+ on the left side.  Pulmonary/Chest: Effort normal and breath sounds normal.  Lymphadenopathy:       Head (right side): No tonsillar, no preauricular, no posterior auricular and no occipital adenopathy present.       Head (left side): No tonsillar, no preauricular, no posterior auricular and no occipital adenopathy present.    She has no cervical adenopathy.       Right: No supraclavicular adenopathy present.       Left: No supraclavicular adenopathy present.  Neurological: She is alert and oriented to person, place, and time. No sensory deficit.  Skin: Skin is warm, dry and intact. No rash noted. No cyanosis or erythema. Nails show no clubbing.  Psychiatric: She has a normal mood and affect. Her speech is normal and behavior is normal.       Assessment & Plan:   Problem List Items Addressed This Visit    Migraine - Primary (Chronic)    Infrequent. OK to continue PRN tramadol, but if frequency increases, will need to reassess.      Relevant Medications   traMADol (ULTRAM) 50 MG tablet   Anxiety (Chronic)    Patient feels well controlled. Recommend scheduling follow-up with psychiatry. Advised of the importance to resume treatment BEFORE she finds herself in crisis. Continue talk therapy.      MDD (major depressive disorder), recurrent episode, severe ([place])    Patient feels well controlled. Recommend scheduling follow-up with psychiatry. Advised of the importance to resume treatment BEFORE she finds herself in crisis. Continue talk therapy.      HSV infection    Advised that she has refills on the prescription written 10/19/2016.       Other Visit Diagnoses    Routine screening for STI (sexually  transmitted infection)       Relevant Orders   GC/Chlamydia Probe Amp   HIV antibody   WET PREP FOR TRICH, YEAST, CLUE   RPR   Hepatitis C antibody       Return in about 6 months (around 01/01/2018) for migraine, etc.   Fara Chute, PA-C [facility] [place]

## 2017-07-04 NOTE — Assessment & Plan Note (Signed)
Infrequent. OK to continue PRN tramadol, but if frequency increases, will need to reassess.

## 2017-07-05 LAB — WET PREP FOR TRICH, YEAST, CLUE
CLUE CELL EXAM: NEGATIVE
Trichomonas Exam: NEGATIVE
Yeast Exam: NEGATIVE

## 2017-07-05 LAB — HEPATITIS C ANTIBODY: Hep C Virus Ab: <0.1 s/co ratio (ref 0.0–0.9)

## 2017-07-05 LAB — GC/CHLAMYDIA PROBE AMP
Chlamydia trachomatis, NAA: NEGATIVE
NEISSERIA GONORRHOEAE BY PCR: NEGATIVE

## 2017-07-05 LAB — HIV ANTIBODY (ROUTINE TESTING W REFLEX): HIV SCREEN 4TH GENERATION: NONREACTIVE

## 2017-07-05 LAB — RPR: RPR Ser Ql: NONREACTIVE

## 2017-07-09 ENCOUNTER — Ambulatory Visit (HOSPITAL_COMMUNITY): Payer: Self-pay | Admitting: Licensed Clinical Social Worker

## 2017-07-15 ENCOUNTER — Ambulatory Visit (INDEPENDENT_AMBULATORY_CARE_PROVIDER_SITE_OTHER): Payer: Self-pay | Admitting: Licensed Clinical Social Worker

## 2017-07-15 ENCOUNTER — Encounter (HOSPITAL_COMMUNITY): Payer: Self-pay | Admitting: Licensed Clinical Social Worker

## 2017-07-15 DIAGNOSIS — F3162 Bipolar disorder, current episode mixed, moderate: Secondary | ICD-10-CM

## 2017-07-15 NOTE — Progress Notes (Signed)
   THERAPIST PROGRESS NOTE  Session Time: 9:10-10:00am  Participation Level: Active  Behavioral Response: CasualAlertEuthymic  Type of Therapy: Individual Therapy  Treatment Goals addressed: Depressive symptoms, bipolar symptoms  Interventions: CBT and Motivational Interviewing  Summary: Nicole Wallace is a 37 y.o. female who presents for individual counseling session.  Pt discussed her psychiatric symptoms and current life events. Pt missed her appt with this clinician, discussed with her the importance of commitment to appts. Pt desires to continue in therapy and will work harder on meeting appts. 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 Processed this at length with pt, alternative skills for managing people. Pt reports she is ready to walk away from the job and is looking for something closer to home. She has applied to [employer] has a low frustration tolerance, instructed pt on distress tolerance skills. Suggest to use these skills in her personal and professional life. 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 Validated pt on her desire for self care.     Suicidal/Homicidal: Nowithout intent/plan  Therapist Response: Assessed pt's current functioning and reviewed progress. Assisted pt processing low frustration tolerance, distress tolerance skills, managing people in the workplace, and self care. Assisted pt processing for the management of her stressors.  Plan: Return again in 2 weeks.  Diagnosis: Axis I: Bipolar 1 disorder      Malcomb Gangemi S, LCAS 07/15/2017

## 2017-07-24 ENCOUNTER — Ambulatory Visit (HOSPITAL_COMMUNITY): Payer: Self-pay | Admitting: Licensed Clinical Social Worker

## 2017-07-29 ENCOUNTER — Encounter (HOSPITAL_COMMUNITY): Payer: Self-pay | Admitting: Licensed Clinical Social Worker

## 2017-07-29 ENCOUNTER — Ambulatory Visit (INDEPENDENT_AMBULATORY_CARE_PROVIDER_SITE_OTHER): Payer: Self-pay | Admitting: Licensed Clinical Social Worker

## 2017-07-29 DIAGNOSIS — F319 Bipolar disorder, unspecified: Secondary | ICD-10-CM

## 2017-07-29 DIAGNOSIS — Z566 Other physical and mental strain related to work: Secondary | ICD-10-CM

## 2017-07-29 DIAGNOSIS — F331 Major depressive disorder, recurrent, moderate: Secondary | ICD-10-CM

## 2017-07-29 NOTE — Progress Notes (Signed)
   THERAPIST PROGRESS NOTE  Session Time: 9:10-10:00am  Participation Level: Active  Behavioral Response: CasualAlertEuthymic  Type of Therapy: Individual Therapy  Treatment Goals addressed: Depressive symptoms, bipolar symptoms  Interventions: CBT and Motivational Interviewing  Summary: Nicole Wallace is a 37 y.o. female who presents for individual counseling session.  Pt discussed her psychiatric symptoms and current life events. Pt made appt  With Dr. Adele Schilder for medication management as she has started taking her celexa and Depakote again. Discussed her current stressors. 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 Asked open ended questions. Pt is still looking for another job. Pt reports she uses meditation for stress at work. Showed pt some meditations on You Tube she can use at work with headphones. Pt reports she also uses yoga and running as stress relief as well. Asked open ended questions using empathic reflection. Inquired of pt where does she see her life in the next year and what is she willing to do to accomplish her goals. Pt reflected on  What she wants in her life.       Suicidal/Homicidal: Nowithout intent/plan  Therapist Response: Assessed pt's current functioning and reviewed progress. Assisted pt processing  managing people in the workplace, and self care, future goals and strategies. Assisted pt processing for the management of her stressors.  Plan: Return again in 2 weeks.  Diagnosis: Axis I: Bipolar 1 disorder      MACKENZIE,LISBETH S, LCAS 07/29/2017

## 2017-08-07 ENCOUNTER — Ambulatory Visit (HOSPITAL_COMMUNITY): Payer: Self-pay | Admitting: Licensed Clinical Social Worker

## 2017-08-14 ENCOUNTER — Ambulatory Visit (HOSPITAL_COMMUNITY): Payer: Self-pay | Admitting: Licensed Clinical Social Worker

## 2017-08-14 ENCOUNTER — Encounter: Payer: Self-pay | Admitting: Physician Assistant

## 2017-08-15 ENCOUNTER — Encounter (HOSPITAL_COMMUNITY): Payer: Self-pay | Admitting: Psychiatry

## 2017-08-15 ENCOUNTER — Ambulatory Visit (INDEPENDENT_AMBULATORY_CARE_PROVIDER_SITE_OTHER): Payer: Self-pay | Admitting: Psychiatry

## 2017-08-15 VITALS — BP 126/76 | HR 65 | Ht 65.0 in | Wt 158.0 lb

## 2017-08-15 DIAGNOSIS — F1011 Alcohol abuse, in remission: Secondary | ICD-10-CM

## 2017-08-15 DIAGNOSIS — Z975 Presence of (intrauterine) contraceptive device: Secondary | ICD-10-CM

## 2017-08-15 DIAGNOSIS — Z818 Family history of other mental and behavioral disorders: Secondary | ICD-10-CM

## 2017-08-15 DIAGNOSIS — Z566 Other physical and mental strain related to work: Secondary | ICD-10-CM

## 2017-08-15 DIAGNOSIS — F331 Major depressive disorder, recurrent, moderate: Secondary | ICD-10-CM

## 2017-08-15 MED ORDER — CITALOPRAM HYDROBROMIDE 40 MG PO TABS: ORAL_TABLET | ORAL | 0 refills | Status: DC

## 2017-08-15 MED ORDER — HYDROXYZINE HCL 10 MG PO TABS: 10.0000 mg | ORAL_TABLET | Freq: Every day | ORAL | 0 refills | Status: DC

## 2017-08-15 MED ORDER — TRAZODONE HCL 50 MG PO TABS: 50.0000 mg | ORAL_TABLET | Freq: Every day | ORAL | 0 refills | Status: DC

## 2017-08-15 MED ORDER — DIVALPROEX SODIUM 250 MG PO DR TAB: 250.0000 mg | DELAYED_RELEASE_TABLET | Freq: Every day | ORAL | 0 refills | Status: DC
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
## 2017-08-18 ENCOUNTER — Ambulatory Visit (HOSPITAL_COMMUNITY): Payer: Self-pay | Admitting: Licensed Clinical Social Worker

## 2017-08-27 ENCOUNTER — Ambulatory Visit (HOSPITAL_COMMUNITY): Payer: Self-pay | Admitting: Licensed Clinical Social Worker

## 2017-09-01 ENCOUNTER — Ambulatory Visit (HOSPITAL_COMMUNITY): Payer: Self-pay | Admitting: Licensed Clinical Social Worker

## 2017-09-09 ENCOUNTER — Ambulatory Visit (HOSPITAL_COMMUNITY): Payer: Self-pay | Admitting: Licensed Clinical Social Worker

## 2017-09-10 ENCOUNTER — Ambulatory Visit (HOSPITAL_COMMUNITY): Payer: Self-pay | Admitting: Licensed Clinical Social Worker

## 2017-09-15 ENCOUNTER — Ambulatory Visit (HOSPITAL_COMMUNITY): Payer: Self-pay | Admitting: Licensed Clinical Social Worker

## 2017-09-22 ENCOUNTER — Ambulatory Visit (HOSPITAL_COMMUNITY): Payer: Self-pay | Admitting: Licensed Clinical Social Worker

## 2017-09-22 ENCOUNTER — Encounter

## 2017-09-29 ENCOUNTER — Ambulatory Visit (HOSPITAL_COMMUNITY): Payer: Self-pay | Admitting: Licensed Clinical Social Worker

## 2017-10-06 ENCOUNTER — Ambulatory Visit (HOSPITAL_COMMUNITY): Payer: Self-pay | Admitting: Licensed Clinical Social Worker

## 2017-11-18 ENCOUNTER — Ambulatory Visit (HOSPITAL_COMMUNITY): Payer: Self-pay | Admitting: Psychiatry

## 2018-07-27 ENCOUNTER — Telehealth: Payer: Self-pay | Admitting: *Deleted

## 2018-07-27 NOTE — Telephone Encounter (Signed)
Some studies have shown the Mirena good up to 7 years but that is not what the manufacturer's recommend.  Nicole Wallace which is essentially the same as Mirena is indicated for 6 years.  Mirena only guarantees protection for 5 years.  So bottom line is I think it is okay to wait till May recognizing its going past what the official Mirena policy is.

## 2018-07-27 NOTE — Telephone Encounter (Signed)
Patient informed. 

## 2018-07-27 NOTE — Telephone Encounter (Signed)
Patient has New Established patient appointment schedule next week, has Mirena IUD placed in feb 2015, currently she is paying out of pocket for health appointments, will have insurance in May. She would like to have Mirena IUD removed/ new one inserted is questioning how effective IUD will be waiting until May? Please advise

## 2018-07-28 ENCOUNTER — Ambulatory Visit (INDEPENDENT_AMBULATORY_CARE_PROVIDER_SITE_OTHER): Payer: Self-pay | Admitting: Psychiatry

## 2018-07-28 ENCOUNTER — Other Ambulatory Visit: Payer: Self-pay

## 2018-07-28 ENCOUNTER — Encounter

## 2018-07-28 VITALS — BP 126/78 | HR 56 | Temp 98.8°F | Ht 64.5 in | Wt 160.0 lb

## 2018-07-28 DIAGNOSIS — F3181 Bipolar II disorder: Secondary | ICD-10-CM

## 2018-07-28 DIAGNOSIS — F331 Major depressive disorder, recurrent, moderate: Secondary | ICD-10-CM

## 2018-07-28 DIAGNOSIS — F4310 Post-traumatic stress disorder, unspecified: Secondary | ICD-10-CM

## 2018-07-28 MED ORDER — DIVALPROEX SODIUM 250 MG PO DR TAB: 250.0000 mg | DELAYED_RELEASE_TABLET | Freq: Every day | ORAL | 0 refills | Status: DC

## 2018-07-28 MED ORDER — CITALOPRAM HYDROBROMIDE 40 MG PO TABS: ORAL_TABLET | ORAL | 0 refills | Status: DC
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
## 2018-08-04 ENCOUNTER — Ambulatory Visit: Payer: BLUE CROSS/BLUE SHIELD | Admitting: Gynecology

## 2018-09-15 ENCOUNTER — Telehealth (HOSPITAL_COMMUNITY): Payer: Self-pay

## 2018-09-15 DIAGNOSIS — F331 Major depressive disorder, recurrent, moderate: Secondary | ICD-10-CM

## 2018-09-15 NOTE — Telephone Encounter (Signed)
Yes, she can go back to trazodone 50 mg 1/2 to one one tab at bed time. Please call the medication to her current pharmacy.

## 2018-09-15 NOTE — Telephone Encounter (Signed)
Patient is calling because she has been unable to sleep, she would like to know if she can go back on the Trazodone, please review and advise.

## 2018-09-18 MED ORDER — TRAZODONE HCL 50 MG PO TABS: 50.0000 mg | ORAL_TABLET | Freq: Every day | ORAL | 2 refills | Status: DC

## 2018-09-18 NOTE — Telephone Encounter (Signed)
I called patient to let her know, she wants you to know she has been having really bad migraines and would like to know if you think it could be from any of her medication. Please review and advise, thank you

## 2018-09-18 NOTE — Telephone Encounter (Signed)
She needs to go urgent care or see Neurologist.

## 2018-09-29 ENCOUNTER — Ambulatory Visit: Payer: BLUE CROSS/BLUE SHIELD | Admitting: Gynecology

## 2018-10-12 HISTORY — PX: INTRAUTERINE DEVICE INSERTION: SHX323

## 2018-10-13 ENCOUNTER — Ambulatory Visit (INDEPENDENT_AMBULATORY_CARE_PROVIDER_SITE_OTHER): Payer: 59 | Admitting: Gynecology

## 2018-10-13 ENCOUNTER — Other Ambulatory Visit: Payer: Self-pay

## 2018-10-13 ENCOUNTER — Encounter: Payer: Self-pay | Admitting: Gynecology

## 2018-10-13 VITALS — BP 110/70 | Ht 65.0 in | Wt 167.0 lb

## 2018-10-13 DIAGNOSIS — Z113 Encounter for screening for infections with a predominantly sexual mode of transmission: Secondary | ICD-10-CM

## 2018-10-13 DIAGNOSIS — R87610 Atypical squamous cells of undetermined significance on cytologic smear of cervix (ASC-US): Secondary | ICD-10-CM | POA: Diagnosis not present

## 2018-10-13 DIAGNOSIS — Z01419 Encounter for gynecological examination (general) (routine) without abnormal findings: Secondary | ICD-10-CM | POA: Diagnosis not present

## 2018-10-13 DIAGNOSIS — A609 Anogenital herpesviral infection, unspecified: Secondary | ICD-10-CM

## 2018-10-13 DIAGNOSIS — Z13220 Encounter for screening for lipoid disorders: Secondary | ICD-10-CM

## 2018-10-13 MED ORDER — VALACYCLOVIR HCL 500 MG PO TABS: 500.0000 mg | ORAL_TABLET | Freq: Every day | ORAL | 12 refills | Status: AC

## 2018-10-13 NOTE — Addendum Note (Signed)
Addended by: Nelva Nay on: 10/13/2018 11:30 AM   Modules accepted: Orders

## 2018-10-13 NOTE — Patient Instructions (Signed)
Follow-up to have the Mirena IUD replaced

## 2018-10-13 NOTE — Progress Notes (Signed)
    Nicole Wallace Dec 10, 1980 [id number]        38 y.o.  G0P0000 for annual gynecologic exam.  Has not been in the office for over 3 years.  Without gynecologic complaints.  Past medical history,surgical history, problem list, medications, allergies, family history and social history were all reviewed and documented as reviewed in the EPIC chart.  ROS:  Performed with pertinent positives and negatives included in the history, assessment and plan.   Additional significant findings : None   Exam: Caryn Bee assistant Vitals:   10/13/18 1026  BP: 110/70  Weight: 167 lb (75.8 kg)  Height: 5\' 5"  (1.651 m)   Body mass index is 27.79 kg/m.  General appearance:  Normal affect, orientation and appearance. Skin: Grossly normal HEENT: Without gross lesions.  No cervical or supraclavicular adenopathy. Thyroid normal.  Lungs:  Clear without wheezing, rales or rhonchi Cardiac: RR, without RMG Abdominal:  Soft, nontender, without masses, guarding, rebound, organomegaly or hernia Breasts:  Examined lying and sitting without masses, retractions, discharge or axillary adenopathy. Pelvic:  Ext, BUS, Vagina: Normal  Cervix: Normal.  Pap smear done.  IUD string not visualized  Uterus: Axial to anteverted generous in size, midline and mobile nontender   Adnexa: Without masses or tenderness    Anus and perineum: Normal   Rectovaginal: Normal sphincter tone without palpated masses or tenderness.    Assessment/Plan:  38 y.o. G0P0000 female for annual gynecologic exam.  Menses, Mirena IUD  1. Mirena IUD due to be replaced now.  IUD string not visualized but previously had been documented to be in proper location by ultrasound.  She will make an appointment to have her IUD replaced. 2. History of leiomyoma.  Uterus generous in size as previously without evidence of enlargement from her prior exam 2016. 3. Pap smear 2018 showed ASCUS negative high risk HPV done elsewhere.  Repeat Pap smear done  today.  History of LGSIL 2011 with normal Pap smears until her 2018 ASCUS. 4. Breast exam normal.  Plan screening mammogram next year at age 84.  SBE monthly reviewed 5. History of HSV.  Not taking Valtrex or any other medication.  Has occasional outbreaks.  Discussed options to include expectant, intermittent treatment or daily suppression.  Patient thinking about daily suppression.  Valtrex 500 mg #30, 1 p.o. daily with 12 refills provided. 6. STD screening discussed.  Patient prefers to be screened.  GC/chlamydia done. 7. Health maintenance.  Requests screening lab work.  CBC, CMP and lipid profile ordered.  Follow-up for IUD replacement.  Follow-up in 1 year for annual exam.   Anastasio Auerbach MD, 10:51 AM 10/13/2018

## 2018-10-14 LAB — COMPREHENSIVE METABOLIC PANEL
AG Ratio: 1.9 (calc) (ref 1.0–2.5)
ALT: 15 U/L (ref 6–29)
AST: 19 U/L (ref 10–30)
Albumin: 4.3 g/dL (ref 3.6–5.1)
Alkaline phosphatase (APISO): 43 U/L (ref 31–125)
BUN/Creatinine Ratio: 7 (calc) (ref 6–22)
BUN: 6 mg/dL — ABNORMAL LOW (ref 7–25)
CO2: 25 mmol/L (ref 20–32)
Calcium: 9.6 mg/dL (ref 8.6–10.2)
Chloride: 105 mmol/L (ref 98–110)
Creat: 0.87 mg/dL (ref 0.50–1.10)
Globulin: 2.3 g/dL (calc) (ref 1.9–3.7)
Glucose, Bld: 87 mg/dL (ref 65–99)
Potassium: 4.3 mmol/L (ref 3.5–5.3)
Sodium: 136 mmol/L (ref 135–146)
Total Bilirubin: 0.6 mg/dL (ref 0.2–1.2)
Total Protein: 6.6 g/dL (ref 6.1–8.1)

## 2018-10-14 LAB — LIPID PANEL
Cholesterol: 173 mg/dL (ref <200)
HDL: 58 mg/dL (ref >=50)
LDL Cholesterol (Calc): 100 mg/dL (calc) — ABNORMAL HIGH
Non-HDL Cholesterol (Calc): 115 mg/dL (calc) (ref <130)
Total CHOL/HDL Ratio: 3 (calc) (ref <5.0)
Triglycerides: 65 mg/dL (ref <150)

## 2018-10-14 LAB — PAP IG W/ RFLX HPV ASCU

## 2018-10-14 LAB — CBC WITH DIFFERENTIAL/PLATELET
Absolute Monocytes: 231 cells/uL (ref 200–950)
Basophils Absolute: 42 cells/uL (ref 0–200)
Basophils Relative: 1.2 %
Eosinophils Absolute: 102 cells/uL (ref 15–500)
Eosinophils Relative: 2.9 %
HCT: 37.9 % (ref 35.0–45.0)
Hemoglobin: 13 g/dL (ref 11.7–15.5)
Lymphs Abs: 1719 cells/uL (ref 850–3900)
MCH: 34.4 pg — ABNORMAL HIGH (ref 27.0–33.0)
MCHC: 34.3 g/dL (ref 32.0–36.0)
MCV: 100.3 fL — ABNORMAL HIGH (ref 80.0–100.0)
MPV: 11.3 fL (ref 7.5–12.5)
Monocytes Relative: 6.6 %
Neutro Abs: 1407 cells/uL — ABNORMAL LOW (ref 1500–7800)
Neutrophils Relative %: 40.2 %
Platelets: 217 10*3/uL (ref 140–400)
RBC: 3.78 10*6/uL — ABNORMAL LOW (ref 3.80–5.10)
RDW: 11.8 % (ref 11.0–15.0)
Total Lymphocyte: 49.1 %
WBC: 3.5 10*3/uL — ABNORMAL LOW (ref 3.8–10.8)

## 2018-10-15 LAB — C. TRACHOMATIS/N. GONORRHOEAE RNA
C. trachomatis RNA, TMA: NOT DETECTED
N. gonorrhoeae RNA, TMA: NOT DETECTED

## 2018-10-21 ENCOUNTER — Other Ambulatory Visit: Payer: Self-pay

## 2018-10-22 ENCOUNTER — Ambulatory Visit (INDEPENDENT_AMBULATORY_CARE_PROVIDER_SITE_OTHER): Payer: 59 | Admitting: Gynecology

## 2018-10-22 ENCOUNTER — Encounter: Payer: Self-pay | Admitting: Gynecology

## 2018-10-22 ENCOUNTER — Other Ambulatory Visit: Payer: Self-pay

## 2018-10-22 VITALS — BP 114/70

## 2018-10-22 DIAGNOSIS — Z30433 Encounter for removal and reinsertion of intrauterine contraceptive device: Secondary | ICD-10-CM

## 2018-10-22 MED ORDER — METRONIDAZOLE 500 MG PO TABS: 500.0000 mg | ORAL_TABLET | Freq: Two times a day (BID) | ORAL | 0 refills | Status: DC

## 2018-10-22 NOTE — Addendum Note (Signed)
Addended by: Anastasio Auerbach on: 10/22/2018 03:30 PM   Modules accepted: Orders

## 2018-10-22 NOTE — Progress Notes (Addendum)
    Nicole Wallace May 10, 1981 [id number]        38 y.o.  G0P0000  presents for Mirena IUD replacement. She has read through the booklet, has no contraindications and signed the consent form.  I reviewed the removal and insertional process with her as well as the risks to include infection, either immediate or long-term, uterine perforation or migration requiring surgery to remove, other complications such as pain, hormonal side effects and possibility of failure with subsequent pregnancy.   Exam with Caryn Bee assistant Vitals:   10/22/18 1457  BP: 114/70    Pelvic: External BUS vagina normal. Cervix normal IUD string not visualized. Uterus generous in size midline mobile nontender. Adnexa without masses or tenderness.  Procedure: The cervix was visualized with a speculum and the IUD strings were not visualized and initial attempts to enter the external os with forceps to remove the IUD were unsuccessful due to stenosis.  The cervix was cleansed with Betadine and a paracervical block was placed using 1% lidocaine, 10 cc total.  A single-tooth tenaculum was applied to the anterior lip of the cervix and the cervix was dilated to admit forceps which subsequently grasped the IUD strings and her old Mirena IUD was removed without difficulty, shown to the patient and discarded.  The uterus was sounded and a new Mirena IUD was placed according to manufacturer's recommendations without difficulty. The strings were trimmed. The patient tolerated well and will follow up in one month for a postinsertional check. Patient also noted over the past several years and intermittent slight discharge with odor.  It comes and goes.  Currently not having this.  We discussed that it sounds like a low level bacterial vaginosis that comes and goes.  I am going to cover her with metronidazole 500 mg twice daily x7 days.  Alcohol avoidance discussed.  Lot number:  [id number]    Anastasio Auerbach MD, 3:22 PM 10/22/2018

## 2018-10-22 NOTE — Patient Instructions (Signed)
Follow-up in 1 to 2 months for IUD check.

## 2018-10-23 ENCOUNTER — Encounter: Payer: Self-pay | Admitting: Gynecology

## 2018-10-23 ENCOUNTER — Ambulatory Visit (INDEPENDENT_AMBULATORY_CARE_PROVIDER_SITE_OTHER): Payer: 59 | Admitting: Gynecology

## 2018-10-23 VITALS — BP 118/76

## 2018-10-23 DIAGNOSIS — Z30431 Encounter for routine checking of intrauterine contraceptive device: Secondary | ICD-10-CM

## 2018-10-23 NOTE — Progress Notes (Signed)
    Nicole Wallace [birth year] [id number]        38 y.o.  G0P0000 presents having had Mirena IUD placed yesterday.  Had intercourse last night and her partner felt the strings.  Past medical history,surgical history, problem list, medications, allergies, family history and social history were all reviewed and documented in the EPIC chart.  Directed ROS with pertinent positives and negatives documented in the history of present illness/assessment and plan.  Exam: Caryn Bee assistant Vitals:   10/23/18 1237  BP: 118/76   General appearance:  Normal Pelvic external BUS vagina normal.  Cervix normal.  IUD strings trimmed to approximately 1 cm from the external os  Assessment/Plan:  38 y.o. G0P0000 for follow-up of IUD placement yesterday.  IUD strings were trimmed.  She will follow-up in a month or 2 to see how she is doing.  Sooner if partner continues to be able to feel the strings.    Anastasio Auerbach MD, 12:44 PM 10/23/2018

## 2018-10-23 NOTE — Patient Instructions (Signed)
Follow-up if IUD strings continue to be an issue.

## 2018-10-28 ENCOUNTER — Ambulatory Visit (INDEPENDENT_AMBULATORY_CARE_PROVIDER_SITE_OTHER): Payer: Self-pay | Admitting: Psychiatry

## 2018-10-28 ENCOUNTER — Encounter (HOSPITAL_COMMUNITY): Payer: Self-pay | Admitting: Psychiatry

## 2018-10-28 ENCOUNTER — Other Ambulatory Visit: Payer: Self-pay

## 2018-10-28 DIAGNOSIS — F3181 Bipolar II disorder: Secondary | ICD-10-CM

## 2018-10-28 DIAGNOSIS — F4310 Post-traumatic stress disorder, unspecified: Secondary | ICD-10-CM

## 2018-10-28 DIAGNOSIS — F331 Major depressive disorder, recurrent, moderate: Secondary | ICD-10-CM

## 2018-10-28 MED ORDER — DIVALPROEX SODIUM 500 MG PO DR TAB: 500.0000 mg | DELAYED_RELEASE_TABLET | Freq: Every day | ORAL | 0 refills | Status: DC

## 2018-10-28 MED ORDER — TRAZODONE HCL 50 MG PO TABS: 50.0000 mg | ORAL_TABLET | Freq: Every evening | ORAL | 1 refills | Status: DC | PRN

## 2018-10-28 MED ORDER — CITALOPRAM HYDROBROMIDE 40 MG PO TABS: ORAL_TABLET | ORAL | 0 refills | Status: DC
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
## 2019-01-28 ENCOUNTER — Ambulatory Visit (INDEPENDENT_AMBULATORY_CARE_PROVIDER_SITE_OTHER): Payer: 59 | Admitting: Psychiatry

## 2019-01-28 ENCOUNTER — Encounter (HOSPITAL_COMMUNITY): Payer: Self-pay | Admitting: Psychiatry

## 2019-01-28 ENCOUNTER — Other Ambulatory Visit: Payer: Self-pay

## 2019-01-28 DIAGNOSIS — F331 Major depressive disorder, recurrent, moderate: Secondary | ICD-10-CM | POA: Diagnosis not present

## 2019-01-28 DIAGNOSIS — F4310 Post-traumatic stress disorder, unspecified: Secondary | ICD-10-CM | POA: Diagnosis not present

## 2019-01-28 DIAGNOSIS — F3181 Bipolar II disorder: Secondary | ICD-10-CM

## 2019-01-28 MED ORDER — TRAZODONE HCL 50 MG PO TABS: 50.0000 mg | ORAL_TABLET | Freq: Every evening | ORAL | 0 refills | Status: DC | PRN

## 2019-01-28 MED ORDER — DIVALPROEX SODIUM 500 MG PO DR TAB: 500.0000 mg | DELAYED_RELEASE_TABLET | Freq: Every day | ORAL | 0 refills | Status: DC

## 2019-01-28 MED ORDER — CITALOPRAM HYDROBROMIDE 40 MG PO TABS: ORAL_TABLET | ORAL | 0 refills | Status: DC
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
## 2019-02-02 ENCOUNTER — Encounter: Payer: Self-pay | Admitting: Gynecology

## 2019-04-29 ENCOUNTER — Ambulatory Visit (INDEPENDENT_AMBULATORY_CARE_PROVIDER_SITE_OTHER): Payer: 59 | Admitting: Psychiatry

## 2019-04-29 ENCOUNTER — Other Ambulatory Visit: Payer: Self-pay

## 2019-04-29 ENCOUNTER — Encounter (HOSPITAL_COMMUNITY): Payer: Self-pay | Admitting: Psychiatry

## 2019-04-29 DIAGNOSIS — F4310 Post-traumatic stress disorder, unspecified: Secondary | ICD-10-CM

## 2019-04-29 DIAGNOSIS — F3181 Bipolar II disorder: Secondary | ICD-10-CM | POA: Diagnosis not present

## 2019-04-29 MED ORDER — TRAZODONE HCL 50 MG PO TABS: 50.0000 mg | ORAL_TABLET | Freq: Every evening | ORAL | 0 refills | Status: DC | PRN

## 2019-04-29 MED ORDER — DIVALPROEX SODIUM 500 MG PO DR TAB: 500.0000 mg | DELAYED_RELEASE_TABLET | Freq: Every day | ORAL | 0 refills | Status: DC

## 2019-04-29 MED ORDER — CITALOPRAM HYDROBROMIDE 40 MG PO TABS: ORAL_TABLET | ORAL | 0 refills | Status: DC
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
## 2019-07-15 ENCOUNTER — Ambulatory Visit (INDEPENDENT_AMBULATORY_CARE_PROVIDER_SITE_OTHER): Payer: 59 | Admitting: Psychiatry

## 2019-07-15 ENCOUNTER — Other Ambulatory Visit: Payer: Self-pay

## 2019-07-15 ENCOUNTER — Encounter (HOSPITAL_COMMUNITY): Payer: Self-pay | Admitting: Psychiatry

## 2019-07-15 DIAGNOSIS — Z62810 Personal history of physical and sexual abuse in childhood: Secondary | ICD-10-CM | POA: Diagnosis not present

## 2019-07-15 DIAGNOSIS — F4310 Post-traumatic stress disorder, unspecified: Secondary | ICD-10-CM

## 2019-07-15 DIAGNOSIS — F3181 Bipolar II disorder: Secondary | ICD-10-CM

## 2019-07-15 DIAGNOSIS — Z915 Personal history of self-harm: Secondary | ICD-10-CM

## 2019-07-15 MED ORDER — TRAZODONE HCL 50 MG PO TABS: 50.0000 mg | ORAL_TABLET | Freq: Every evening | ORAL | 0 refills | Status: DC | PRN

## 2019-07-15 MED ORDER — DIVALPROEX SODIUM 500 MG PO DR TAB: 500.0000 mg | DELAYED_RELEASE_TABLET | Freq: Every day | ORAL | 0 refills | Status: DC

## 2019-07-15 MED ORDER — CITALOPRAM HYDROBROMIDE 40 MG PO TABS: ORAL_TABLET | ORAL | 0 refills | Status: DC
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
## 2019-10-14 ENCOUNTER — Other Ambulatory Visit: Payer: Self-pay

## 2019-10-14 ENCOUNTER — Telehealth (INDEPENDENT_AMBULATORY_CARE_PROVIDER_SITE_OTHER): Payer: 59 | Admitting: Psychiatry

## 2019-10-14 ENCOUNTER — Encounter (HOSPITAL_COMMUNITY): Payer: Self-pay | Admitting: Psychiatry

## 2019-10-14 DIAGNOSIS — F3181 Bipolar II disorder: Secondary | ICD-10-CM | POA: Diagnosis not present

## 2019-10-14 DIAGNOSIS — F4310 Post-traumatic stress disorder, unspecified: Secondary | ICD-10-CM

## 2019-10-14 MED ORDER — DIVALPROEX SODIUM 500 MG PO DR TAB: 500.0000 mg | DELAYED_RELEASE_TABLET | Freq: Every day | ORAL | 0 refills | Status: DC

## 2019-10-14 MED ORDER — CITALOPRAM HYDROBROMIDE 40 MG PO TABS: ORAL_TABLET | ORAL | 0 refills | Status: DC
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
## 2019-10-19 ENCOUNTER — Telehealth (HOSPITAL_COMMUNITY): Payer: Self-pay | Admitting: *Deleted

## 2019-10-19 NOTE — Telephone Encounter (Signed)
Pt called with c/o increased panic attacks which are affecting her job. Pt is asking for a work note as well as medication to use when panic attack comes on. Pt has an upcoming appointment on 01/14/20. Please review and advise.

## 2019-10-19 NOTE — Telephone Encounter (Signed)
I recommended 10 mg only which should not be sedated. If still not like to try than she can take Buspar 5 mg daily as needed.

## 2019-10-19 NOTE — Telephone Encounter (Signed)
So she prefers the Vistaril 10 mg. What's the sig on that? Bid prn for panic attacks?

## 2019-10-19 NOTE — Telephone Encounter (Signed)
She can try hydroxyzine 10 mg to take as needed for severe panic attack.  We can provide note to have FMLA 8 to 16 hours a month for flareups.

## 2019-10-19 NOTE — Telephone Encounter (Signed)
She says Vistaril knocks her out.

## 2020-01-11 ENCOUNTER — Encounter (HOSPITAL_COMMUNITY): Payer: Self-pay | Admitting: Psychiatry

## 2020-01-11 ENCOUNTER — Other Ambulatory Visit: Payer: Self-pay

## 2020-01-11 ENCOUNTER — Telehealth (INDEPENDENT_AMBULATORY_CARE_PROVIDER_SITE_OTHER): Payer: 59 | Admitting: Psychiatry

## 2020-01-11 DIAGNOSIS — F4310 Post-traumatic stress disorder, unspecified: Secondary | ICD-10-CM | POA: Diagnosis not present

## 2020-01-11 DIAGNOSIS — F3181 Bipolar II disorder: Secondary | ICD-10-CM

## 2020-01-11 MED ORDER — DIVALPROEX SODIUM 500 MG PO DR TAB: 500.0000 mg | DELAYED_RELEASE_TABLET | Freq: Every day | ORAL | 0 refills | Status: DC

## 2020-01-11 MED ORDER — TRAZODONE HCL 50 MG PO TABS: 50.0000 mg | ORAL_TABLET | Freq: Every evening | ORAL | 0 refills | Status: DC | PRN

## 2020-01-11 MED ORDER — CITALOPRAM HYDROBROMIDE 40 MG PO TABS: ORAL_TABLET | ORAL | 0 refills | Status: DC
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
## 2020-01-14 ENCOUNTER — Telehealth (HOSPITAL_COMMUNITY): Payer: 59 | Admitting: Psychiatry

## 2020-02-04 DIAGNOSIS — Z0289 Encounter for other administrative examinations: Secondary | ICD-10-CM

## 2020-02-08 ENCOUNTER — Telehealth (INDEPENDENT_AMBULATORY_CARE_PROVIDER_SITE_OTHER): Payer: 59 | Admitting: Psychiatry

## 2020-02-08 ENCOUNTER — Encounter (HOSPITAL_COMMUNITY): Payer: Self-pay | Admitting: Psychiatry

## 2020-02-08 ENCOUNTER — Other Ambulatory Visit: Payer: Self-pay

## 2020-02-08 VITALS — Wt 164.0 lb

## 2020-02-08 DIAGNOSIS — F4310 Post-traumatic stress disorder, unspecified: Secondary | ICD-10-CM | POA: Diagnosis not present

## 2020-02-08 DIAGNOSIS — F419 Anxiety disorder, unspecified: Secondary | ICD-10-CM | POA: Diagnosis not present

## 2020-02-08 MED ORDER — HYDROXYZINE PAMOATE 25 MG PO CAPS: ORAL_CAPSULE | ORAL | 0 refills | Status: DC
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
## 2020-02-15 ENCOUNTER — Other Ambulatory Visit: Payer: Self-pay

## 2020-02-15 ENCOUNTER — Ambulatory Visit (INDEPENDENT_AMBULATORY_CARE_PROVIDER_SITE_OTHER): Payer: 59 | Admitting: Clinical

## 2020-02-15 DIAGNOSIS — F4310 Post-traumatic stress disorder, unspecified: Secondary | ICD-10-CM | POA: Diagnosis not present

## 2020-02-15 DIAGNOSIS — F419 Anxiety disorder, unspecified: Secondary | ICD-10-CM
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
## 2020-02-24 ENCOUNTER — Ambulatory Visit (INDEPENDENT_AMBULATORY_CARE_PROVIDER_SITE_OTHER): Payer: 59 | Admitting: Clinical

## 2020-02-24 DIAGNOSIS — F419 Anxiety disorder, unspecified: Secondary | ICD-10-CM

## 2020-02-24 DIAGNOSIS — F4310 Post-traumatic stress disorder, unspecified: Secondary | ICD-10-CM | POA: Diagnosis not present

## 2020-02-24 NOTE — Progress Notes (Signed)
   THERAPIST PROGRESS NOTE  Session Time: 8am  Participation Level: Active  Behavioral Response: CasualAlertEuthymic  Type of Therapy: Individual Therapy   Location-Provider [place] Patient Home  I connected with Nicole Wallace , on 02/24/20 at 8:00am  by video enabled telemedicine application and verified that I am speaking with the correct person using two identifiers. I discussed the limitations, risks, security and privacy concerns of performing an evaluation and management service by video and the availability of in person appointments. I also discussed with the patient that there may be a patient responsible charge related to this service. The patient expressed understanding and agreed to proceed.  I discussed the assessment and treatment plan with the patient. The patient was provided an opportunity to ask questions and all were answered. The patient agreed with the plan and demonstrated an understanding of the instructions.  The patient was advised to call back or seek an in-person evaluation if the symptoms worsen or if the condition fails to improve as anticipated.   I provided 43min of non-face-to-face time during this encounter.  Patient:  home Provider: [facility] [place] [facility]  Treatment Goals addressed: Pt participation in completion of treatment team plan. Pt identified treatment goals she would like to work toward achieving. [place], completed on 02/24/2020 Meet with clinician virtually on a weekly to biweekly basis for therapy sessions to assess progress towards personal goals and receive assistance/feedback as needed to address barriers to success Meet with PCP/psychiatrist regularly to address efficacy of medications and adjust regimen/dose as needed.  Take medications daily as prescribed to aid in symptom management and improvement in daily functioning. Maintain and implement healthy boundaries by avoiding accepting responsibility for other's decisions  and displaying assertive communication when interacting with others.  Develop and implement stress management techniques as evidenced by engaging in positive stress relieving activities, identify common stressors faced on regular basis and impact it can have on daily functioning.   Interventions: Strength-based and Supportive  Summary: Nicole Wallace is a 39 y.o. female who presents with a history of anxiety and post traumatic stress disorder. Pt participated in completion of person centered plan. Pt reports she is doing well and presents in a euthymic mood. Pt reports she has been practicing regular self care by exercising daily and maintaining healthy boundaries with others. Pt discussed having a strained relationship with her sister and shared a recent incident where she was able to avoid taking responsibility for her sister's behavior.    Suicidal/Homicidal: Pt denies a plan to harm herself or others. Pt encouraged to call 911 or go to nearest emergency department in the event of an emergency. Therapist Response: CSW verbally praised pt for her continued effort to improve her wellbeing. CSW assessed for changes in pt daily functioning, mood and behavior. CSW provided supportive listening as pt discussed the strained relationship with her sister. CSW verbally praised pt for maintaining healthy boundary with sibling. CSW prompted pt to identify triggers. Pt identified "disrespect(people not respecting boundaries), people bringing up my past and meaningless verbal exchanges." CSW actively listened as pt shared past experience of how she previously responded to triggers versus now. CSW reviewed treatment plan with pt and she reports being agreeable to established goals.  Plan: Return again in 1 week.  Diagnosis: Axis I: Post traumatic stress disorder    Anxiety   Axis II: No diagnosis    Yvette Rack, LCSW 02/24/2020

## 2020-03-02 ENCOUNTER — Ambulatory Visit (HOSPITAL_COMMUNITY): Payer: 59 | Admitting: Clinical

## 2020-03-02 ENCOUNTER — Other Ambulatory Visit: Payer: Self-pay

## 2020-03-02 DIAGNOSIS — F4310 Post-traumatic stress disorder, unspecified: Secondary | ICD-10-CM

## 2020-03-02 DIAGNOSIS — F419 Anxiety disorder, unspecified: Secondary | ICD-10-CM

## 2020-03-02 NOTE — Progress Notes (Signed)
   THERAPIST PROGRESS NOTE  Session Time: 8am  Participation Level: Active  Behavioral Response: CasualAlertEuthymic  Type of Therapy: Individual Therapy  Treatment Goals addressed: Anxiety  Interventions: Strength-based and Supportive   I connected withJanelle , on10/21/21at8:00am by video enabled telemedicineapplicationand verified that I am speaking with the correct person using two identifiers. I discussed the limitations, risks, security and privacy concerns of performing an evaluation and management service by video and the availability of in person appointments. I also discussed with the patient that there may be a patient responsible charge related to this service. The patient expressed understanding and agreed to proceed. I discussed the assessment and treatment plan with the patient. The patient was provided an opportunity to ask questions and all were answered. The patient agreed with the plan and demonstrated an understanding of the instructions. The patient was advised to call back or seek an in-person evaluation if the symptoms worsen or if the condition fails to improve as anticipated.  I provided52minof non-face-to-face time during this encounter.  Patient: home Provider:[facility] [place] [facility]  Summary: Avarie Tavano is a 39 y.o. female who presents with a history of anxiety and trauma. 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   Suicidal/Homicidal: Pt denies a plan to harm herself or others. Pt encouraged to call 911 or go to nearest emergency department in the event of an emergency.  Therapist Response: CSW assessed for changes in daily functioning, mood and behavior. CSW reviewed with pt treatment  plan assessed progress. CSW provided supportive listening as pt discussed implementing healthy boundaries when interacting with others. CSW probed for feedback on past behavior and pt reports she use to internalize other people's problems. CSW prompted pt to identify when this changed and changes that have occurred as a result of no longer doing this. CSW praised pt for maintaining healthy lifestyle and discussed self care techniques.  Plan: Return again in 1 weeks.  Diagnosis: Axis I:Post traumatic stress disorder                                     Anxiety     Axis II: No diagnosis    Yvette Rack, LCSW 03/02/2020

## 2020-03-09 ENCOUNTER — Ambulatory Visit (INDEPENDENT_AMBULATORY_CARE_PROVIDER_SITE_OTHER): Payer: 59 | Admitting: Clinical

## 2020-03-09 DIAGNOSIS — F4310 Post-traumatic stress disorder, unspecified: Secondary | ICD-10-CM

## 2020-03-09 DIAGNOSIS — F419 Anxiety disorder, unspecified: Secondary | ICD-10-CM | POA: Diagnosis not present

## 2020-03-09 NOTE — Progress Notes (Signed)
   THERAPIST PROGRESS NOTE  Session Time: 8am  Participation Level: Active  Behavioral Response: Unknown appearanceAlertEuthymic  Type of Therapy: Individual Therapy  Treatment Goals addressed: Communication: Assertiveness techniques and boundaries   Nicole Wallace's Person Centered Plan, completed on 02/24/2020 Meet withclinicianvirtually on a weekly to biweekly basis for therapy sessions to assess progress towards personal goals and receive assistance/feedback as needed to address barriers to success Meet withPCP/psychiatristregularly to address efficacy of medications and adjust regimen/dose as needed.  Takemedicationsdaily as prescribed to aid in symptom management and improvement in daily functioning. Maintain and implement healthy boundaries by avoiding accepting responsibility for other's decisions and displaying assertive communication when interacting with others.  Develop and implement stress management techniques as evidenced by engaging in positive stress relieving activities, identify common stressors faced on regular basis and impact it can have on daily functioning.   Interventions: Assertiveness Training and Supportive   I connected withJanelle , on10/28/21at8:00am by phone and verified that I am speaking with the correct person using two identifiers. Pt was scheduled for a video session but experienced technical difficulty with connection. I discussed the limitations, risks, security and privacy concerns of performing an evaluation and management service byphoneand the availability of in person appointments. I also discussed with the patient that there may be a patient responsible charge related to this service. The patient expressed understanding and agreed to proceed. I discussed the assessment and treatment plan with the patient. The patient was provided an opportunity to ask questions and all were answered. The patient agreed with the plan and demonstrated  an understanding of the instructions. The patient was advised to call back or seek an in-person evaluation if the symptoms worsen or if the condition fails to improve as anticipated.  I provided42minof non-face-to-face time during this encounter.  Patient:home Provider:[facility]  Summary: Nicole Wallace is a 39 y.o. female who presents with a history of trauma and anxiety. When asked what has been going well since last session, pt reports she continues to perform daily physical exercises and able to identify the benefits of engaging in exercise. When asked about challenges since last session, pt discussed work related stress and conflict in relationship. Pt making progress toward achieving goals of stress reduction and implementing healthy boundaries.  Suicidal/Homicidal: Pt denies a plan or intent to harm herself or others. Pt encouraged to call 911 or go to nearest emergency department in the event of an emergency.  Therapist Response: CSW prompted pt to identify triggers. Triggers were processed to assist pt in gaining awareness of the root cause. CSW provided supportive listening as pt discussed challenges with employer and significant other. CSW modeled and role played with pt using I messaging to express feelings directly and in a constructive manner. CSW verbally praised pt for her consistency in maintaining healthy boundaries in her personal and professional life. CSW continues to encourage pt to practice self care routine to promote health and wellness.  Plan: Return again in 1 weeks.  Diagnosis: Axis I: Post traumatic stress disorder    Anxiety    Axis II: No diagnosis    Yvette Rack, LCSW 03/09/2020

## 2020-03-27 ENCOUNTER — Other Ambulatory Visit: Payer: Self-pay

## 2020-04-10 ENCOUNTER — Other Ambulatory Visit: Payer: Self-pay

## 2020-04-10 ENCOUNTER — Telehealth (INDEPENDENT_AMBULATORY_CARE_PROVIDER_SITE_OTHER): Payer: 59 | Admitting: Psychiatry

## 2020-04-10 ENCOUNTER — Encounter (HOSPITAL_COMMUNITY): Payer: Self-pay | Admitting: Psychiatry

## 2020-04-10 DIAGNOSIS — F3181 Bipolar II disorder: Secondary | ICD-10-CM | POA: Diagnosis not present

## 2020-04-10 DIAGNOSIS — F419 Anxiety disorder, unspecified: Secondary | ICD-10-CM | POA: Diagnosis not present

## 2020-04-10 DIAGNOSIS — F4310 Post-traumatic stress disorder, unspecified: Secondary | ICD-10-CM

## 2020-04-10 MED ORDER — TRAZODONE HCL 50 MG PO TABS: 50.0000 mg | ORAL_TABLET | Freq: Every evening | ORAL | 0 refills | Status: DC | PRN

## 2020-04-10 MED ORDER — HYDROXYZINE PAMOATE 25 MG PO CAPS: ORAL_CAPSULE | ORAL | 0 refills | Status: DC

## 2020-04-10 MED ORDER — DIVALPROEX SODIUM 500 MG PO DR TAB: 500.0000 mg | DELAYED_RELEASE_TABLET | Freq: Every day | ORAL | 0 refills | Status: DC

## 2020-04-10 MED ORDER — CITALOPRAM HYDROBROMIDE 40 MG PO TABS: ORAL_TABLET | ORAL | 0 refills | Status: DC
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
## 2020-04-11 ENCOUNTER — Ambulatory Visit (INDEPENDENT_AMBULATORY_CARE_PROVIDER_SITE_OTHER): Payer: 59 | Admitting: Clinical

## 2020-04-11 ENCOUNTER — Other Ambulatory Visit: Payer: Self-pay

## 2020-04-11 DIAGNOSIS — F419 Anxiety disorder, unspecified: Secondary | ICD-10-CM | POA: Diagnosis not present

## 2020-04-11 DIAGNOSIS — F4310 Post-traumatic stress disorder, unspecified: Secondary | ICD-10-CM | POA: Diagnosis not present

## 2020-04-11 DIAGNOSIS — F3181 Bipolar II disorder: Secondary | ICD-10-CM

## 2020-04-11 NOTE — Progress Notes (Signed)
   THERAPIST PROGRESS NOTE  Session Time: 8am  Participation Level: Active  Behavioral Response: unknow appearanceAlertEuthymic  Type of Therapy: Individual Therapy  Treatment Goals addressed:stress management  Interventions: Strength-based and Other: psychoeducation  Virtual Visit via Telephone Note  I connected with Nicole Wallace on 04/11/20 at  9:00 AM EST by telephone and verified that I am speaking with the correct person using two identifiers.  Location: Patient: Home Provider: [facility]   I discussed the limitations, risks, security and privacy concerns of performing an evaluation and management service by telephone and the availability of in person appointments. I also discussed with the patient that there may be a patient responsible charge related to this service. The patient expressed understanding and agreed to proceed.   I discussed the assessment and treatment plan with the patient. The patient was provided an opportunity to ask questions and all were answered. The patient agreed with the plan and demonstrated an understanding of the instructions.   The patient was advised to call back or seek an in-person evaluation if the symptoms worsen or if the condition fails to improve as anticipated.  I provided 45 minutes of non-face-to-face time during this encounter.  Summary: Nicole Wallace is a 39 y.o. female who presents with a history of bipolar disorder, trauma and anxiety. Pt reports she went on vacation to [place] and was able to get some relaxation. Pt states visiting family members during the holiday. Pt does not report any changes in her mood or behavior. Pt presents in a euthymic manner and reports she continues to practice daily self care routine. Pt discussed work related stress but demonstrates ability to assert self. Pt states she is able to multi task and acknowledges in the past having to many to do's caused increased anxiety. Pt states receiving validation from  her staff helps reduce her stress.  Suicidal/Homicidal:Pt denies a plan or intent to harm herself or others. Pt encouraged to call 911 or go to nearest emergency department in the event of an emergency.  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 Pt supported for implementing assertiveness that has led to effective communication of her feelings and needs. Provided pt with stress management techniques, emphasizing importance of keeping things in perspective to manage stress. Reviewed treatment plan and pt making progress toward achieving goal of implementing healthy boundaries and stress management techniques.  Plan: Return again in 1 weeks.  Diagnosis: Axis I: PTSD    Anxiety    Bipolar II disorder    Axis II: No diagnosis    Yvette Rack, LCSW 04/11/2020

## 2020-05-04 ENCOUNTER — Other Ambulatory Visit: Payer: Self-pay

## 2020-05-04 ENCOUNTER — Ambulatory Visit (INDEPENDENT_AMBULATORY_CARE_PROVIDER_SITE_OTHER): Payer: 59 | Admitting: Clinical

## 2020-05-04 DIAGNOSIS — F419 Anxiety disorder, unspecified: Secondary | ICD-10-CM | POA: Diagnosis not present

## 2020-05-04 DIAGNOSIS — F3181 Bipolar II disorder: Secondary | ICD-10-CM

## 2020-05-04 NOTE — Progress Notes (Signed)
   THERAPIST PROGRESS NOTE  Session Time: 3pm  Participation Level: Active  Behavioral Response: unknown appearanceAlertAnxious  Type of Therapy: Individual Therapy  Treatment Goals addressed: Anxiety  Relationship conflict Interventions: Supportive  Virtual Visit via Telephone Note  I connected with Nicole Wallace on 05/04/20 at  3:00 PM EST by telephone and verified that I am speaking with the correct person using two identifiers.  Location: Patient: Home  Provider: Office   I discussed the limitations, risks, security and privacy concerns of performing an evaluation and management service by telephone and the availability of in person appointments. I also discussed with the patient that there may be a patient responsible charge related to this service. The patient expressed understanding and agreed to proceed.   I discussed the assessment and treatment plan with the patient. The patient was provided an opportunity to ask questions and all were answered. The patient agreed with the plan and demonstrated an understanding of the instructions.   The patient was advised to call back or seek an in-person evaluation if the symptoms worsen or if the condition fails to improve as anticipated.  I provided 50 minutes of non-face-to-face time during this encounter.  Summary: Nicole Wallace is a 39 y.o. female who presents with a history of bipolar disorder and anxiety. Pt discussed having relationship conflict with her partner. Pt states "he treats me like property." Pt reports other people have noticed how he treats her. Pt says she has been dating partner for several years and describes them as having a complicated relationship. Pt discussed a recent encounter with a female colleague and reports feeling "guilty" for her behavior. Pt says her guilt is attributed to her partner recently doing a thoughtful and kind gesture for her.  Suicidal/Homicidal: Pt denies plan or intent to harm self or  others. Pt encouraged to call 911 or go to closest emergency department in the event of an emergency.  Therapist Response: CSW actively listened as the pt discussed dynamics of relationship. CSW probed for pt feelings toward partner and expectations of relationship. CSW discussed with pt characteristics of a healthy and unhealthy relationship. Pt demonstrates insight and knowledge of traits of healthy relationship. CSW assessed for safety concerns, pt denies safety concerns. Assigned homework-does relationship compromise her health/well being, is she happy in the relationship and if so is she able to sustain her happiness if she remains in the relationship. Assignment will be reviewed during next session.  Plan: Return again in 1 weeks.  Diagnosis: Axis I: Anxiety                                     Bipolar II disorder   Axis II: No diagnosis    Yvette Rack, LCSW 05/04/2020

## 2020-05-11 ENCOUNTER — Ambulatory Visit (INDEPENDENT_AMBULATORY_CARE_PROVIDER_SITE_OTHER): Payer: 59 | Admitting: Clinical

## 2020-05-11 ENCOUNTER — Other Ambulatory Visit: Payer: Self-pay

## 2020-05-11 DIAGNOSIS — F419 Anxiety disorder, unspecified: Secondary | ICD-10-CM | POA: Diagnosis not present

## 2020-05-11 DIAGNOSIS — F3181 Bipolar II disorder: Secondary | ICD-10-CM | POA: Diagnosis not present

## 2020-05-11 NOTE — Progress Notes (Signed)
   THERAPIST PROGRESS NOTE  Session Time: 2pm  Participation Level: Active  Behavioral Response: Unknown appearanceAlertPleasant  Type of Therapy: Individual Therapy  Treatment Goals addressed:Boundaries  Interventions: Other: psychoeducation  Virtual Visit via Telephone Note  I connected with Nicole Wallace on 05/11/20 at  2:00 PM EST by telephone and verified that I am speaking with the correct person using two identifiers.  Location: Patient: Home Provider: Office   I discussed the limitations, risks, security and privacy concerns of performing an evaluation and management service by telephone and the availability of in person appointments. I also discussed with the patient that there may be a patient responsible charge related to this service. The patient expressed understanding and agreed to proceed.   I discussed the assessment and treatment plan with the patient. The patient was provided an opportunity to ask questions and all were answered. The patient agreed with the plan and demonstrated an understanding of the instructions.   The patient was advised to call back or seek an in-person evaluation if the symptoms worsen or if the condition fails to improve as anticipated.  I provided 50 minutes of non-face-to-face time during this encounter.  Summary: Nicole Wallace is a 39 y.o. female who presents in a pleasant mood today. Pt reports she enjoyed spending time with her family during the christmas holiday. Pt discussed dynamics of her relationship with her partner. Pt reports having a history of unhealthy relationships with ex-partners she was involved with. Pt discussed some of the concerns in her current relationship and states she is unsure if she can sustain happiness in the relationship. Pt presented with difficulty identifying her values, want, needs in a relationship.   Suicidal/Homicidal: Pt denies plan or intent to harm self or others. Pt encouraged to call 911 or go  to closest emergency department in the event of an emergency.  Therapist Response: CSW provided active listening as pt discussed her history of unhealthy relationships. CSW discussed with pt the importance of establishing and maintaining healthy boundaries in a relationship. CSW prompted pt to identify healthy and unhealthy characteristics of a relationship and probed for feedback on challenges she has faced when trying to set healthy boundaries. Pt encouraged to process and identify her values, needs and wants in a relationship, to be discussed during next session.  Plan: Return again in 2 weeks.  Diagnosis: Axis I: Anxiety Bipolar II disorder    Axis II: No diagnosis    Suzan Slick, LCSW 05/11/2020

## 2020-05-16 ENCOUNTER — Other Ambulatory Visit: Payer: Self-pay | Admitting: Internal Medicine

## 2020-05-16 ENCOUNTER — Ambulatory Visit: Payer: Self-pay | Attending: Internal Medicine

## 2020-05-16 DIAGNOSIS — Z23 Encounter for immunization: Secondary | ICD-10-CM

## 2020-05-16 NOTE — Progress Notes (Signed)
   Covid-19 Vaccination Clinic  Name:  Nicole Wallace    MRN: [id number] DOB: Jan 16, 1981  05/16/2020  [name] was observed post Covid-19 immunization for 15 minutes without incident. She was provided with Vaccine Information Sheet and instruction to access the V-Safe system.   [name] was instructed to call 911 with any severe reactions post vaccine: Marland Kitchen Difficulty breathing  . Swelling of face and throat  . A fast heartbeat  . A bad rash all over body  . Dizziness and weakness   Immunizations Administered    Name Date Dose VIS Date Route   Pfizer COVID-19 Vaccine 05/16/2020  9:44 AM 0.3 mL 03/01/2020 Intramuscular   Manufacturer: [employer], [employer]   Lot: [id number]   NDC: [address]-6

## 2020-06-01 ENCOUNTER — Other Ambulatory Visit: Payer: Self-pay

## 2020-06-01 ENCOUNTER — Ambulatory Visit (INDEPENDENT_AMBULATORY_CARE_PROVIDER_SITE_OTHER): Payer: 59 | Admitting: Clinical

## 2020-06-01 DIAGNOSIS — F3181 Bipolar II disorder: Secondary | ICD-10-CM

## 2020-06-01 DIAGNOSIS — F419 Anxiety disorder, unspecified: Secondary | ICD-10-CM

## 2020-06-01 NOTE — Progress Notes (Signed)
   THERAPIST PROGRESS NOTE  Session Time: 2pm  Participation Level: Active  Behavioral Response: unknown appearanceAlertEuthymic  Type of Therapy: Individual Therapy  Treatment Goals addressed:Healthy boundaries  Interventions: Other: psychoeducation  Virtual Visit via Telephone Note  I connected [name]@ on 06/01/20 at  2:00 PM EST by telephone and verified that I am speaking with the correct person using two identifiers.  Location: Patient: HOme Provider: [facility]   I discussed the limitations, risks, security and privacy concerns of performing an evaluation and management service by telephone and the availability of in person appointments. I also discussed with the patient that there may be a patient responsible charge related to this service. The patient expressed understanding and agreed to proceed.   I discussed the assessment and treatment plan with the patient. The patient was provided an opportunity to ask questions and all were answered. The patient agreed with the plan and demonstrated an understanding of the instructions.   The patient was advised to call back or seek an in-person evaluation if the symptoms worsen or if the condition fails to improve as anticipated.  I provided 50 minutes of non-face-to-face time during this encounter.  Summary: Nicole Wallace is a 40 y.o. female who presents in a euthymic manner. When asked what has been going well since the last session, pt states she has received positive feedback from her manager on her work performance. Pt says this feedback has eased some of her stress. Pt continues to report doing well maintaining daily self care routine. Pt says she attends spin class frequently and is considering joining a kickboxing class.Pt discussed experiencing "guilt" if she does not workout daily. Pt reports "If I miss a day of spin class I think I'll gain 20lbs." Pt able to identify irrational thinking and demonstrates insight. When  asked to rate her mood on scale 1-10(10=good) pt says she is a 8/10. Pt states for the past two weeks she has had difficulty falling asleep. Pt reports she would find herself thinking about work.   Suicidal/Homicidal: Pt denies plan or intent to harm self or others. Pt encouraged to call 911 or go to closest emergency department in the event of an emergency.  Therapist Response: CSW discussed with pt sleep hygiene tips. CSW provided pt with information and tips on maintaining healthy boundaries. CSW provided active listening as pt identified her wants, needs and desires in her relationship. CSW probed for feedback as pt discussed her past history of being a "people pleaser." CSW introduced to pt 3 mindstates to assist pt in addressing irrational thinking that is triggered if she misses a day of fitness.  Plan: Return again in 2 weeks.  Diagnosis: Axis I: Anxiety Bipolar II disorder    Axis II: No diagnosis    Yvette Rack, LCSW 06/01/2020

## 2020-06-08 ENCOUNTER — Other Ambulatory Visit: Payer: Self-pay

## 2020-06-08 ENCOUNTER — Ambulatory Visit (INDEPENDENT_AMBULATORY_CARE_PROVIDER_SITE_OTHER): Payer: 59 | Admitting: Clinical

## 2020-06-08 DIAGNOSIS — F3181 Bipolar II disorder: Secondary | ICD-10-CM

## 2020-06-08 DIAGNOSIS — F419 Anxiety disorder, unspecified: Secondary | ICD-10-CM
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
## 2020-06-15 ENCOUNTER — Ambulatory Visit (INDEPENDENT_AMBULATORY_CARE_PROVIDER_SITE_OTHER): Payer: 59 | Admitting: Clinical

## 2020-06-15 ENCOUNTER — Other Ambulatory Visit: Payer: Self-pay

## 2020-06-15 DIAGNOSIS — F419 Anxiety disorder, unspecified: Secondary | ICD-10-CM

## 2020-06-15 DIAGNOSIS — F3181 Bipolar II disorder: Secondary | ICD-10-CM | POA: Diagnosis not present

## 2020-06-15 NOTE — Progress Notes (Signed)
   THERAPIST PROGRESS NOTE  Session Time: 2pm  Participation Level: Active  Behavioral Response: Unknown appearanceAlert"Relaxed"  Type of Therapy: Individual Therapy  Treatment Goals addressed: Coping  Interventions: DBT Virtual Visit via Telephone Note  I connected with Nicole Wallace on 06/15/20 at  2:00 PM EST by telephone and verified that I am speaking with the correct person using two identifiers.  Location: Patient: Home Provider: Office   I discussed the limitations, risks, security and privacy concerns of performing an evaluation and management service by telephone and the availability of in person appointments. I also discussed with the patient that there may be a patient responsible charge related to this service. The patient expressed understanding and agreed to proceed.   I discussed the assessment and treatment plan with the patient. The patient was provided an opportunity to ask questions and all were answered. The patient agreed with the plan and demonstrated an understanding of the instructions.   The patient was advised to call back or seek an in-person evaluation if the symptoms worsen or if the condition fails to improve as anticipated.  I provided 45 minutes of non-face-to-face time during this encounter. Summary: Nicole Wallace is a 40 y.o. female who presents in a pleasant mood. Pt reports a decrease in stress this week. 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 Also, pt reports she has enrolled in school and will begin taking classes in the summer. Pt rates mood as 7/10(10=good). Pt discussed improvement in relationship with partner. Pt continues to report participating in daily physical exercise that she identifies as an activity that helps counteract her stress.  Suicidal/Homicidal: Pt denies SI/HI, no plan or intent to harm self reported.  Therapist Response: CSW assessed for changes in daily  functioning, mood and behavior. CSW assisted pt in participation in stress exploration activity to identify factors that contribute to her stress and factors that protect against stress. CSW reviewed with pt protective factors discussed during previous session. CSW utilized dbt to discuss with pt the three mind states and influence mindstates have on her interactions with her staff and management. CSW verbally praised pt for the progress she is making toward achieving goals of implementing stress management techniques and maintaining healthy boundaries through the use of exercising and creating healthy boundaries with employer.  Plan: Return again in 1 weeks.  Diagnosis: Axis I: Anxiety Bipolar II disorder    Axis II: No diagnosis    Yvette Rack, LCSW 06/15/2020

## 2020-06-28 ENCOUNTER — Telehealth: Payer: Self-pay | Admitting: Gastroenterology

## 2020-07-10 ENCOUNTER — Encounter (HOSPITAL_COMMUNITY): Payer: Self-pay | Admitting: Psychiatry

## 2020-07-10 ENCOUNTER — Other Ambulatory Visit: Payer: Self-pay

## 2020-07-10 ENCOUNTER — Telehealth (INDEPENDENT_AMBULATORY_CARE_PROVIDER_SITE_OTHER): Payer: 59 | Admitting: Psychiatry

## 2020-07-10 DIAGNOSIS — F419 Anxiety disorder, unspecified: Secondary | ICD-10-CM | POA: Diagnosis not present

## 2020-07-10 DIAGNOSIS — F3181 Bipolar II disorder: Secondary | ICD-10-CM | POA: Diagnosis not present

## 2020-07-10 DIAGNOSIS — F4310 Post-traumatic stress disorder, unspecified: Secondary | ICD-10-CM | POA: Diagnosis not present

## 2020-07-10 MED ORDER — HYDROXYZINE PAMOATE 25 MG PO CAPS: ORAL_CAPSULE | ORAL | 0 refills | Status: DC

## 2020-07-10 MED ORDER — CITALOPRAM HYDROBROMIDE 40 MG PO TABS: ORAL_TABLET | ORAL | 0 refills | Status: DC

## 2020-07-10 MED ORDER — TRAZODONE HCL 50 MG PO TABS: 50.0000 mg | ORAL_TABLET | Freq: Every evening | ORAL | 0 refills | Status: DC | PRN

## 2020-07-10 MED ORDER — DIVALPROEX SODIUM 500 MG PO DR TAB: 500.0000 mg | DELAYED_RELEASE_TABLET | Freq: Every day | ORAL | 0 refills | Status: DC
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
## 2020-07-11 ENCOUNTER — Other Ambulatory Visit: Payer: Self-pay

## 2020-07-11 ENCOUNTER — Ambulatory Visit (INDEPENDENT_AMBULATORY_CARE_PROVIDER_SITE_OTHER): Payer: 59 | Admitting: Clinical

## 2020-07-11 DIAGNOSIS — Z566 Other physical and mental strain related to work: Secondary | ICD-10-CM | POA: Diagnosis not present

## 2020-07-11 DIAGNOSIS — F419 Anxiety disorder, unspecified: Secondary | ICD-10-CM

## 2020-07-11 DIAGNOSIS — F3181 Bipolar II disorder: Secondary | ICD-10-CM

## 2020-07-11 NOTE — Progress Notes (Signed)
   THERAPIST PROGRESS NOTE  Session Time: 11am  Participation Level: Active  Behavioral Response: unknown appearanceAlertpleasant  Type of Therapy: Individual Therapy  Treatment Goals addressed: Coping  Interventions: Supportive  Virtual Visit via Telephone Note  I connected with Nicole Wallace on 07/11/20 at 11:00 AM EST by telephone and verified that I am speaking with the correct person using two identifiers.  Location: Patient: Home  Provider: Office   I discussed the limitations, risks, security and privacy concerns of performing an evaluation and management service by telephone and the availability of in person appointments. I also discussed with the patient that there may be a patient responsible charge related to this service. The patient expressed understanding and agreed to proceed.   I discussed the assessment and treatment plan with the patient. The patient was provided an opportunity to ask questions and all were answered. The patient agreed with the plan and demonstrated an understanding of the instructions.   The patient was advised to call back or seek an in-person evaluation if the symptoms worsen or if the condition fails to improve as anticipated.  I provided 45 minutes of non-face-to-face time during this encounter.  Summary: Nicole Wallace is a 40 y.o. female who reports experiencing "off and on" panic attacks that she believes may be triggered by work related stress. To manage her anxiety she reports " turning off the tv, resting in the peace and quiet" Pt reports she communicated these attacks to psychiatrist, no change in medication reported. When asked about things that have been going well, pt states she received great staff evaluation and increase in wage. Pt says she is still participating in spin class and finds this beneficial to improve overall mood. Pt discussed not having any friends and a limited circle of support. Per pt report, she does not enjoy  engaging in social events and prefers to be alone. When writer suggested other methods of self care, pt declined.    Suicidal/Homicidal: Pt denies SI/HI no plan or intent to harm self or others reported.  Therapist Response: CSW discussed with pt potential benefits of participating in support group to broaden circle of support. Pt declined. 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 Pt reports she takes minimal time off "because things at work will fall apart" CSW processed with pt potential negative consequences of her neglecting self care. Pt discussed experiencing burn out as a result of overextending herself. CSW processed with pt about safety behaviors used to avoid anxiety producing situations and actively listened as pt identified alcohol as her safety behavior. Discussed with pt healthier coping methods to manage life stressors.   Plan: Return again in 2 weeks.  Diagnosis: Axis I: Anxiety Bipolar II disorder    Work related stress     Axis II: No diagnosis    Yvette Rack, LCSW 07/11/2020

## 2020-07-14 ENCOUNTER — Ambulatory Visit (INDEPENDENT_AMBULATORY_CARE_PROVIDER_SITE_OTHER): Payer: 59 | Admitting: Gastroenterology

## 2020-07-14 ENCOUNTER — Other Ambulatory Visit: Payer: Self-pay

## 2020-07-14 ENCOUNTER — Encounter: Payer: Self-pay | Admitting: Gastroenterology

## 2020-07-14 VITALS — BP 106/66 | HR 62 | Ht 65.0 in | Wt 175.0 lb

## 2020-07-14 DIAGNOSIS — R1013 Epigastric pain: Secondary | ICD-10-CM

## 2020-07-14 DIAGNOSIS — R140 Abdominal distension (gaseous): Secondary | ICD-10-CM | POA: Diagnosis not present

## 2020-07-14 DIAGNOSIS — R1084 Generalized abdominal pain: Secondary | ICD-10-CM | POA: Diagnosis not present

## 2020-07-14 MED ORDER — LANSOPRAZOLE 30 MG PO CPDR: 30.0000 mg | DELAYED_RELEASE_CAPSULE | Freq: Every day | ORAL | 5 refills | Status: AC

## 2020-07-14 NOTE — Patient Instructions (Signed)
If you are age 42 or older, your body mass index should be between 23-30. Your Body mass index is 29.12 kg/m. If this is out of the aforementioned range listed, please consider follow up with your Primary Care Provider.  If you are age 90 or younger, your body mass index should be between 19-25. Your Body mass index is 29.12 kg/m. If this is out of the aformentioned range listed, please consider follow up with your Primary Care Provider.   We have sent the following medications to your pharmacy for you to pick up at your convenience: Prevacid 30 mg daily.  IMAGING:  . You will be contacted by [facility] (Your caller ID will indicate phone # [phone]) within the next business 2 days to schedule your CT scan of abdomen & pelvis. If you have not heard from them within 2 business days, please call [facility] at [phone] to follow up on the status of your appointment.

## 2020-07-14 NOTE — Progress Notes (Signed)
07/14/2020 Nicole Wallace [id number] [birth year]   HISTORY OF PRESENT ILLNESS: This is a 40 year old female who is new to our office.  She used to be employed here as Landscape architect.  She presents here today with complaints of epigastric abdominal pain and what she thinks is a hernia in her upper abdomen.  She says that on occasion the hernia will pop out and she has to push it back in.  Even when the hernia is not protruding she experiences episodes of epigastric discomfort.  She reports some heartburn and reflux.  She takes Prevacid 30 mg as needed.  She describes general diffuse abdominal bloating that is very painful.  She says that she looks like she is 6 months pregnant.  She has had some changes in her diet over the last couple of years.  Previously she was vegetarian and she started eating some chicken/poultry again.  She says that she is moving her bowels well without issues.  She says that she had a colonoscopy and endoscopy in [place] in 2008.  She recalls having a hyperplastic polyp removed.  They did not recommend another until screening age to her knowledge.  She is self-referred.  Past Medical History:  Diagnosis Date  . Allergy   . Anxiety   . Depression   . Duodenitis    mild  . Fibroid uterus   . 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 Heart murmur   . LSIL (low grade squamous intraepithelial lesion) on Pap smear    colposcopy 03/2010; normal paps after  . Migraine    Past Surgical History:  Procedure Laterality Date  . colonic polyp removed   05/14/2007   non-malignant  . INTRAUTERINE DEVICE INSERTION  10/2018   Mirena    reports that she has never smoked. She has never used smokeless tobacco. She reports current alcohol use. She reports current drug use. Drug: Marijuana. family history includes Aneurysm in her paternal grandfather; Arthritis in her maternal grandmother; Asthma in her maternal aunt, mother, and sister; Cancer in her paternal grandmother; Depression in  her sister; Diabetes in her father and maternal aunt; Fibromyalgia in her sister; Heart disease in her maternal aunt, maternal grandfather, and maternal grandmother; Heart disease (age of onset: 75) in her sister; Hyperlipidemia in her maternal grandfather, maternal grandmother, mother, and sister; Hypertension in her father, maternal grandfather, mother, and sister; Kidney disease in her maternal grandmother; Post-traumatic stress disorder in her father; Schizophrenia in her cousin; Scoliosis in her sister. Allergies  Allergen Reactions  . Adhesive [Tape] Other (See Comments)    Causes red marks  . Other     Cats  . Wool Alcohol [Lanolin] Rash      Outpatient Encounter Medications as of 07/14/2020  Medication Sig  . citalopram (CELEXA) 40 MG tablet TAKE ONE TABLET BY MOUTH ONCE DAILY FOR  DEPRESSION  . divalproex (DEPAKOTE) 500 MG DR tablet Take 1 tablet (500 mg total) by mouth daily.  . hydrOXYzine (VISTARIL) 25 MG capsule Take one to two capsule daily for anxiety  . lansoprazole (PREVACID) 30 MG capsule Take 1 capsule (30 mg total) by mouth daily.  . traZODone (DESYREL) 50 MG tablet Take 1 tablet (50 mg total) by mouth at bedtime as needed for sleep.  . valACYclovir (VALTREX) 500 MG tablet Take 1 tablet (500 mg total) by mouth daily.  . [DISCONTINUED] lansoprazole (PREVACID) 30 MG capsule Take 1 capsule by mouth once daily   No facility-administered encounter medications on file as of  07/14/2020.     REVIEW OF SYSTEMS  : All other systems reviewed and negative except where noted in the History of Present Illness.   PHYSICAL EXAM: BP 106/66   Pulse 62   Ht 5\' 5"  (1.651 m)   Wt 175 lb (79.4 kg)   BMI 29.12 kg/m  General: Well developed AA female in no acute distress Head: Normocephalic and atraumatic Eyes:  Sclerae anicteric, conjunctiva pink. Ears: Normal auditory acuity Lungs: Clear throughout to auscultation; no W/R/R. Heart: Regular rate and rhythm; no M/R/G. Abdomen: Soft,  non-distended.  BS present.  Mild diffuse TTP.  No definite hernia noted. Musculoskeletal: Symmetrical with no gross deformities  Skin: No lesions on visible extremities Extremities: No edema  Neurological: Alert oriented x 4, grossly non-focal Psychological:  Alert and cooperative. Normal mood and affect  ASSESSMENT AND PLAN: *40 year old female with complaints of generalized abdominal bloating/distention and epigastric abdominal pain: She describes sensation of what she thinks is a hernia that pops out above her umbilicus at midline.  Nothing was seen on exam today.  She describes the bloating, distention, and pain even when the hernia does not seem to be protruding.  She has some diffuse mild tenderness on exam today.  Reports some heartburn/reflux as well.  Takes Prevacid 30 mg periodically.  I encouraged her to begin taking this daily.  Prescription sent to pharmacy.  May need EGD, but first we will start with CT scan of the abdomen and pelvis with contrast.   CC:  No ref. provider found

## 2020-07-15 NOTE — Progress Notes (Signed)
Attending Physician's Attestation   I have reviewed the chart.   I agree with the Advanced Practitioner's note, impression, and recommendations with any updates as below. Agree with imaging and likely EGD thereafter.  Query Hiatal hernia based on description.   Justice Britain, MD [facility]oscopy Office # [phone]

## 2020-07-18 ENCOUNTER — Other Ambulatory Visit: Payer: Self-pay

## 2020-07-18 ENCOUNTER — Ambulatory Visit (INDEPENDENT_AMBULATORY_CARE_PROVIDER_SITE_OTHER): Payer: 59 | Admitting: Clinical

## 2020-07-18 DIAGNOSIS — F419 Anxiety disorder, unspecified: Secondary | ICD-10-CM

## 2020-07-18 DIAGNOSIS — Z566 Other physical and mental strain related to work: Secondary | ICD-10-CM | POA: Diagnosis not present

## 2020-07-18 DIAGNOSIS — F3181 Bipolar II disorder: Secondary | ICD-10-CM | POA: Diagnosis not present

## 2020-07-18 NOTE — Progress Notes (Signed)
   THERAPIST PROGRESS NOTE  Session Time: 1pm  Participation Level: Active  Behavioral Response: unknown appearanceAlert"tired"  Type of Therapy: Individual Therapy  Treatment Goals addressed: Anxiety  Interventions: CBT  Virtual Visit via Telephone Note  I connected with Nicole Wallace on 07/18/20 at  1:00 PM EST by telephone and verified that I am speaking with the correct person using two identifiers.  Location: Patient: Home Provider: Office   I discussed the limitations, risks, security and privacy concerns of performing an evaluation and management service by telephone and the availability of in person appointments. I also discussed with the patient that there may be a patient responsible charge related to this service. The patient expressed understanding and agreed to proceed.   I discussed the assessment and treatment plan with the patient. The patient was provided an opportunity to ask questions and all were answered. The patient agreed with the plan and demonstrated an understanding of the instructions.   The patient was advised to call back or seek an in-person evaluation if the symptoms worsen or if the condition fails to improve as anticipated.  I provided 50 minutes of non-face-to-face time during this encounter.  Summary: Nicole Wallace is a 40 y.o. female who describes her mood as "tired" Pt reports no change in mood or behavior. Pt states work is busy and she is still working on finding work life balance. Pt states she is still going to the gym daily and says if she misses a day a the gym feels she will lose progress. In the past, pt reports when she has missed days of physical activity she gained weight. Pt admits she has become "determined" on maintaining a healthy lifestyle and acknowledges irrational to think progress will be lost if she misses a day of the gym. Additionally, pt has mentioned feeling like progress will be lost if she misses time from work to go on  a vacation.  Suicidal/Homicidal: Pt denies SI/HI no plan or intent to harm self or others reported.  Therapist Response: CSW brought to pt attention irrational parallel between being  gym attendance and loss of progress if not present at work. CSW discussed with pt utilizing socratic questions to challenge irrational thinking. CSW modeled how to use socratic questions and assessed pt understanding of concept. CSW continues to encourage pt to practice daily self care to improve overall well being.  Plan: Return again in 1 weeks.  Diagnosis: Axis I: Anxiety Bipolar II disorder                                     Work related stress    Axis II: No diagnosis    Yvette Rack, LCSW 07/18/2020

## 2020-07-25 ENCOUNTER — Other Ambulatory Visit: Payer: Self-pay

## 2020-07-25 ENCOUNTER — Ambulatory Visit (INDEPENDENT_AMBULATORY_CARE_PROVIDER_SITE_OTHER): Payer: 59 | Admitting: Clinical

## 2020-07-25 DIAGNOSIS — Z566 Other physical and mental strain related to work: Secondary | ICD-10-CM

## 2020-07-25 DIAGNOSIS — F419 Anxiety disorder, unspecified: Secondary | ICD-10-CM

## 2020-07-25 DIAGNOSIS — F3181 Bipolar II disorder: Secondary | ICD-10-CM

## 2020-07-25 NOTE — Progress Notes (Signed)
   THERAPIST PROGRESS NOTE  Session Time: 11am  Participation Level: Active  Behavioral Response: unknown appearanceAlertEuthymic  Type of Therapy: Individual Therapy  Treatment Goals addressed: Anxiety  Interventions: CBT  Virtual Visit via Telephone Note  I connected with Nicole Wallace on 07/25/20 at 11:00 AM EDT by telephone and verified that I am speaking with the correct person using two identifiers.  Location: Patient: home Provider: office   I discussed the limitations, risks, security and privacy concerns of performing an evaluation and management service by telephone and the availability of in person appointments. I also discussed with the patient that there may be a patient responsible charge related to this service. The patient expressed understanding and agreed to proceed.   I discussed the assessment and treatment plan with the patient. The patient was provided an opportunity to ask questions and all were answered. The patient agreed with the plan and demonstrated an understanding of the instructions.   The patient was advised to call back or seek an in-person evaluation if the symptoms worsen or if the condition fails to improve as anticipated.  I provided 40 minutes of non-face-to-face time during this encounter.  Summary: Nicole Wallace is a 40 y.o. female who describes her mood as "great" pt reports she is still exercising routinely. Pt reports she would like to start spending more time in nature and says she plans to start making time to do so. Per pt reports she continues to work toward finding healthy work life balance and appears to be proud of the progress she is making. Pt reports improvement in mood and is making progress toward achieving treatment goals.  Suicidal/Homicidal: Pt denies SI/HI no plan or intent to harm self or others reported.  Therapist Response: CSW asssessed for changes in mood and behavior. CSW discussed with pt what core beliefs are and  how they are used to interpret an experience. CSW discussed with pt potential consequences of negative core beliefs and prompted her to identify times where the evidence contradicted her negative core belief. Pt identified "I am a bad person" a negative core belief and says she feels this way due to making "dumb mistakes." CSW actively listened as pt states her friends challenge her thinking by assisting her in identifying the kind things she does for others. CSW probed for feedback on things she is grateful for.  Plan: Return again in 1 weeks.  Diagnosis: Axis I:  Anxiety Bipolar II disorder Work related stress    Axis II: No diagnosis    Yvette Rack, LCSW 07/25/2020

## 2020-08-07 ENCOUNTER — Telehealth: Payer: Self-pay | Admitting: Gastroenterology

## 2020-08-11 ENCOUNTER — Ambulatory Visit (HOSPITAL_BASED_OUTPATIENT_CLINIC_OR_DEPARTMENT_OTHER): Admission: RE | Admit: 2020-08-11 | Payer: 59 | Source: Ambulatory Visit

## 2020-08-17 ENCOUNTER — Ambulatory Visit (HOSPITAL_BASED_OUTPATIENT_CLINIC_OR_DEPARTMENT_OTHER): Admission: RE | Admit: 2020-08-17 | Payer: 59 | Source: Ambulatory Visit

## 2020-08-17 ENCOUNTER — Other Ambulatory Visit: Payer: Self-pay

## 2020-08-17 ENCOUNTER — Telehealth (INDEPENDENT_AMBULATORY_CARE_PROVIDER_SITE_OTHER): Payer: 59 | Admitting: Psychiatry

## 2020-08-17 ENCOUNTER — Encounter (HOSPITAL_COMMUNITY): Payer: Self-pay | Admitting: Psychiatry

## 2020-08-17 VITALS — Wt 175.0 lb

## 2020-08-17 DIAGNOSIS — F3181 Bipolar II disorder: Secondary | ICD-10-CM | POA: Diagnosis not present

## 2020-08-17 MED ORDER — TOPIRAMATE 25 MG PO TABS: ORAL_TABLET | ORAL | 1 refills | Status: DC
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
## 2020-08-24 ENCOUNTER — Ambulatory Visit (HOSPITAL_BASED_OUTPATIENT_CLINIC_OR_DEPARTMENT_OTHER): Admission: RE | Admit: 2020-08-24 | Payer: 59 | Source: Ambulatory Visit

## 2020-09-15 ENCOUNTER — Other Ambulatory Visit (HOSPITAL_COMMUNITY): Payer: Self-pay | Admitting: Psychiatry

## 2020-09-15 DIAGNOSIS — F3181 Bipolar II disorder: Secondary | ICD-10-CM

## 2020-09-18 ENCOUNTER — Other Ambulatory Visit (HOSPITAL_COMMUNITY): Payer: Self-pay | Admitting: *Deleted

## 2020-09-18 DIAGNOSIS — F3181 Bipolar II disorder: Secondary | ICD-10-CM

## 2020-09-18 MED ORDER — TOPIRAMATE 25 MG PO TABS: ORAL_TABLET | ORAL | 0 refills | Status: DC

## 2020-10-04 ENCOUNTER — Telehealth (INDEPENDENT_AMBULATORY_CARE_PROVIDER_SITE_OTHER): Payer: 59 | Admitting: Psychiatry

## 2020-10-04 ENCOUNTER — Encounter (HOSPITAL_COMMUNITY): Payer: Self-pay | Admitting: Psychiatry

## 2020-10-04 ENCOUNTER — Other Ambulatory Visit: Payer: Self-pay

## 2020-10-04 DIAGNOSIS — F4310 Post-traumatic stress disorder, unspecified: Secondary | ICD-10-CM | POA: Diagnosis not present

## 2020-10-04 DIAGNOSIS — F419 Anxiety disorder, unspecified: Secondary | ICD-10-CM

## 2020-10-04 DIAGNOSIS — F3181 Bipolar II disorder: Secondary | ICD-10-CM

## 2020-10-04 MED ORDER — TRAZODONE HCL 50 MG PO TABS
50.0000 mg | ORAL_TABLET | Freq: Every evening | ORAL | 0 refills | Status: DC | PRN
Start: 2020-10-04 — End: 2021-01-04

## 2020-10-04 MED ORDER — HYDROXYZINE PAMOATE 25 MG PO CAPS: ORAL_CAPSULE | ORAL | 0 refills | Status: DC

## 2020-10-04 MED ORDER — TOPIRAMATE 50 MG PO TABS: ORAL_TABLET | ORAL | 0 refills | Status: DC

## 2020-10-04 MED ORDER — CITALOPRAM HYDROBROMIDE 40 MG PO TABS: ORAL_TABLET | ORAL | 0 refills | Status: DC
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
## 2020-10-05 ENCOUNTER — Ambulatory Visit (INDEPENDENT_AMBULATORY_CARE_PROVIDER_SITE_OTHER): Payer: 59 | Admitting: Clinical

## 2020-10-05 ENCOUNTER — Other Ambulatory Visit: Payer: Self-pay

## 2020-10-05 DIAGNOSIS — F419 Anxiety disorder, unspecified: Secondary | ICD-10-CM | POA: Diagnosis not present

## 2020-10-05 DIAGNOSIS — F3181 Bipolar II disorder: Secondary | ICD-10-CM

## 2020-10-05 NOTE — Progress Notes (Signed)
   THERAPIST PROGRESS NOTE  Session Time: 9am  Participation Level: Active  Behavioral Response: NAAlertEuthymic  Type of Therapy: Individual Therapy  Treatment Goals addressed: Coping  Interventions: Strength-based  Virtual Visit via Telephone Note  I connected with Nicole Wallace on 10/05/20 at  9:00 AM EDT by telephone and verified that I am speaking with the correct person using two identifiers.  Location: Patient: home Provider: office   I discussed the limitations, risks, security and privacy concerns of performing an evaluation and management service by telephone and the availability of in person appointments. I also discussed with the patient that there may be a patient responsible charge related to this service. The patient expressed understanding and agreed to proceed.   I discussed the assessment and treatment plan with the patient. The patient was provided an opportunity to ask questions and all were answered. The patient agreed with the plan and demonstrated an understanding of the instructions.   The patient was advised to call back or seek an in-person evaluation if the symptoms worsen or if the condition fails to improve as anticipated.  I provided 35 minutes of non-face-to-face time during this encounter.  Summary: Nicole Wallace is a 40 y.o. female who presents in euthymic mood. Pt reports work can be stressful but finds it manageable. Pt says she recently had med mgmt appt and is now taking Topamax. Pt says she was previously prescribed Depakote but it caused weight gain. Pt says she has lost 11lbs and is responding well to the medication. Pt says she is looking forward to her mother coming to visit. Pt states she continues to exercise regularly and looking forward to going on vacation in June.   Suicidal/Homicidal:  Pt denies SI/HI no plan or intent to harm self or others reported.  Therapist Response: CSW assessed for changes in mood and behavior. CSW  discussed with pt self care tips and verbally praised her for progress she is making toward achieving treatment goal. CSW actively listened as pt discussed her efforts to maintain and establish healthy work life balance.  Plan: Return again in 1 weeks.  Diagnosis: Axis I: Anxiety Bipolar II disorder     Axis II: No diagnosis    Yvette Rack, LCSW 10/05/2020

## 2020-10-12 ENCOUNTER — Ambulatory Visit (INDEPENDENT_AMBULATORY_CARE_PROVIDER_SITE_OTHER): Payer: Self-pay | Admitting: Clinical

## 2020-10-12 ENCOUNTER — Other Ambulatory Visit: Payer: Self-pay

## 2020-10-12 DIAGNOSIS — F3181 Bipolar II disorder: Secondary | ICD-10-CM

## 2020-10-12 DIAGNOSIS — F419 Anxiety disorder, unspecified: Secondary | ICD-10-CM

## 2020-10-12 NOTE — Progress Notes (Signed)
   THERAPIST PROGRESS NOTE  Session Time: 9am  Participation Level: Active  Behavioral Response: NAAlert"tired"  Type of Therapy: Individual Therapy  Treatment Goals addressed: Coping  Interventions: CBT and Other: psychoeducation  Virtual Visit via Telephone Note  I connected with Nicole Wallace on 10/12/20 at  9:00 AM EDT by telephone and verified that I am speaking with the correct person using two identifiers.  Location: Patient: home Provider: office   I discussed the limitations, risks, security and privacy concerns of performing an evaluation and management service by telephone and the availability of in person appointments. I also discussed with the patient that there may be a patient responsible charge related to this service. The patient expressed understanding and agreed to proceed.   I discussed the assessment and treatment plan with the patient. The patient was provided an opportunity to ask questions and all were answered. The patient agreed with the plan and demonstrated an understanding of the instructions.   The patient was advised to call back or seek an in-person evaluation if the symptoms worsen or if the condition fails to improve as anticipated.  I provided 45 minutes of non-face-to-face time during this encounter.  Summary: Nicole Wallace is a 40 y.o. female who describes mood as "tired" pt says work has been busy and she continues to strive to maintain healthy work life balance. When asked what is draining her, pt says work is. Pt says her job is undergoing some changes and believes throbbing sensation in her teeth occurs from the anxiety she experiences at the start of her work day. Pt says she takes medication, deep breaths and rest to calm herself. Pt discussed her last vacation being in October and employer not appearing to be supportive when she schedules time off. Pt states she has stopped working late and ends her work day at [employer], however finds work  stressful at times. When asked about her social wellness, pt says she prefers to be alone and avoid social settings that trigger her to do "bad things" Pt identifies bad things as drinking and partying. Pt says she want to avoid these things so she spends a lot of time at home and by herself.  Suicidal/Homicidal: Pt denies plan or intent to harm self or others.  Therapist Response: CSW reviewed and provided pt with information on the 8 components of wellness. CSW assigned homework-think about what energizes and drains you, to be discussed next session. CSW provided info on socratic questions to challenge irrational beliefs. CSW dicussed with pt cycle of anxiety and how avoidance can potentially lead to long term growth. CSW encouraged pt to think about if her current lifestyle is sustainable.  Plan: Return again in 1 weeks.  Diagnosis: Axis I: Anxiety Bipolar II disorder    Axis II: No diagnosis    Yvette Rack, LCSW 10/12/2020

## 2020-10-17 ENCOUNTER — Ambulatory Visit (INDEPENDENT_AMBULATORY_CARE_PROVIDER_SITE_OTHER): Payer: Self-pay | Admitting: Clinical

## 2020-10-17 ENCOUNTER — Other Ambulatory Visit: Payer: Self-pay

## 2020-10-17 DIAGNOSIS — Z566 Other physical and mental strain related to work: Secondary | ICD-10-CM

## 2020-10-17 DIAGNOSIS — F3181 Bipolar II disorder: Secondary | ICD-10-CM

## 2020-10-17 DIAGNOSIS — F419 Anxiety disorder, unspecified: Secondary | ICD-10-CM

## 2020-10-17 NOTE — Progress Notes (Signed)
   THERAPIST PROGRESS NOTE  Session Time: 9am  Participation Level: Active  Behavioral Response: NAAlertAnxious  Type of Therapy: Individual Therapy  Treatment Goals addressed: Coping  Interventions: Other: psychoeducation  Virtual Visit via Telephone Note  I connected with Nicole Wallace on 10/17/20 at  9:00 AM EDT by telephone and verified that I am speaking with the correct person using two identifiers.  Location: Patient: home Provider: office   I discussed the limitations, risks, security and privacy concerns of performing an evaluation and management service by telephone and the availability of in person appointments. I also discussed with the patient that there may be a patient responsible charge related to this service. The patient expressed understanding and agreed to proceed.   I discussed the assessment and treatment plan with the patient. The patient was provided an opportunity to ask questions and all were answered. The patient agreed with the plan and demonstrated an understanding of the instructions.   The patient was advised to call back or seek an in-person evaluation if the symptoms worsen or if the condition fails to improve as anticipated.  I provided 48 minutes of non-face-to-face time during this encounter.  Summary: Nicole Wallace is a 40 y.o. female who reports she will be going on vacation tomorrow. Nevertheless pt reports it will need to be a working vacation. Pt says she has never taken a vacation where she had the opportunity to disconnect from work. On vacation, pt says she finds herself checking email or doing other work related task. Pt reports she has started school and finding it challenging to balance work and school. Pt says she spends 58 or more hours per week dedicated to her job. Pt raised concern about her employer merging with a new company that is productivity based. Pt reports she is back to working till [place]: Pt denies  SI/HI no plan or intent to harm self or others reported  Therapist Response: CSW assessed for changes in pt mood and behavior. CSW provided pt with information on stress management techniques. CSW discussed with pt toll stress can have on individuals overall health. CSW actively listened as pt discussed having experienced burn out due to work related stress. CSW processed with pt importance of having strong support system to assist with managing negative effects of stress.  Plan: Return again in 2 weeks.  Diagnosis: Axis I: Anxiety Bipolar II disorder    Work related stress    Axis II: No diagnosis    Yvette Rack, LCSW 10/17/2020

## 2021-01-04 ENCOUNTER — Telehealth (INDEPENDENT_AMBULATORY_CARE_PROVIDER_SITE_OTHER): Payer: Self-pay | Admitting: Psychiatry

## 2021-01-04 ENCOUNTER — Other Ambulatory Visit: Payer: Self-pay

## 2021-01-04 ENCOUNTER — Encounter (HOSPITAL_COMMUNITY): Payer: Self-pay | Admitting: Psychiatry

## 2021-01-04 DIAGNOSIS — F419 Anxiety disorder, unspecified: Secondary | ICD-10-CM

## 2021-01-04 DIAGNOSIS — F4310 Post-traumatic stress disorder, unspecified: Secondary | ICD-10-CM

## 2021-01-04 DIAGNOSIS — F3181 Bipolar II disorder: Secondary | ICD-10-CM

## 2021-01-04 MED ORDER — CITALOPRAM HYDROBROMIDE 40 MG PO TABS
ORAL_TABLET | ORAL | 0 refills | Status: DC
Start: 2021-01-04 — End: 2021-03-14

## 2021-01-04 MED ORDER — TOPIRAMATE 50 MG PO TABS: ORAL_TABLET | ORAL | 0 refills | Status: DC

## 2021-01-04 MED ORDER — TRAZODONE HCL 50 MG PO TABS: 50.0000 mg | ORAL_TABLET | Freq: Every evening | ORAL | 0 refills | Status: DC | PRN
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
## 2021-02-15 ENCOUNTER — Other Ambulatory Visit: Payer: Self-pay

## 2021-02-15 ENCOUNTER — Ambulatory Visit (INDEPENDENT_AMBULATORY_CARE_PROVIDER_SITE_OTHER): Payer: Self-pay | Admitting: Clinical

## 2021-02-15 DIAGNOSIS — F419 Anxiety disorder, unspecified: Secondary | ICD-10-CM

## 2021-02-15 DIAGNOSIS — F3181 Bipolar II disorder: Secondary | ICD-10-CM

## 2021-02-15 NOTE — Progress Notes (Signed)
   THERAPIST PROGRESS NOTE  Session Time: 8am  Participation Level: Active  Behavioral Response: NAAlertpleasant  Type of Therapy: Individual Therapy  Treatment Goals addressed: Coping  Interventions: CBT Virtual Visit via Telephone Note  I connected with Nicole Wallace on 02/15/21 at  8:00 AM EDT by telephone and verified that I am speaking with the correct person using two identifiers.  Location: Patient: home Provider: office   I discussed the limitations, risks, security and privacy concerns of performing an evaluation and management service by telephone and the availability of in person appointments. I also discussed with the patient that there may be a patient responsible charge related to this service. The patient expressed understanding and agreed to proceed.   I discussed the assessment and treatment plan with the patient. The patient was provided an opportunity to ask questions and all were answered. The patient agreed with the plan and demonstrated an understanding of the instructions.   The patient was advised to call back or seek an in-person evaluation if the symptoms worsen or if the condition fails to improve as anticipated.  I provided 50 minutes of non-face-to-face time during this encounter.  Summary: Pt last seen for therapy session on 10/17/20. Pt reports she has been working out of town since June. In July, pt states finding out that her female acquaintence(partner) has been married to his child's mother for two years. Pt states she was taken aback by this news and now has her guard up when interacting with men. Pt says she now sees men as "throw away items" and reports difficulty trusting them. Pt describes men as "entertainment" and reports she is no longer "thinking with emotion" Pt reports a history of failed relationships with men and categorizes all men as being the same.  Suicidal/Homicidal: Pt denies SI/HI no plan, intent or attempt to harm self or others  reported.  Therapist Response: CSW actively listened and supported pt as she discussed recent break up with partner. CSW used cognitive restructuring to discuss  challenging irrational thoughts. Identified examples of cognitive distortions displayed(overgeneralization, jumping to conclusions) and introduced putting thoughts on trial to challenge irrational beliefs. Assisted pt in identifying traits of healthy relationship and consequences of overlooking redflags in a relationship.  Plan: Follow-up appointment in two weeks.  Diagnosis: Axis I: Bipolar II disorder    Anxiety    Axis II: No diagnosis    Yvette Rack, LCSW 02/15/2021

## 2021-03-13 ENCOUNTER — Other Ambulatory Visit: Payer: Self-pay

## 2021-03-13 ENCOUNTER — Ambulatory Visit (INDEPENDENT_AMBULATORY_CARE_PROVIDER_SITE_OTHER): Payer: Self-pay | Admitting: Clinical

## 2021-03-13 DIAGNOSIS — F419 Anxiety disorder, unspecified: Secondary | ICD-10-CM

## 2021-03-13 DIAGNOSIS — F3181 Bipolar II disorder: Secondary | ICD-10-CM

## 2021-03-13 NOTE — Progress Notes (Signed)
   THERAPIST PROGRESS NOTE  Session Time: 8am  Participation Level: Active  Behavioral Response:  unknown appearanceAlert"angry frustrated"  Type of Therapy: Individual Therapy  Treatment Goals addressed: Anger, Anxiety, and Coping  Interventions: DBT Virtual Visit via Telephone Note  I connected with Jalyric Pineda on 03/13/21 at  8:00 AM EDT by telephone and verified that I am speaking with the correct person using two identifiers.  Location: Patient: home Provider: office   I discussed the limitations, risks, security and privacy concerns of performing an evaluation and management service by telephone and the availability of in person appointments. I also discussed with the patient that there may be a patient responsible charge related to this service. The patient expressed understanding and agreed to proceed.   I discussed the assessment and treatment plan with the patient. The patient was provided an opportunity to ask questions and all were answered. The patient agreed with the plan and demonstrated an understanding of the instructions.   The patient was advised to call back or seek an in-person evaluation if the symptoms worsen or if the condition fails to improve as anticipated.  I provided 55 minutes of non-face-to-face time during this encounter.  Summary: Pt describes her mood as "angry and frustrated". Pt states she has been experiencing difficulty getting out of bed and lack of motivation to engage in pleasurable activities. Per pt report, she states she has been traveling for work for the past few months and her self care routine has been broken. Pt says she will return home in December. Pt states she is engaging in minimal physical activity which she says is uncharacteristic of her. Additionally, pt states isolating herself from others. Pt spoke openly about the challenges she experiences with "connecting with sadness and crying" Pt identifies crying and sadness as signs  of weakness and reports these emotions need to be properly aligned with the right situation I.e. loss of a pet or parent. Per pt report, these emotions are not reserved men she has had unhealthy relationships with. Pt describes herself as "shallow" and admits being attracted to men of a certain stature and influence. Pt states this stems from "hanging around superficial people" Pt further states being concerned what her friends will think of her if she chooses the wrong partner.  Suicidal/Homicidal: Pt denies SI/HI, no plan, intent or attempt to harm self or others reported. No psychosis reported. Pt states if she is not able to better manage her mood she will need to receive face to face therapy sessions and receive further psychiatric evaluation. Encouraged to call 911 or go to closest ED in the event of an emergency.  Therapist Response: CSW actively listened as pt discussed history of unhealthy relationships with men. Processed with pt putting her thoughts on trial to challenge irrational beliefs about herself and others. Processed with her facts vs feelings/opinions and prompted pt to identify cost vs benefit in her relationships. Reviewed with pt characteristics of healthy vs unhealthy relationship.  Plan: Return again in 1 weeks.  Diagnosis: Axis I: Bipolar II disorder                                     Anxiety    Axis II: No diagnosis    Yvette Rack, LCSW 03/13/2021

## 2021-03-14 ENCOUNTER — Encounter (HOSPITAL_COMMUNITY): Payer: Self-pay | Admitting: Psychiatry

## 2021-03-14 ENCOUNTER — Other Ambulatory Visit: Payer: Self-pay

## 2021-03-14 ENCOUNTER — Telehealth (HOSPITAL_BASED_OUTPATIENT_CLINIC_OR_DEPARTMENT_OTHER): Payer: Self-pay | Admitting: Psychiatry

## 2021-03-14 DIAGNOSIS — F4310 Post-traumatic stress disorder, unspecified: Secondary | ICD-10-CM

## 2021-03-14 DIAGNOSIS — F419 Anxiety disorder, unspecified: Secondary | ICD-10-CM

## 2021-03-14 DIAGNOSIS — F3181 Bipolar II disorder: Secondary | ICD-10-CM

## 2021-03-14 MED ORDER — TOPIRAMATE 100 MG PO TABS: ORAL_TABLET | ORAL | 0 refills | Status: DC

## 2021-03-14 MED ORDER — CITALOPRAM HYDROBROMIDE 40 MG PO TABS
ORAL_TABLET | ORAL | 0 refills | Status: DC
Start: 2021-03-14 — End: 2021-04-09

## 2021-03-14 MED ORDER — TRAZODONE HCL 50 MG PO TABS: 50.0000 mg | ORAL_TABLET | Freq: Every evening | ORAL | 0 refills | Status: DC | PRN
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
## 2021-03-20 ENCOUNTER — Other Ambulatory Visit: Payer: Self-pay

## 2021-03-20 ENCOUNTER — Ambulatory Visit (INDEPENDENT_AMBULATORY_CARE_PROVIDER_SITE_OTHER): Payer: Self-pay | Admitting: Clinical

## 2021-03-20 DIAGNOSIS — F419 Anxiety disorder, unspecified: Secondary | ICD-10-CM

## 2021-03-20 DIAGNOSIS — F3181 Bipolar II disorder: Secondary | ICD-10-CM

## 2021-03-20 DIAGNOSIS — F4310 Post-traumatic stress disorder, unspecified: Secondary | ICD-10-CM
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
## 2021-03-27 ENCOUNTER — Other Ambulatory Visit: Payer: Self-pay

## 2021-03-27 ENCOUNTER — Ambulatory Visit (INDEPENDENT_AMBULATORY_CARE_PROVIDER_SITE_OTHER): Payer: Self-pay | Admitting: Clinical

## 2021-03-27 DIAGNOSIS — F4310 Post-traumatic stress disorder, unspecified: Secondary | ICD-10-CM

## 2021-03-27 DIAGNOSIS — F3181 Bipolar II disorder: Secondary | ICD-10-CM

## 2021-03-27 DIAGNOSIS — F419 Anxiety disorder, unspecified: Secondary | ICD-10-CM
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
## 2021-04-03 ENCOUNTER — Ambulatory Visit (HOSPITAL_COMMUNITY): Payer: Self-pay | Admitting: Clinical

## 2021-04-03 ENCOUNTER — Other Ambulatory Visit: Payer: Self-pay

## 2021-04-09 ENCOUNTER — Other Ambulatory Visit: Payer: Self-pay

## 2021-04-09 ENCOUNTER — Encounter (HOSPITAL_COMMUNITY): Payer: Self-pay | Admitting: Psychiatry

## 2021-04-09 ENCOUNTER — Telehealth (HOSPITAL_BASED_OUTPATIENT_CLINIC_OR_DEPARTMENT_OTHER): Payer: Self-pay | Admitting: Psychiatry

## 2021-04-09 VITALS — Wt 160.0 lb

## 2021-04-09 DIAGNOSIS — F3181 Bipolar II disorder: Secondary | ICD-10-CM

## 2021-04-09 DIAGNOSIS — F4310 Post-traumatic stress disorder, unspecified: Secondary | ICD-10-CM

## 2021-04-09 DIAGNOSIS — F419 Anxiety disorder, unspecified: Secondary | ICD-10-CM

## 2021-04-09 MED ORDER — TOPIRAMATE 100 MG PO TABS: ORAL_TABLET | ORAL | 0 refills | Status: DC

## 2021-04-09 MED ORDER — TRAZODONE HCL 50 MG PO TABS: 50.0000 mg | ORAL_TABLET | Freq: Every evening | ORAL | 0 refills | Status: DC | PRN

## 2021-04-09 MED ORDER — CITALOPRAM HYDROBROMIDE 40 MG PO TABS
ORAL_TABLET | ORAL | 0 refills | Status: DC
Start: 2021-04-09 — End: 2021-07-09
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
## 2021-04-10 ENCOUNTER — Ambulatory Visit (INDEPENDENT_AMBULATORY_CARE_PROVIDER_SITE_OTHER): Payer: Self-pay | Admitting: Clinical

## 2021-04-10 DIAGNOSIS — F419 Anxiety disorder, unspecified: Secondary | ICD-10-CM

## 2021-04-10 DIAGNOSIS — F4310 Post-traumatic stress disorder, unspecified: Secondary | ICD-10-CM

## 2021-04-10 DIAGNOSIS — F3181 Bipolar II disorder: Secondary | ICD-10-CM

## 2021-04-10 NOTE — Progress Notes (Signed)
   THERAPIST PROGRESS NOTE  Session Time: 8am  Participation Level: Active  Behavioral Response:  uknown appearanceAlertpleasant  Type of Therapy: Individual Therapy  Treatment Goals addressed: Coping  Interventions: Other: psychoeducation Virtual Visit via Telephone Note  I connected with Nicole Wallace on 04/10/21 at  8:00 AM EST by telephone and verified that I am speaking with the correct person using two identifiers.  Location: Patient: home Provider: office   I discussed the limitations, risks, security and privacy concerns of performing an evaluation and management service by telephone and the availability of in person appointments. I also discussed with the patient that there may be a patient responsible charge related to this service. The patient expressed understanding and agreed to proceed.   I discussed the assessment and treatment plan with the patient. The patient was provided an opportunity to ask questions and all were answered. The patient agreed with the plan and demonstrated an understanding of the instructions.   The patient was advised to call back or seek an in-person evaluation if the symptoms worsen or if the condition fails to improve as anticipated.  I provided 50 minutes of non-face-to-face time during this encounter.  Summary: Pt reports she recently returned from [place] in [place]. [place] reports while in [place] she took her vistaril prescription when she began to experience sensory overload.Pt says her overall mood has been "good" Pt states work is busy but manageable. Pt appears to be making improvement with coping with life stressors and does a good job of maintaining boundaries in relationships.  Suicidal/Homicidal: Pt denies SI/HI, no plan, intent or attempt to harm self or others reported. No psychosis reported. Pt encouraged to call 911 or go to closest ED in the event of an emergency  Therapist Response: CSW assessed for changes in mood, behavior  and daily functioning. Todays session spent discussing roles in family systems. CSW actively listened as pt identifies herself as the "golden child" of the family and reports her identity and self worth are attached to her career achievements. CSW probed for feedback on healthy and unhealthy patterns within her family system.  Plan: Return again in 1 weeks.  Diagnosis: Axis I: Bipolar II disorder                                     Anxiety                                     PTSD    Axis II: No diagnosis    Nicole Rack, LCSW 04/10/2021

## 2021-04-16 ENCOUNTER — Telehealth (HOSPITAL_COMMUNITY): Payer: Self-pay | Admitting: Psychiatry

## 2021-04-17 ENCOUNTER — Ambulatory Visit (INDEPENDENT_AMBULATORY_CARE_PROVIDER_SITE_OTHER): Payer: Self-pay | Admitting: Clinical

## 2021-04-17 ENCOUNTER — Other Ambulatory Visit: Payer: Self-pay

## 2021-04-17 DIAGNOSIS — F3181 Bipolar II disorder: Secondary | ICD-10-CM

## 2021-04-17 DIAGNOSIS — F4310 Post-traumatic stress disorder, unspecified: Secondary | ICD-10-CM

## 2021-04-17 DIAGNOSIS — F419 Anxiety disorder, unspecified: Secondary | ICD-10-CM

## 2021-04-17 NOTE — Progress Notes (Signed)
   THERAPIST PROGRESS NOTE  Session Time: 11am  Participation Level: Active  Behavioral Response: Alertpleasant  Type of Therapy: Individual Therapy  Treatment Goals addressed: Coping  Interventions: Supportive Virtual Visit via Telephone Note  I connected with Nicole Wallace on 04/17/21 at 11:00 AM EST by telephone and verified that I am speaking with the correct person using two identifiers.  Location: Patient: home Provider: office   I discussed the limitations, risks, security and privacy concerns of performing an evaluation and management service by telephone and the availability of in person appointments. I also discussed with the patient that there may be a patient responsible charge related to this service. The patient expressed understanding and agreed to proceed.   I discussed the assessment and treatment plan with the patient. The patient was provided an opportunity to ask questions and all were answered. The patient agreed with the plan and demonstrated an understanding of the instructions.   The patient was advised to call back or seek an in-person evaluation if the symptoms worsen or if the condition fails to improve as anticipated.  I provided 50 minutes of non-face-to-face time during this encounter.  Summary: Pt presents in a pleasant mood. Pt says she is becoming "bored" with the person she is dating. Pt says they are not in a committed relationship. Pt identified her reservations about the relationship, stating she does not like his clothing style and calm demeanor. Additionally, pt says she has communicated her expectations to him and they have not been met. Pt admits that she is "shallow" and states her idea of an exciting relationship is receiving material items. Pt does not report about red flags in the relationship. Suicidal/Homicidal: Pt denies SI/HI, no plan, intent or attempt to harm self or others reported. No psychosis reported. Pt encouraged to call 911 or  go to closest ED in the event of an emergency  Therapist Response: CSW processed with pt using assertive communication to express her needs and emotions in the relationship. CSW discussed with pt importance of learning to compromise in relationships and assessed for deal breakers in a relationship. CSW challenged pt to learn and practice healthy communication vs "ghosting" the relationship. CSW discussed  realistic vs unrealistic goals of herself and her partner in the relationship.  Plan: Return again in 2 weeks.  Diagnosis: Axis I: Bipolar II disorder                                     Anxiety                                     PTSD    Axis II: No diagnosis    Yvette Rack, LCSW 04/17/2021

## 2021-05-01 ENCOUNTER — Ambulatory Visit (INDEPENDENT_AMBULATORY_CARE_PROVIDER_SITE_OTHER): Payer: Self-pay | Admitting: Clinical

## 2021-05-01 ENCOUNTER — Other Ambulatory Visit: Payer: Self-pay

## 2021-05-01 DIAGNOSIS — F4310 Post-traumatic stress disorder, unspecified: Secondary | ICD-10-CM

## 2021-05-01 DIAGNOSIS — F3181 Bipolar II disorder: Secondary | ICD-10-CM

## 2021-05-01 DIAGNOSIS — F419 Anxiety disorder, unspecified: Secondary | ICD-10-CM

## 2021-05-01 NOTE — Progress Notes (Signed)
° °  THERAPIST PROGRESS NOTE  Session Time: 9am  Participation Level: Active  Behavioral Response: AlertEuthymic  Type of Therapy: Individual Therapy  Treatment Goals addressed: Coping  Interventions: DBT  Summary:  Pt presents in pleasant mood. Pt says she is in [place] for work but will be returning home tomorrow. Pt says she is planning to spend time with her mother for christmas and on 1/4 will be going on a vacation to [place]. Pt describes her mood as "pretty good" and rates it 8/10(10=exceptional). Pt does not report any barriers or challenges. Pt says she is focusing on herself and this is energizing her. Pt discussed incidents in the past of impulsive decision making and reports she is more cognizant of her choices and reminds herself of negative consequences of impulsive decision making. Pt states she goes to get manicure and pedicure on biweekly basis as a part of her self care routine. Pt reports decline in physical activity due to traveling for work.  Suicidal/Homicidal: Pt denies SI/HI no plan, intent or plan to harm self or others reported.  Therapist Response: Assessed for changes in mood, behavior and daily functioning. Discussed with pt how core beliefs shape view of self, others and the world. Processed origin of core beliefs and prompted pt to identify beliefs developed in her childhood or during traumatic moments in her adulthood.  Plan: Return again in 2 weeks.  Diagnosis: Axis I:  Bipolar II disorder                                     Anxiety                                     PTSD    Axis II: No diagnosis    Yvette Rack, LCSW 05/01/2021

## 2021-05-23 ENCOUNTER — Ambulatory Visit (INDEPENDENT_AMBULATORY_CARE_PROVIDER_SITE_OTHER): Payer: Self-pay | Admitting: Clinical

## 2021-05-23 ENCOUNTER — Other Ambulatory Visit: Payer: Self-pay

## 2021-05-23 DIAGNOSIS — F3181 Bipolar II disorder: Secondary | ICD-10-CM

## 2021-05-23 DIAGNOSIS — F4310 Post-traumatic stress disorder, unspecified: Secondary | ICD-10-CM

## 2021-05-23 DIAGNOSIS — F419 Anxiety disorder, unspecified: Secondary | ICD-10-CM
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
## 2021-06-04 ENCOUNTER — Other Ambulatory Visit (HOSPITAL_COMMUNITY): Payer: Self-pay

## 2021-06-05 ENCOUNTER — Other Ambulatory Visit: Payer: Self-pay

## 2021-06-05 ENCOUNTER — Ambulatory Visit (INDEPENDENT_AMBULATORY_CARE_PROVIDER_SITE_OTHER): Payer: Self-pay | Admitting: Clinical

## 2021-06-05 DIAGNOSIS — F4310 Post-traumatic stress disorder, unspecified: Secondary | ICD-10-CM

## 2021-06-05 DIAGNOSIS — F3181 Bipolar II disorder: Secondary | ICD-10-CM

## 2021-06-05 DIAGNOSIS — F419 Anxiety disorder, unspecified: Secondary | ICD-10-CM
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
## 2021-06-12 ENCOUNTER — Other Ambulatory Visit: Payer: Self-pay

## 2021-06-12 ENCOUNTER — Ambulatory Visit (INDEPENDENT_AMBULATORY_CARE_PROVIDER_SITE_OTHER): Payer: Self-pay | Admitting: Clinical

## 2021-06-12 DIAGNOSIS — F419 Anxiety disorder, unspecified: Secondary | ICD-10-CM

## 2021-06-12 DIAGNOSIS — F3181 Bipolar II disorder: Secondary | ICD-10-CM

## 2021-06-12 DIAGNOSIS — F4310 Post-traumatic stress disorder, unspecified: Secondary | ICD-10-CM

## 2021-06-12 DIAGNOSIS — Z566 Other physical and mental strain related to work: Secondary | ICD-10-CM

## 2021-06-12 NOTE — Progress Notes (Signed)
° °  THERAPIST PROGRESS NOTE  Session Time: 9am  Participation Level: Active  Behavioral Response: Alertpleasant  Type of Therapy: Individual Therapy  Treatment Goals addressed: Coping  Interventions: Reframing Virtual Visit via Telephone Note  I connected with Reese Decandia on 06/12/21 at  9:00 AM EST by telephone and verified that I am speaking with the correct person using two identifiers.  Location: Patient: home Provider: office   I discussed the limitations, risks, security and privacy concerns of performing an evaluation and management service by telephone and the availability of in person appointments. I also discussed with the patient that there may be a patient responsible charge related to this service. The patient expressed understanding and agreed to proceed.   I discussed the assessment and treatment plan with the patient. The patient was provided an opportunity to ask questions and all were answered. The patient agreed with the plan and demonstrated an understanding of the instructions.   The patient was advised to call back or seek an in-person evaluation if the symptoms worsen or if the condition fails to improve as anticipated.  I provided 45 minutes of non-face-to-face time during this encounter.  Summary: Pt presents in pleasant mood. Pt reports she is traveling for work and it is demanding. Pt states she is considering a new job opportunity but has reservation about career change.   Suicidal/Homicidal: Pt denies SI/HI no plan, intent or attempt to harm self or others reported.  Therapist Response: Assessed for changes in mood, behavior and daily functioning. Discussed reframing negative thinking and praised pt for implementing skill recently. Processed with pt positive consequences of redirecting negative thoughts. Discussed ways to be intentional about prioritizing hobbies/physical activity during hectic work day.  Plan: Return again in 1  weeks.  Diagnosis: Axis I: Bipolar II disorder                                     Anxiety                                     PTSD    Work related stress   Axis II: No diagnosis    Yvette Rack, LCSW 06/12/2021

## 2021-06-28 ENCOUNTER — Other Ambulatory Visit: Payer: Self-pay

## 2021-06-28 ENCOUNTER — Ambulatory Visit (HOSPITAL_COMMUNITY): Payer: Self-pay | Admitting: Clinical

## 2021-07-02 ENCOUNTER — Other Ambulatory Visit: Payer: Self-pay

## 2021-07-02 ENCOUNTER — Ambulatory Visit (INDEPENDENT_AMBULATORY_CARE_PROVIDER_SITE_OTHER): Payer: Self-pay | Admitting: Clinical

## 2021-07-02 DIAGNOSIS — Z566 Other physical and mental strain related to work: Secondary | ICD-10-CM

## 2021-07-02 DIAGNOSIS — F419 Anxiety disorder, unspecified: Secondary | ICD-10-CM

## 2021-07-02 DIAGNOSIS — F4310 Post-traumatic stress disorder, unspecified: Secondary | ICD-10-CM

## 2021-07-02 DIAGNOSIS — F3181 Bipolar II disorder: Secondary | ICD-10-CM
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
## 2021-07-09 ENCOUNTER — Encounter (HOSPITAL_COMMUNITY): Payer: Self-pay | Admitting: Psychiatry

## 2021-07-09 ENCOUNTER — Telehealth (HOSPITAL_BASED_OUTPATIENT_CLINIC_OR_DEPARTMENT_OTHER): Payer: Self-pay | Admitting: Psychiatry

## 2021-07-09 ENCOUNTER — Other Ambulatory Visit: Payer: Self-pay

## 2021-07-09 DIAGNOSIS — F3181 Bipolar II disorder: Secondary | ICD-10-CM

## 2021-07-09 DIAGNOSIS — F4310 Post-traumatic stress disorder, unspecified: Secondary | ICD-10-CM

## 2021-07-09 DIAGNOSIS — F419 Anxiety disorder, unspecified: Secondary | ICD-10-CM

## 2021-07-09 MED ORDER — CITALOPRAM HYDROBROMIDE 40 MG PO TABS: ORAL_TABLET | ORAL | 0 refills | Status: DC

## 2021-07-09 MED ORDER — TRAZODONE HCL 50 MG PO TABS: 50.0000 mg | ORAL_TABLET | Freq: Every evening | ORAL | 0 refills | Status: DC | PRN

## 2021-07-09 MED ORDER — TOPIRAMATE 100 MG PO TABS: ORAL_TABLET | ORAL | 0 refills | Status: DC
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
## 2021-07-16 ENCOUNTER — Other Ambulatory Visit: Payer: Self-pay

## 2021-07-16 ENCOUNTER — Ambulatory Visit (INDEPENDENT_AMBULATORY_CARE_PROVIDER_SITE_OTHER): Payer: Self-pay | Admitting: Clinical

## 2021-07-16 DIAGNOSIS — F4310 Post-traumatic stress disorder, unspecified: Secondary | ICD-10-CM

## 2021-07-16 DIAGNOSIS — F3181 Bipolar II disorder: Secondary | ICD-10-CM

## 2021-07-16 DIAGNOSIS — F419 Anxiety disorder, unspecified: Secondary | ICD-10-CM

## 2021-07-16 NOTE — Progress Notes (Signed)
? ?THERAPIST PROGRESS NOTE ? ?Session Time: 3pm ? ?Participation Level: Active ? ?Behavioral Response: Alert"calm" ? ?Type of Therapy: Individual Therapy ? ?Treatment Goals addressed: Maintain and implement healthy boundaries by avoiding accepting responsibility for other?s decisions and displaying assertive communication when interacting with others.  ?Develop and implement stress management techniques as evidenced by engaging in positive stress relieving activities, identify common stressors faced on regular basis and impact it can have on daily functioning.  ? ?ProgressTowards Goals: Progressing ? ?Interventions: Supportive ?Virtual Visit via Telephone Note ? ?I connected with Nicole Wallace on 07/16/21 at  3:00 PM EST by telephone and verified that I am speaking with the correct person using two identifiers. ? ?Location: ?Patient: home ?Provider: office ?  ?I discussed the limitations, risks, security and privacy concerns of performing an evaluation and management service by telephone and the availability of in person appointments. I also discussed with the patient that there may be a patient responsible charge related to this service. The patient expressed understanding and agreed to proceed. ?  ?I discussed the assessment and treatment plan with the patient. The patient was provided an opportunity to ask questions and all were answered. The patient agreed with the plan and demonstrated an understanding of the instructions. ?  ?The patient was advised to call back or seek an in-person evaluation if the symptoms worsen or if the condition fails to improve as anticipated. ? ?I provided 40 minutes of non-face-to-face time during this encounter.  ?Summary: Pt describes overall mood as "calm" and rates it as 7/10(10=exceptionally well). Pt says she is dating someone new and states this individual is attentive to her needs. Pt states work is demanding but manageable. 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 Pt denies any paranoia, suicidal thoughts or hallucinations. Per pt report she has started engaging in exercise and says she plans to make going for daily walks apart of her self care routine. ? ?Suicidal/Homicidal:  Pt denies SI/HI no plan, intent or attempt to harm self or others reported. ? ?Therapist Response: Assessed for changes in mood, behavior and daily functioning. Discussed self care tips to assist with reducing stress such as making self care a priority and setting boundaries to protect her self care. Processed boundary setting in personal and professional relationships. Probed for feedback on unhealthy activities that do not count as self care such as unhealthy relationships and excessive alcohol use. ? ?Plan: Return again in 2 weeks. ? ?Diagnosis: Bipolar II disorder ?                                    Anxiety ?                                    PTSD ? ?Collaboration of Care: Other none reported ? ?Patient/Guardian was advised Release of Information must be obtained prior to any record release in order to collaborate their care with an outside provider. Patient/Guardian was advised if they have not already done so to contact the registration department to sign all necessary forms in order for [place] to release information regarding their care.  ? ?Consent: Patient/Guardian gives verbal consent for treatment and assignment of benefits for services provided during this visit. Patient/Guardian expressed understanding and agreed to proceed.  ? ?Yvette Rack, LCSW ?07/16/2021 ? ?

## 2021-07-30 ENCOUNTER — Ambulatory Visit (INDEPENDENT_AMBULATORY_CARE_PROVIDER_SITE_OTHER): Payer: Self-pay | Admitting: Clinical

## 2021-07-30 ENCOUNTER — Other Ambulatory Visit: Payer: Self-pay

## 2021-07-30 DIAGNOSIS — F419 Anxiety disorder, unspecified: Secondary | ICD-10-CM

## 2021-07-30 DIAGNOSIS — F4310 Post-traumatic stress disorder, unspecified: Secondary | ICD-10-CM

## 2021-07-30 DIAGNOSIS — F3181 Bipolar II disorder: Secondary | ICD-10-CM

## 2021-07-30 NOTE — Progress Notes (Signed)
? ?  THERAPIST PROGRESS NOTE ? ?Session Time: 3pm ? ?Participation Level: Active ? ?Behavioral Response: Alert"up and down" ? ?Type of Therapy: Individual Therapy ? ?Treatment Goals addressed: Coping strategies for mood management ? ?ProgressTowards Goals: Progressing ? ?Interventions: CBT ? ?Summary:  Pt describes her mood as "up and down" and says it stems from a recent work place event. Pt discussed not feeling supported by her Freight forwarder. Pt states work is busy but manageable. Pt says she is dating someone of a different race and age group. Pt says she is enjoying the time they spend together but states keeping her distance to avoid being disappointed. Pt acknowledges and admits sabotaging past relationships for fear of disappointment.  ? ?Suicidal/Homicidal: Pt denies SI/HI no plan, intent or attempt to harm self or others reported. ? ? ?Therapist Response: actively listened as pt identified expectations in a relationship. Prompted pt to identify what she values in a partner and relationship. Addressed self sabotaging tendencies and listened as pt discussed assuming the worst will occur in a relationship. Discussed with pt putting her thoughts on trial to challenge distorted thinking. Assisted pt in identifying traits of a healthy relationship and realistic vs unrealistic expectations in a relationship. ? ?Plan: Return again in 2 weeks. ? ?Diagnosis: Bipolar II disorder ?                                    Anxiety ?                                    PTSD ?Collaboration of Care: Other none requested ? ?Patient/Guardian was advised Release of Information must be obtained prior to any record release in order to collaborate their care with an outside provider. Patient/Guardian was advised if they have not already done so to contact the registration department to sign all necessary forms in order for [place] to release information regarding their care.  ? ?Consent: Patient/Guardian gives verbal consent for treatment and  assignment of benefits for services provided during this visit. Patient/Guardian expressed understanding and agreed to proceed.  ? ?Tuere Nwosu Lynelle Smoke, LCSW ?07/30/2021 ? ?

## 2021-08-06 ENCOUNTER — Ambulatory Visit (HOSPITAL_COMMUNITY): Payer: Self-pay | Admitting: Clinical

## 2021-08-16 ENCOUNTER — Ambulatory Visit (INDEPENDENT_AMBULATORY_CARE_PROVIDER_SITE_OTHER): Payer: Self-pay | Admitting: Clinical

## 2021-08-16 DIAGNOSIS — F4310 Post-traumatic stress disorder, unspecified: Secondary | ICD-10-CM

## 2021-08-16 DIAGNOSIS — F3181 Bipolar II disorder: Secondary | ICD-10-CM

## 2021-08-16 DIAGNOSIS — F419 Anxiety disorder, unspecified: Secondary | ICD-10-CM

## 2021-08-16 NOTE — Progress Notes (Signed)
? ?  THERAPIST PROGRESS NOTE ? ?Session Time: 4pm ? ?Participation Level: Active ? ?Behavioral Response: AlertEuthymic ? ?Type of Therapy: Individual Therapy ? ?Treatment Goals addressed: healthy coping ? ?ProgressTowards Goals: Progressing ? ?Interventions: Supportive ?Virtual Visit via Telephone Note ? ?I connected with Chaney Born on 08/16/21 at  4:00 PM EDT by telephone and verified that I am speaking with the correct person using two identifiers. ? ?Location: ?Patient: home ?Provider: office ?  ?I discussed the limitations, risks, security and privacy concerns of performing an evaluation and management service by telephone and the availability of in person appointments. I also discussed with the patient that there may be a patient responsible charge related to this service. The patient expressed understanding and agreed to proceed. ?  ?I discussed the assessment and treatment plan with the patient. The patient was provided an opportunity to ask questions and all were answered. The patient agreed with the plan and demonstrated an understanding of the instructions. ?  ?The patient was advised to call back or seek an in-person evaluation if the symptoms worsen or if the condition fails to improve as anticipated. ? ?I provided 45 minutes of non-face-to-face time during this encounter.  ?Summary: Probation officer informed pt she will be leaving agency on 4/21; discussed referring her to a new clinician. Discussed referral process and addressed questions. Supported pt as she discussed work related stressors. Pt reports she is creating work place boundaries consisting of utilizing her paid time off more frequently, ending her work day at 430pm. Additionally pt states she is planning to start attending spin classes again at the [employer]. ?Suicidal/Homicidal: pt denies SI/HI no plan intent or attempt to harm self or others reported. No psychotic sxs reported. ? ?Plan: Return again in 1 weeks. ? ?Diagnosis: Bipolar II  diorder ?  PTSD ?  Anxiety ? ?Collaboration of Care: Other none requested ? ?Patient/Guardian was advised Release of Information must be obtained prior to any record release in order to collaborate their care with an outside provider. Patient/Guardian was advised if they have not already done so to contact the registration department to sign all necessary forms in order for [place] to release information regarding their care.  ? ?Consent: Patient/Guardian gives verbal consent for treatment and assignment of benefits for services provided during this visit. Patient/Guardian expressed understanding and agreed to proceed.  ? ?Yvette Rack, LCSW ?08/16/2021 ? ?

## 2021-08-21 ENCOUNTER — Ambulatory Visit (INDEPENDENT_AMBULATORY_CARE_PROVIDER_SITE_OTHER): Payer: Self-pay | Admitting: Clinical

## 2021-08-21 DIAGNOSIS — F4310 Post-traumatic stress disorder, unspecified: Secondary | ICD-10-CM

## 2021-08-21 DIAGNOSIS — F3181 Bipolar II disorder: Secondary | ICD-10-CM

## 2021-08-21 DIAGNOSIS — F419 Anxiety disorder, unspecified: Secondary | ICD-10-CM

## 2021-08-21 NOTE — Progress Notes (Signed)
? ?  THERAPIST PROGRESS NOTE ? ?Session Time: 10am ? ?Participation Level: Active ? ?Behavioral Response: Alertpleasant ? ?Type of Therapy: Individual Therapy ? ?Treatment Goals addressed: healthy coping ? ?ProgressTowards Goals: Progressing ? ?Interventions: Supportive ?Virtual Visit via Telephone Note ? ?I connected with Chaney Born on 08/21/21 at 10:00 AM EDT by telephone and verified that I am speaking with the correct person using two identifiers. ? ?Location: ?Patient: home ?Provider: office ?  ?I discussed the limitations, risks, security and privacy concerns of performing an evaluation and management service by telephone and the availability of in person appointments. I also discussed with the patient that there may be a patient responsible charge related to this service. The patient expressed understanding and agreed to proceed. ?  ?I discussed the assessment and treatment plan with the patient. The patient was provided an opportunity to ask questions and all were answered. The patient agreed with the plan and demonstrated an understanding of the instructions. ?  ?The patient was advised to call back or seek an in-person evaluation if the symptoms worsen or if the condition fails to improve as anticipated. ? ?I provided 45 minutes of non-face-to-face time during this encounter.  ?Summary: final session conducted; discussed referral process and addressed questions. Actively listened as pt discussed her survival tactics in a relationship. Pt admits entering relationship with worst case scenario thoughts and unwillingness to show emotion. Pt states she has been hurt in the past when she has shown emotion and reports being uncomfortable with doing so. Processed with pt traits of healthy and unhealthy relationships. Discussed healthy coping methods to mitigate uncomfortable emotions and thoughts. ?Suicidal/Homicidal: Pt denies SI/HI no plan intent or attempt to harm self or others reported. No psychotic sxs  reported. ? ?Diagnosis: Bipolar II diorder ?                        PTSD ?                        Anxiety ? ?Collaboration of Care: Other none requested ? ?Patient/Guardian was advised Release of Information must be obtained prior to any record release in order to collaborate their care with an outside provider. Patient/Guardian was advised if they have not already done so to contact the registration department to sign all necessary forms in order for [place] to release information regarding their care.  ? ?Consent: Patient/Guardian gives verbal consent for treatment and assignment of benefits for services provided during this visit. Patient/Guardian expressed understanding and agreed to proceed.  ? ?Caldonia Leap Lynelle Smoke, LCSW ?08/21/2021 ? ?

## 2021-10-09 ENCOUNTER — Telehealth (HOSPITAL_COMMUNITY): Payer: Self-pay | Admitting: Psychiatry

## 2021-10-16 ENCOUNTER — Telehealth (HOSPITAL_COMMUNITY): Payer: Self-pay | Admitting: *Deleted

## 2021-10-16 NOTE — Telephone Encounter (Signed)
Pt called requesting a letter supporting her having an emotional support animal. Please review and advise.

## 2021-10-16 NOTE — Telephone Encounter (Signed)
Ok to do letter

## 2021-10-17 ENCOUNTER — Telehealth (HOSPITAL_COMMUNITY): Payer: Self-pay | Admitting: *Deleted

## 2021-10-17 ENCOUNTER — Encounter (HOSPITAL_COMMUNITY): Payer: Self-pay | Admitting: *Deleted

## 2021-10-17 NOTE — Telephone Encounter (Signed)
Erroneous encounter

## 2021-10-17 NOTE — Telephone Encounter (Signed)
Emotional support animal letter completed and will be sent to pt tomorrow as mail runs Tuesdays and Thursdays.

## 2021-10-29 ENCOUNTER — Telehealth (HOSPITAL_BASED_OUTPATIENT_CLINIC_OR_DEPARTMENT_OTHER): Payer: 59 | Admitting: Psychiatry

## 2021-10-29 ENCOUNTER — Encounter (HOSPITAL_COMMUNITY): Payer: Self-pay | Admitting: Psychiatry

## 2021-10-29 VITALS — Wt 168.0 lb

## 2021-10-29 DIAGNOSIS — F419 Anxiety disorder, unspecified: Secondary | ICD-10-CM

## 2021-10-29 DIAGNOSIS — F3181 Bipolar II disorder: Secondary | ICD-10-CM

## 2021-10-29 DIAGNOSIS — F4310 Post-traumatic stress disorder, unspecified: Secondary | ICD-10-CM

## 2021-10-29 MED ORDER — TRAZODONE HCL 50 MG PO TABS: 50.0000 mg | ORAL_TABLET | Freq: Every evening | ORAL | 0 refills | Status: DC | PRN

## 2021-10-29 MED ORDER — TOPIRAMATE 100 MG PO TABS: ORAL_TABLET | ORAL | 0 refills | Status: DC

## 2021-10-29 MED ORDER — CITALOPRAM HYDROBROMIDE 40 MG PO TABS: ORAL_TABLET | ORAL | 0 refills | Status: DC
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
## 2022-01-24 ENCOUNTER — Other Ambulatory Visit (HOSPITAL_COMMUNITY): Payer: Self-pay | Admitting: *Deleted

## 2022-01-24 ENCOUNTER — Encounter (HOSPITAL_COMMUNITY): Payer: Self-pay | Admitting: Psychiatry

## 2022-01-24 ENCOUNTER — Telehealth (HOSPITAL_BASED_OUTPATIENT_CLINIC_OR_DEPARTMENT_OTHER): Payer: Self-pay | Admitting: Psychiatry

## 2022-01-24 DIAGNOSIS — F4310 Post-traumatic stress disorder, unspecified: Secondary | ICD-10-CM

## 2022-01-24 DIAGNOSIS — F419 Anxiety disorder, unspecified: Secondary | ICD-10-CM

## 2022-01-24 DIAGNOSIS — F3181 Bipolar II disorder: Secondary | ICD-10-CM

## 2022-01-24 DIAGNOSIS — Z79899 Other long term (current) drug therapy: Secondary | ICD-10-CM

## 2022-01-24 MED ORDER — TRAZODONE HCL 50 MG PO TABS: 50.0000 mg | ORAL_TABLET | Freq: Every evening | ORAL | 0 refills | Status: DC | PRN

## 2022-01-24 MED ORDER — TOPIRAMATE 100 MG PO TABS: ORAL_TABLET | ORAL | 0 refills | Status: DC

## 2022-01-24 MED ORDER — CITALOPRAM HYDROBROMIDE 40 MG PO TABS: ORAL_TABLET | ORAL | 0 refills | Status: DC
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
## 2022-04-25 ENCOUNTER — Telehealth (HOSPITAL_COMMUNITY): Payer: Self-pay | Admitting: Psychiatry

## 2022-04-30 ENCOUNTER — Encounter (HOSPITAL_COMMUNITY): Payer: Self-pay | Admitting: Psychiatry

## 2022-04-30 ENCOUNTER — Telehealth (HOSPITAL_BASED_OUTPATIENT_CLINIC_OR_DEPARTMENT_OTHER): Payer: Self-pay | Admitting: Psychiatry

## 2022-04-30 DIAGNOSIS — F419 Anxiety disorder, unspecified: Secondary | ICD-10-CM

## 2022-04-30 DIAGNOSIS — F3181 Bipolar II disorder: Secondary | ICD-10-CM

## 2022-04-30 DIAGNOSIS — F4310 Post-traumatic stress disorder, unspecified: Secondary | ICD-10-CM

## 2022-04-30 MED ORDER — TRAZODONE HCL 50 MG PO TABS: 50.0000 mg | ORAL_TABLET | Freq: Every evening | ORAL | 0 refills | Status: DC | PRN

## 2022-04-30 MED ORDER — TOPIRAMATE 100 MG PO TABS: ORAL_TABLET | ORAL | 0 refills | Status: DC

## 2022-04-30 MED ORDER — CITALOPRAM HYDROBROMIDE 40 MG PO TABS: ORAL_TABLET | ORAL | 0 refills | Status: DC
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
## 2022-07-30 ENCOUNTER — Telehealth (HOSPITAL_BASED_OUTPATIENT_CLINIC_OR_DEPARTMENT_OTHER): Payer: Self-pay | Admitting: Psychiatry

## 2022-07-30 ENCOUNTER — Encounter (HOSPITAL_COMMUNITY): Payer: Self-pay | Admitting: Psychiatry

## 2022-07-30 DIAGNOSIS — F4310 Post-traumatic stress disorder, unspecified: Secondary | ICD-10-CM

## 2022-07-30 DIAGNOSIS — F419 Anxiety disorder, unspecified: Secondary | ICD-10-CM

## 2022-07-30 DIAGNOSIS — F3181 Bipolar II disorder: Secondary | ICD-10-CM

## 2022-07-30 MED ORDER — TRAZODONE HCL 50 MG PO TABS: 75.0000 mg | ORAL_TABLET | Freq: Every evening | ORAL | 0 refills | Status: DC | PRN

## 2022-07-30 MED ORDER — CITALOPRAM HYDROBROMIDE 40 MG PO TABS: ORAL_TABLET | ORAL | 0 refills | Status: DC

## 2022-07-30 MED ORDER — TOPIRAMATE 100 MG PO TABS: ORAL_TABLET | ORAL | 0 refills | Status: DC
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
## 2022-09-12 ENCOUNTER — Encounter (HOSPITAL_COMMUNITY): Payer: Self-pay | Admitting: Psychiatry

## 2022-09-12 ENCOUNTER — Ambulatory Visit (HOSPITAL_BASED_OUTPATIENT_CLINIC_OR_DEPARTMENT_OTHER): Payer: BC Managed Care – PPO | Admitting: Psychiatry

## 2022-09-12 DIAGNOSIS — F4310 Post-traumatic stress disorder, unspecified: Secondary | ICD-10-CM

## 2022-09-12 DIAGNOSIS — F419 Anxiety disorder, unspecified: Secondary | ICD-10-CM

## 2022-09-12 DIAGNOSIS — F3181 Bipolar II disorder: Secondary | ICD-10-CM | POA: Diagnosis not present

## 2022-09-12 MED ORDER — CITALOPRAM HYDROBROMIDE 40 MG PO TABS: ORAL_TABLET | ORAL | 0 refills | Status: DC

## 2022-09-12 MED ORDER — TRAZODONE HCL 50 MG PO TABS: 75.0000 mg | ORAL_TABLET | Freq: Every evening | ORAL | 0 refills | Status: DC | PRN

## 2022-09-12 MED ORDER — TOPIRAMATE 100 MG PO TABS: ORAL_TABLET | ORAL | 0 refills | Status: DC
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
## 2022-12-12 ENCOUNTER — Ambulatory Visit (HOSPITAL_COMMUNITY): Payer: BC Managed Care – PPO | Admitting: Psychiatry

## 2022-12-12 ENCOUNTER — Encounter (HOSPITAL_COMMUNITY): Payer: Self-pay

## 2022-12-12 ENCOUNTER — Telehealth (HOSPITAL_COMMUNITY): Payer: Self-pay | Admitting: Psychiatry

## 2022-12-12 NOTE — Telephone Encounter (Signed)
Patient is requesting a refill of  hydrOXYzine (VISTARIL) 25 MG capsule sent to  [facility] [address], [place] [address] Phone: [phone]  Fax: [phone]

## 2022-12-17 ENCOUNTER — Other Ambulatory Visit (HOSPITAL_COMMUNITY): Payer: Self-pay

## 2022-12-17 ENCOUNTER — Telehealth (HOSPITAL_COMMUNITY): Payer: Self-pay | Admitting: Psychiatry

## 2022-12-17 DIAGNOSIS — F419 Anxiety disorder, unspecified: Secondary | ICD-10-CM

## 2022-12-17 MED ORDER — HYDROXYZINE PAMOATE 25 MG PO CAPS: ORAL_CAPSULE | ORAL | 0 refills | Status: DC

## 2023-01-28 ENCOUNTER — Encounter (HOSPITAL_COMMUNITY): Payer: Self-pay | Admitting: Psychiatry

## 2023-01-28 ENCOUNTER — Telehealth (HOSPITAL_BASED_OUTPATIENT_CLINIC_OR_DEPARTMENT_OTHER): Payer: BC Managed Care – PPO | Admitting: Psychiatry

## 2023-01-28 VITALS — Wt 160.0 lb

## 2023-01-28 DIAGNOSIS — F3181 Bipolar II disorder: Secondary | ICD-10-CM

## 2023-01-28 DIAGNOSIS — F4310 Post-traumatic stress disorder, unspecified: Secondary | ICD-10-CM

## 2023-01-28 DIAGNOSIS — F419 Anxiety disorder, unspecified: Secondary | ICD-10-CM | POA: Diagnosis not present

## 2023-01-28 MED ORDER — CITALOPRAM HYDROBROMIDE 40 MG PO TABS: ORAL_TABLET | ORAL | 0 refills | Status: DC

## 2023-01-28 MED ORDER — TRAZODONE HCL 50 MG PO TABS: 50.0000 mg | ORAL_TABLET | Freq: Every evening | ORAL | 0 refills | Status: DC | PRN

## 2023-01-28 MED ORDER — TOPIRAMATE 100 MG PO TABS: ORAL_TABLET | ORAL | 0 refills | Status: DC

## 2023-01-28 MED ORDER — HYDROXYZINE PAMOATE 25 MG PO CAPS: ORAL_CAPSULE | ORAL | 0 refills | Status: DC
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
## 2023-02-27 ENCOUNTER — Telehealth (HOSPITAL_COMMUNITY): Payer: BC Managed Care – PPO | Admitting: Psychiatry

## 2023-02-27 ENCOUNTER — Encounter (HOSPITAL_COMMUNITY): Payer: Self-pay | Admitting: Psychiatry

## 2023-02-27 ENCOUNTER — Other Ambulatory Visit (HOSPITAL_COMMUNITY): Payer: Self-pay | Admitting: *Deleted

## 2023-02-27 VITALS — Wt 160.0 lb

## 2023-02-27 DIAGNOSIS — F3181 Bipolar II disorder: Secondary | ICD-10-CM

## 2023-02-27 DIAGNOSIS — F419 Anxiety disorder, unspecified: Secondary | ICD-10-CM | POA: Diagnosis not present

## 2023-02-27 DIAGNOSIS — F4310 Post-traumatic stress disorder, unspecified: Secondary | ICD-10-CM

## 2023-02-27 MED ORDER — TOPIRAMATE 50 MG PO TABS: 150.0000 mg | ORAL_TABLET | Freq: Every day | ORAL | 1 refills | Status: DC

## 2023-02-27 MED ORDER — HYDROXYZINE PAMOATE 25 MG PO CAPS: ORAL_CAPSULE | ORAL | 1 refills | Status: DC
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
## 2023-03-06 ENCOUNTER — Encounter (HOSPITAL_COMMUNITY): Payer: Self-pay

## 2023-03-07 ENCOUNTER — Ambulatory Visit (HOSPITAL_COMMUNITY): Payer: BC Managed Care – PPO | Admitting: Licensed Clinical Social Worker

## 2023-03-31 ENCOUNTER — Ambulatory Visit (INDEPENDENT_AMBULATORY_CARE_PROVIDER_SITE_OTHER): Payer: BC Managed Care – PPO | Admitting: Licensed Clinical Social Worker

## 2023-03-31 ENCOUNTER — Encounter (HOSPITAL_COMMUNITY): Payer: Self-pay

## 2023-03-31 DIAGNOSIS — F3181 Bipolar II disorder: Secondary | ICD-10-CM | POA: Diagnosis not present

## 2023-03-31 DIAGNOSIS — F4310 Post-traumatic stress disorder, unspecified: Secondary | ICD-10-CM | POA: Insufficient documentation

## 2023-03-31 DIAGNOSIS — F419 Anxiety disorder, unspecified: Secondary | ICD-10-CM | POA: Diagnosis not present
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
## 2023-04-08 ENCOUNTER — Telehealth (HOSPITAL_COMMUNITY): Payer: BC Managed Care – PPO | Admitting: Psychiatry

## 2023-04-08 ENCOUNTER — Encounter (HOSPITAL_COMMUNITY): Payer: Self-pay | Admitting: Psychiatry

## 2023-04-08 VITALS — Wt 160.0 lb

## 2023-04-08 DIAGNOSIS — F4310 Post-traumatic stress disorder, unspecified: Secondary | ICD-10-CM | POA: Diagnosis not present

## 2023-04-08 DIAGNOSIS — F3181 Bipolar II disorder: Secondary | ICD-10-CM | POA: Diagnosis not present

## 2023-04-08 DIAGNOSIS — F419 Anxiety disorder, unspecified: Secondary | ICD-10-CM

## 2023-04-08 MED ORDER — TRAZODONE HCL 50 MG PO TABS
50.0000 mg | ORAL_TABLET | Freq: Every evening | ORAL | 0 refills | Status: DC | PRN
Start: 2023-04-08 — End: 2023-08-21

## 2023-04-08 MED ORDER — TOPIRAMATE 50 MG PO TABS
150.0000 mg | ORAL_TABLET | Freq: Every day | ORAL | 2 refills | Status: DC
Start: 2023-04-08 — End: 2023-07-07

## 2023-04-08 MED ORDER — HYDROXYZINE PAMOATE 25 MG PO CAPS
ORAL_CAPSULE | ORAL | 1 refills | Status: DC
Start: 2023-04-08 — End: 2024-02-19

## 2023-04-08 MED ORDER — CITALOPRAM HYDROBROMIDE 40 MG PO TABS
ORAL_TABLET | ORAL | 0 refills | Status: DC
Start: 2023-04-08 — End: 2023-07-28
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
## 2023-04-17 ENCOUNTER — Ambulatory Visit (HOSPITAL_COMMUNITY): Payer: BC Managed Care – PPO | Admitting: Licensed Clinical Social Worker

## 2023-04-17 DIAGNOSIS — F4310 Post-traumatic stress disorder, unspecified: Secondary | ICD-10-CM

## 2023-04-17 DIAGNOSIS — F3181 Bipolar II disorder: Secondary | ICD-10-CM | POA: Diagnosis not present

## 2023-04-17 NOTE — Progress Notes (Signed)
THERAPIST PROGRESS NOTE   Session Date: 04/17/2023  Session Time: 1507 - 1610  Virtual Visit via Video Note  I connected with Nicole Wallace on 04/17/23 at  3:07 PM EST by a video enabled telemedicine application and verified that I am speaking with the correct person using two identifiers.  Location: Patient: [employer]anner: [facility]   I discussed the limitations of evaluation and management by telemedicine and the availability of in person appointments. The patient expressed understanding and agreed to proceed.   The patient was advised to call back or seek an in-person evaluation if the symptoms worsen or if the condition fails to improve as anticipated.  I provided 63 minutes of non-face-to-face time during this encounter.  Participation Level: Active  Behavioral Response: Neat and Well GroomedAlertEuthymic  Type of Therapy: Individual Therapy  Treatment Goals addressed:  - LTG: Reduce frequency, intensity, and duration of depression symptoms so that daily functioning is improved (OP Depression) - LTG: Increase coping skills to manage depression and improve ability to perform daily activities (OP Depression) - STG: Nicole Wallace will identify cognitive patterns and beliefs that support depression (OP Depression) - LTG: "Be able to talk through experiences without having an increased emotional response" (OP Depression) - LTG: Elimination of maladaptive behaviors and thinking patterns which interfere with resolution of trauma as evidenced by a reduction in avoidant behaviors. (BH CCP Acute or Chronic Trauma Reaction) - LTG: Recall traumatic events without becoming overwhelmed with negative emotions ([facility] CCP Acute or Chronic Trauma Reaction)  ProgressTowards Goals: Progressing  Interventions: CBT, Motivational Interviewing, and Supportive  Summary: Nicole Wallace is a 42 y.o. female with past psych history of Bipolar 2, and PTSD, presenting for follow-up therapy session in  efforts to improve management of MH symptoms and stressors. Patient actively engaged in session, providing recounts of the past two weeks since intake apt, sharing of time spent with family for the holiday, as well as having recently begun family therapy with sister in efforts to navigate challenges within relationship. Pt detailed of additional stressors presenting in family therapy sessions and continued conflict. Further engaged in exploration of hx of relationship and family dynamics throughout childhood, adolescence, and into adulthood that prove to be factors in the nature of the current relationship with sister. Patient responded well to interventions. Patient continues to meet criteria for Bipolar 2 and PTSD. Patient will continue to benefit from engagement in outpatient therapy due to being the least restrictive service to meet presenting needs.     04/17/2023    4:03 PM 03/31/2023   11:24 AM  GAD 7 : Generalized Anxiety Score  Nervous, Anxious, on Edge 2 1  Control/stop worrying 0 0  Worry too much - different things 0 0  Trouble relaxing 2 0  Restless 2 0  Easily annoyed or irritable 1 3  Afraid - awful might happen 0 0  Total GAD 7 Score 7 4  Anxiety Difficulty Not difficult at all Not difficult at all      04/17/2023    4:07 PM 03/31/2023   11:26 AM 07/04/2017    2:59 PM 07/04/2017    2:13 PM 10/19/2016    8:16 AM  Depression screen PHQ 2/9  Decreased Interest 2 1     Down, Depressed, Hopeless 0 0     PHQ - 2 Score 2 1     Altered sleeping 0      Tired, decreased energy 2      Change in appetite 0  Feeling bad or failure about yourself  0      Trouble concentrating 2      Moving slowly or fidgety/restless 0      Suicidal thoughts 0      PHQ-9 Score 6      Difficult doing work/chores Not difficult at all         Information is confidential and restricted. Go to Review Flowsheets to unlock data.    Suicidal/Homicidal: No  Therapist Response: Clinician utilized CBT,  MI, and supportive reflection techniques to address and support pt in navigating presenting stressors and sxs. Clinician engaged pt in exploration of hx of family system, and relationship between she and sister throughout childhood in exploration of various factors contributing to current nature of relationship. Therapist provided support and empathy to patient during session.  Plan: Return again in 1 weeks.  Diagnosis:  Encounter Diagnoses  Name Primary?   Bipolar 2 disorder (HCC) Yes   PTSD (post-traumatic stress disorder)     Collaboration of Care: Other None necessary at this time.  Patient/Guardian was advised Release of Information must be obtained prior to any record release in order to collaborate their care with an outside provider. Patient/Guardian was advised if they have not already done so to contact the registration department to sign all necessary forms in order for [place] to release information regarding their care.   Consent: Patient/Guardian gives verbal consent for treatment and assignment of benefits for services provided during this visit. Patient/Guardian expressed understanding and agreed to proceed.   Leisa Lenz, MSW, LCSW 04/17/2023,  3:16 PM

## 2023-04-21 ENCOUNTER — Ambulatory Visit (INDEPENDENT_AMBULATORY_CARE_PROVIDER_SITE_OTHER): Payer: BC Managed Care – PPO | Admitting: Licensed Clinical Social Worker

## 2023-04-21 DIAGNOSIS — F3181 Bipolar II disorder: Secondary | ICD-10-CM | POA: Diagnosis not present

## 2023-04-21 NOTE — Progress Notes (Signed)
THERAPIST PROGRESS NOTE   Session Date: 04/21/2023  Session Time: 1101 - 1154  Virtual Visit via Video Note  I connected with Nicole Wallace on 04/21/23 at  11:01 AM EST by a video enabled telemedicine application and verified that I am speaking with the correct person using two identifiers.  Location: Patient: [employer] account planner: [facility]   I discussed the limitations of evaluation and management by telemedicine and the availability of in person appointments. The patient expressed understanding and agreed to proceed.   The patient was advised to call back or seek an in-person evaluation if the symptoms worsen or if the condition fails to improve as anticipated.  I provided 53 minutes of non-face-to-face time during this encounter.  Participation Level: Active  Behavioral Response: Neat and Well GroomedAlertEuthymic  Type of Therapy: Individual Therapy  Treatment Goals addressed:  - LTG: Reduce frequency, intensity, and duration of depression symptoms so that daily functioning is improved (OP Depression) - LTG: Increase coping skills to manage depression and improve ability to perform daily activities (OP Depression) - STG: Nicole Wallace will identify cognitive patterns and beliefs that support depression (OP Depression) - LTG: "Be able to talk through experiences without having an increased emotional response" (OP Depression) - LTG: Elimination of maladaptive behaviors and thinking patterns which interfere with resolution of trauma as evidenced by a reduction in avoidant behaviors. (BH CCP Acute or Chronic Trauma Reaction) - LTG: Recall traumatic events without becoming overwhelmed with negative emotions (BH CCP Acute or Chronic Trauma Reaction)  ProgressTowards Goals: Progressing  Interventions: CBT, Motivational Interviewing, and Supportive  Summary: Nicole Wallace is a 42 y.o. female with past psych history of Bipolar 2, and PTSD, presenting for follow-up therapy session in  efforts to improve management of MH symptoms and stressors. Patient actively engaged in session, providing recounts of the past weekend and connecting with ex-husband earlier today, whom pt has been separated from for 12 years, in aims of navigating relationship. Pt provided further hx of relationship, ranging from dating, marriage, and separation, as well as various factors that proved to impact the nature of the relationship and overall decline. Revisited relationship with sister, and further explored pt perspective, thoughts and feelings, and pt's feelings surrounding overall nature of sibling relationship and sisters expressed interested in distancing self from pt and pt's father. Patient responded well to interventions. Patient continues to meet criteria for Bipolar 2 and PTSD. Patient will continue to benefit from engagement in outpatient therapy due to being the least restrictive service to meet presenting needs.      04/17/2023    4:03 PM 03/31/2023   11:24 AM  GAD 7 : Generalized Anxiety Score  Nervous, Anxious, on Edge 2 1  Control/stop worrying 0 0  Worry too much - different things 0 0  Trouble relaxing 2 0  Restless 2 0  Easily annoyed or irritable 1 3  Afraid - awful might happen 0 0  Total GAD 7 Score 7 4  Anxiety Difficulty Not difficult at all Not difficult at all      04/17/2023    4:07 PM 03/31/2023   11:26 AM 07/04/2017    2:59 PM 07/04/2017    2:13 PM 10/19/2016    8:16 AM  Depression screen PHQ 2/9  Decreased Interest 2 1     Down, Depressed, Hopeless 0 0     PHQ - 2 Score 2 1     Altered sleeping 0      Tired, decreased energy 2  Change in appetite 0      Feeling bad or failure about yourself  0      Trouble concentrating 2      Moving slowly or fidgety/restless 0      Suicidal thoughts 0      PHQ-9 Score 6      Difficult doing work/chores Not difficult at all         Information is confidential and restricted. Go to Review Flowsheets to unlock data.     Suicidal/Homicidal: No  Therapist Response: Clinician utilized CBT, MI, and supportive reflection techniques to address and support pt in navigating presenting stressors and sxs. Evoked pt's perspective on past relationship and challenges within past marriage, exploring thoughts and feelings surrounding hx of events and separation. Clinician engaged pt in continued exploration of hx of family system, relationship between she and sister, and further elicited pt's perspective on varying factors that have and may continue to impact relationship. Therapist provided support and empathy to patient during session.  Plan: Return again in 1 weeks.  Diagnosis:  Encounter Diagnosis  Name Primary?   Bipolar 2 disorder (HCC) Yes     Collaboration of Care: Other None necessary at this time.  Patient/Guardian was advised Release of Information must be obtained prior to any record release in order to collaborate their care with an outside provider. Patient/Guardian was advised if they have not already done so to contact the registration department to sign all necessary forms in order for [place] to release information regarding their care.   Consent: Patient/Guardian gives verbal consent for treatment and assignment of benefits for services provided during this visit. Patient/Guardian expressed understanding and agreed to proceed.   Leisa Lenz, MSW, LCSW 04/21/2023,  1:05 PM

## 2023-04-24 ENCOUNTER — Ambulatory Visit (HOSPITAL_COMMUNITY): Payer: BC Managed Care – PPO | Admitting: Licensed Clinical Social Worker

## 2023-04-29 ENCOUNTER — Telehealth (HOSPITAL_COMMUNITY): Payer: BC Managed Care – PPO | Admitting: Psychiatry

## 2023-05-01 ENCOUNTER — Ambulatory Visit (HOSPITAL_COMMUNITY): Payer: BC Managed Care – PPO | Admitting: Licensed Clinical Social Worker

## 2023-05-01 DIAGNOSIS — F3181 Bipolar II disorder: Secondary | ICD-10-CM | POA: Diagnosis not present
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
## 2023-05-05 ENCOUNTER — Ambulatory Visit (INDEPENDENT_AMBULATORY_CARE_PROVIDER_SITE_OTHER): Payer: BC Managed Care – PPO | Admitting: Licensed Clinical Social Worker

## 2023-05-05 DIAGNOSIS — F419 Anxiety disorder, unspecified: Secondary | ICD-10-CM

## 2023-05-05 DIAGNOSIS — F3181 Bipolar II disorder: Secondary | ICD-10-CM | POA: Diagnosis not present

## 2023-05-05 DIAGNOSIS — F4310 Post-traumatic stress disorder, unspecified: Secondary | ICD-10-CM
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
## 2023-05-12 ENCOUNTER — Ambulatory Visit (HOSPITAL_COMMUNITY): Payer: BC Managed Care – PPO | Admitting: Licensed Clinical Social Worker

## 2023-05-12 DIAGNOSIS — F419 Anxiety disorder, unspecified: Secondary | ICD-10-CM

## 2023-05-12 DIAGNOSIS — F4310 Post-traumatic stress disorder, unspecified: Secondary | ICD-10-CM | POA: Diagnosis not present

## 2023-05-12 DIAGNOSIS — F3181 Bipolar II disorder: Secondary | ICD-10-CM

## 2023-05-22 ENCOUNTER — Ambulatory Visit (INDEPENDENT_AMBULATORY_CARE_PROVIDER_SITE_OTHER): Payer: BC Managed Care – PPO | Admitting: Licensed Clinical Social Worker

## 2023-05-22 DIAGNOSIS — F4310 Post-traumatic stress disorder, unspecified: Secondary | ICD-10-CM

## 2023-05-22 DIAGNOSIS — F3181 Bipolar II disorder: Secondary | ICD-10-CM | POA: Diagnosis not present

## 2023-05-22 DIAGNOSIS — F419 Anxiety disorder, unspecified: Secondary | ICD-10-CM | POA: Diagnosis not present

## 2023-05-22 NOTE — Progress Notes (Signed)
 THERAPIST PROGRESS NOTE   Session Date: 05/22/2023  Session Time: 1503 - 1552  Virtual Visit via Video Note  I connected with Nicole Wallace on 05/22/23 at  3:00 PM EST by a video enabled telemedicine application and verified that I am speaking with the correct person using two identifiers.  Location: Patient: [employer]anner: [facility]   I discussed the limitations of evaluation and management by telemedicine and the availability of in person appointments. The patient expressed understanding and agreed to proceed.   The patient was advised to call back or seek an in-person evaluation if the symptoms worsen or if the condition fails to improve as anticipated.  I provided 48 minutes of non-face-to-face time during this encounter.  Participation Level: Active  Behavioral Response: Neat and Well GroomedAlertIrritable  Type of Therapy: Individual Therapy  Treatment Goals addressed:  - LTG: Reduce frequency, intensity, and duration of depression symptoms so that daily functioning is improved (OP Depression) - LTG: Increase coping skills to manage depression and improve ability to perform daily activities (OP Depression) - STG: Nicole Wallace will identify cognitive patterns and beliefs that support depression (OP Depression) - LTG: Be able to talk through experiences without having an increased emotional response (OP Depression) - LTG: Elimination of maladaptive behaviors and thinking patterns which interfere with resolution of trauma as evidenced by a reduction in avoidant behaviors. (BH CCP Acute or Chronic Trauma Reaction) - LTG: Recall traumatic events without becoming overwhelmed with negative emotions (BH CCP Acute or Chronic Trauma Reaction)  ProgressTowards Goals: Progressing  Interventions: CBT, Motivational Interviewing, and Supportive  Summary: Nicole Wallace is a 43 y.o. female with past psych history of Bipolar 2, PTSD, and Anxiety, presenting for follow-up therapy  session in efforts to improve management of MH symptoms and stressors. Patient actively engaged in session, presenting initially in irritable moods and affect, improving moods throughout, detailing increased stress and frustration surrounding workplace stressors.  Patient actively engaged in further exploration of challenges and workplace stressors and similarities in the challenges experienced relating to interactions with sister, nephew and mother. Patient acknowledged factors that proved to be similar in varying interactions and individuals abilities to resolve conflict or differing opinions in healthy, mature manners. Presented relationship with mother as being another stressor throughout hx, explored observations made throughout childhood, into adulthood, that create challenges within interactions.  Patient continues to meet criteria for Bipolar 2 and PTSD, and Anxiety. Patient will continue to benefit from engagement in outpatient therapy due to being the least restrictive service to meet presenting needs.      05/12/2023   11:58 AM 04/17/2023    4:03 PM 03/31/2023   11:24 AM  GAD 7 : Generalized Anxiety Score  Nervous, Anxious, on Edge 1 2 1   Control/stop worrying 0 0 0  Worry too much - different things 0 0 0  Trouble relaxing 1 2 0  Restless 1 2 0  Easily annoyed or irritable 0 1 3  Afraid - awful might happen 0 0 0  Total GAD 7 Score 3 7 4   Anxiety Difficulty Not difficult at all Not difficult at all Not difficult at all      05/12/2023   12:00 PM 04/17/2023    4:07 PM 03/31/2023   11:26 AM 07/04/2017    2:59 PM 07/04/2017    2:13 PM  Depression screen PHQ 2/9  Decreased Interest 0 2 1    Down, Depressed, Hopeless 0 0 0    PHQ - 2 Score 0  2 1    Altered sleeping 0 0     Tired, decreased energy 0 2     Change in appetite 0 0     Feeling bad or failure about yourself   0     Trouble concentrating 0 2     Moving slowly or fidgety/restless 0 0     Suicidal thoughts 0 0      PHQ-9 Score 0 6     Difficult doing work/chores Not difficult at all Not difficult at all        Information is confidential and restricted. Go to Review Flowsheets to unlock data.    Suicidal/Homicidal: No  Therapist Response: Clinician utilized CBT, MI, and supportive reflection techniques to address and support pt in navigating presenting stressors and sxs. Supported pt in reflecting on recent events and feelings surrounding returning to sense of routine following holidays. Elicited pt's perspective surrounding current feelings and emotions, proving to impact mood and increased irritability. Actively supported pt in exploring similarities in behaviors observed in the workplace that prove to be bothersome, and the parallels in alternate relationships that pt has challenges tolerating. Supported pt in reflecting further on relationship with mother and observations of mothers character and the challenges within relationship. Therapist provided support and empathy to patient during session.  Plan: Return again in 1 weeks.  Diagnosis:  Encounter Diagnoses  Name Primary?   Bipolar 2 disorder (HCC) Yes   PTSD (post-traumatic stress disorder)    Anxiety        Collaboration of Care: Other None necessary at this time.  Patient/Guardian was advised Release of Information must be obtained prior to any record release in order to collaborate their care with an outside provider. Patient/Guardian was advised if they have not already done so to contact the registration department to sign all necessary forms in order for us  to release information regarding their care.   Consent: Patient/Guardian gives verbal consent for treatment and assignment of benefits for services provided during this visit. Patient/Guardian expressed understanding and agreed to proceed.   Lynwood JONETTA Maris, MSW, LCSW 05/22/2023,  3:14 PM

## 2023-05-29 ENCOUNTER — Ambulatory Visit (HOSPITAL_COMMUNITY): Payer: BC Managed Care – PPO | Admitting: Licensed Clinical Social Worker

## 2023-05-29 DIAGNOSIS — F3181 Bipolar II disorder: Secondary | ICD-10-CM

## 2023-05-29 DIAGNOSIS — F4310 Post-traumatic stress disorder, unspecified: Secondary | ICD-10-CM

## 2023-05-29 DIAGNOSIS — F419 Anxiety disorder, unspecified: Secondary | ICD-10-CM | POA: Diagnosis not present

## 2023-05-29 NOTE — Progress Notes (Signed)
THERAPIST PROGRESS NOTE   Session Date: 05/29/2023  Session Time: 1510 - 1604  Virtual Visit via Video Note  I connected with Nicole Wallace on 05/29/23 at  3:10 PM EST by a video enabled telemedicine application and verified that I am speaking with the correct person using two identifiers.  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 The patient expressed understanding and agreed to proceed.   The patient was advised to call back or seek an in-person evaluation if the symptoms worsen or if the condition fails to improve as anticipated.  I provided 54 minutes of non-face-to-face time during this encounter.  Participation Level: Active  Behavioral Response: Neat and Well GroomedAlertIrritable  Type of Therapy: Individual Therapy  Treatment Goals addressed:  - LTG: Reduce frequency, intensity, and duration of depression symptoms so that daily functioning is improved (OP Depression) - LTG: Increase coping skills to manage depression and improve ability to perform daily activities (OP Depression) - STG: Navaya will identify cognitive patterns and beliefs that support depression (OP Depression) - LTG: "Be able to talk through experiences without having an increased emotional response" (OP Depression) - LTG: Elimination of maladaptive behaviors and thinking patterns which interfere with resolution of trauma as evidenced by a reduction in avoidant behaviors. (BH CCP Acute or Chronic Trauma Reaction) - LTG: Recall traumatic events without becoming overwhelmed with negative emotions (BH CCP Acute or Chronic Trauma Reaction)  ProgressTowards Goals: Progressing  Interventions: CBT, Motivational Interviewing, and Supportive  Summary: Nicole Wallace is a 43 y.o. female with past psych history of Bipolar 2, PTSD, and Anxiety, presenting for follow-up therapy  session in efforts to improve management of MH symptoms and stressors. Patient actively engaged in session, maintaining pleasant moods throughout, sharing of things going well over the past week since previous session.  Engaged in reassessment of depressive and anxious symptoms, further processing continued reduction in anxious symptoms with no reported symptoms over the past 2 weeks, and engaging in processing minor increase in depressive symptoms, specifically in relation to challenges sleeping and focus.  Patient reflected on identified areas of stress in previous session, detailing ways in which stress was resolved related to current workplace.  Revisited identified stressors within relationship with sister and factors that result in impacting development, further impacting behaviors into adulthood. Pt detailed recent decision to begin exploring dating options again, sharing of upcoming dates over the coming weekend.  Patient continues to meet criteria for Bipolar 2 and PTSD, and Anxiety. Patient will continue to benefit from engagement in outpatient therapy due to being the least restrictive service to meet presenting needs.      05/29/2023    3:17 PM 05/12/2023   11:58 AM 04/17/2023    4:03 PM 03/31/2023   11:24 AM  GAD 7 : Generalized Anxiety Score  Nervous, Anxious, on Edge 0 1 2 1   Control/stop worrying 0 0 0 0  Worry too much - different things 0 0 0 0  Trouble relaxing 0 1 2 0  Restless 0 1 2 0  Easily annoyed or irritable 0 0 1 3  Afraid - awful might happen 0 0 0 0  Total GAD 7 Score 0 3 7 4   Anxiety Difficulty Not difficult at all Not difficult at all Not difficult at all Not difficult at all      05/29/2023    3:19 PM 05/12/2023   12:00  PM 04/17/2023    4:07 PM 03/31/2023   11:26 AM 07/04/2017    2:59 PM  Depression screen PHQ 2/9  Decreased Interest 0 0 2 1   Down, Depressed, Hopeless 0 0 0 0   PHQ - 2 Score 0 0 2 1   Altered sleeping 2 0 0    Tired, decreased energy 0 0 2     Change in appetite 0 0 0    Feeling bad or failure about yourself  0  0    Trouble concentrating 2 0 2    Moving slowly or fidgety/restless 0 0 0    Suicidal thoughts 0 0 0    PHQ-9 Score 4 0 6    Difficult doing work/chores Not difficult at all Not difficult at all Not difficult at all       Information is confidential and restricted. Go to Review Flowsheets to unlock data.    Suicidal/Homicidal: No  Therapist Response: Clinician utilized CBT, MI, and supportive reflection techniques to address and support pt in navigating presenting stressors and sxs. Supported pt in actively reflecting on events of the past week, eliciting patient's perspectives, as well as thoughts and feelings related to resolution of previous stressors.  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  Readministered PHQ-9 and GAD-7, engaging patient in exploration of absence of anxious symptoms and minor increase of depressive symptoms related to sleep and inattentiveness.  Further evoked patient's thoughts and feelings surrounding exploring dating again, supporting patient in processing perspective and thoughts related to potential pursuance of relationships.  Clinician reassessed severity of presenting sxs, and presence of any safety concerns. Therapist provided support and empathy to patient during session.  Plan: Return again in 2 weeks.  Diagnosis:  Encounter Diagnoses  Name Primary?   Bipolar 2 disorder (HCC) Yes   PTSD (post-traumatic stress disorder)    Anxiety      Collaboration of Care: Other None necessary at this time.  Patient/Guardian was advised Release of Information must be obtained prior to any record release in order to collaborate their care with an outside provider. Patient/Guardian was advised if they have not already done so to contact the registration department to sign all  necessary forms in order for [place] to release information regarding their care.   Consent: Patient/Guardian gives verbal consent for treatment and assignment of benefits for services provided during this visit. Patient/Guardian expressed understanding and agreed to proceed.   Leisa Lenz, MSW, LCSW 05/29/2023,  3:27 PM

## 2023-06-05 ENCOUNTER — Ambulatory Visit (HOSPITAL_COMMUNITY): Payer: BC Managed Care – PPO | Admitting: Licensed Clinical Social Worker

## 2023-06-12 ENCOUNTER — Ambulatory Visit (HOSPITAL_COMMUNITY): Payer: BC Managed Care – PPO | Admitting: Licensed Clinical Social Worker

## 2023-06-12 DIAGNOSIS — F3181 Bipolar II disorder: Secondary | ICD-10-CM | POA: Diagnosis not present

## 2023-06-12 DIAGNOSIS — F419 Anxiety disorder, unspecified: Secondary | ICD-10-CM | POA: Diagnosis not present

## 2023-06-12 DIAGNOSIS — F4310 Post-traumatic stress disorder, unspecified: Secondary | ICD-10-CM | POA: Diagnosis not present

## 2023-06-12 NOTE — Progress Notes (Signed)
THERAPIST PROGRESS NOTE   Session Date: 06/12/2023  Session Time: 1505 - 1600  Virtual Visit via Video Note  I connected with Nicole Wallace on 06/12/23 at  3:05 PM EST by a video enabled telemedicine application and verified that I am speaking with the correct person using two identifiers.  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 The patient expressed understanding and agreed to proceed.   The patient was advised to call back or seek an in-person evaluation if the symptoms worsen or if the condition fails to improve as anticipated.  I provided 54 minutes of non-face-to-face time during this encounter.  Participation Level: Active  Behavioral Response: Neat and Well GroomedAlertDepressed, Euthymic, Irritable, and tearful  Type of Therapy: Individual Therapy  Treatment Goals addressed:  - LTG: Reduce frequency, intensity, and duration of depression symptoms so that daily functioning is improved (OP Depression) - LTG: Increase coping skills to manage depression and improve ability to perform daily activities (OP Depression) - STG: Nicole Wallace will identify cognitive patterns and beliefs that support depression (OP Depression) - LTG: "Be able to talk through experiences without having an increased emotional response" (OP Depression) - LTG: Elimination of maladaptive behaviors and thinking patterns which interfere with resolution of trauma as evidenced by a reduction in avoidant behaviors. (BH CCP Acute or Chronic Trauma Reaction) - LTG: Recall traumatic events without becoming overwhelmed with negative emotions (BH CCP Acute or Chronic Trauma Reaction)  ProgressTowards Goals: Progressing  Interventions: CBT, Motivational Interviewing, and Supportive  Summary: Nicole Wallace is a 43 y.o. female with past psych history of Bipolar 2, PTSD, and Anxiety,  presenting for follow-up therapy session in efforts to improve management of presenting sxs. Pt actively engaged in check-in, presenting in varying moods and congruent affect throughout, with periods of tearfulness during challenging points of discussion. Pt actively engaged in re-administering of PHQ-9 and GAD-7, processing noted variances in scores, further exploring observed sxs. Pt initially proved short and irritable in her responses, detailing events of the past two weeks, specifically noting the recent weekend being the most stressful/challenging. Pt reflected on previously discussed areas of exploration surrounding potential dating possibilities, further detailing progressions of such interactions, efforts at communication, and resolutions of efforts towards dating plans. Further engaged in processing thoughts, feelings and perspectives surrounding dating, recent efforts proving to support prior outlooks and the relationship between thoughts, feelings, and behaviors/actions. Processed pt's understanding and experiences of negative thoughts, impacting negative, depressive feelings, resulting in behaviors that align such as isolation.   Patient responded well to interventions. Patient continues to meet criteria for Bipolar 2 and PTSD, and Anxiety. Patient will continue to benefit from engagement in outpatient therapy due to being the least restrictive service to meet presenting needs.      06/12/2023    3:11 PM 05/29/2023    3:17 PM 05/12/2023   11:58 AM 04/17/2023    4:03 PM  GAD 7 : Generalized Anxiety Score  Nervous, Anxious, on Edge 0 0 1 2  Control/stop worrying 0 0 0 0  Worry too much - different things 0 0 0 0  Trouble relaxing 1 0 1 2  Restless 0 0 1 2  Easily annoyed or irritable 0 0 0 1  Afraid - awful might happen 0 0 0 0  Total GAD 7 Score 1 0 3 7  Anxiety Difficulty Somewhat difficult Not difficult at all Not  difficult at all Not difficult at all      06/12/2023    3:13 PM  05/29/2023    3:19 PM 05/12/2023   12:00 PM 04/17/2023    4:07 PM 03/31/2023   11:26 AM  Depression screen PHQ 2/9  Decreased Interest 1 0 0 2 1  Down, Depressed, Hopeless 1 0 0 0 0  PHQ - 2 Score 2 0 0 2 1  Altered sleeping 0 2 0 0   Tired, decreased energy 1 0 0 2   Change in appetite 1 0 0 0   Feeling bad or failure about yourself  0 0  0   Trouble concentrating 1 2 0 2   Moving slowly or fidgety/restless 0 0 0 0   Suicidal thoughts 0 0 0 0   PHQ-9 Score 5 4 0 6   Difficult doing work/chores Not difficult at all Not difficult at all Not difficult at all Not difficult at all     Suicidal/Homicidal: No  Therapist Response: Clinician utilized CBT, MI, and supportive reflection techniques to address and support pt in navigating presenting stressors and sxs. Actively engaged pt in check-in, assessing mood and affect. Re-administered PHQ-9 and GAD-7. Actively evoked pt's recount's of past two weeks, listening to pt's perspectives, challenging negative thoughts, and engaging pt to explore potential alternate perspectives. Supported pt in navigating understanding of cognitive distortions and prolonged implications on moods and behaviors.  Clinician reassessed severity of presenting sxs, and presence of any safety concerns. Therapist provided support and empathy to patient during session.  Plan: Return again in 2 weeks.  Diagnosis:  Encounter Diagnoses  Name Primary?   Bipolar 2 disorder (HCC) Yes   PTSD (post-traumatic stress disorder)    Anxiety     Collaboration of Care: Other None necessary at this time.  Patient/Guardian was advised Release of Information must be obtained prior to any record release in order to collaborate their care with an outside provider. Patient/Guardian was advised if they have not already done so to contact the registration department to sign all necessary forms in order for [place] to release information regarding their care.   Consent: Patient/Guardian gives  verbal consent for treatment and assignment of benefits for services provided during this visit. Patient/Guardian expressed understanding and agreed to proceed.   Leisa Lenz, MSW, LCSW 06/12/2023,  3:17 PM

## 2023-06-26 ENCOUNTER — Ambulatory Visit (HOSPITAL_COMMUNITY): Payer: BC Managed Care – PPO | Admitting: Licensed Clinical Social Worker

## 2023-06-26 DIAGNOSIS — F4310 Post-traumatic stress disorder, unspecified: Secondary | ICD-10-CM

## 2023-06-26 DIAGNOSIS — F3181 Bipolar II disorder: Secondary | ICD-10-CM

## 2023-06-26 NOTE — Progress Notes (Signed)
THERAPIST PROGRESS NOTE   Session Date: 06/26/2023  Session Time: 1504 - 1603  Virtual Visit via Video Note  I connected with Nicole Wallace on 06/26/23 at  3:04 PM EST by a video enabled telemedicine application and verified that I am speaking with the correct person using two identifiers.  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 The patient expressed understanding and agreed to proceed.   The patient was advised to call back or seek an in-person evaluation if the symptoms worsen or if the condition fails to improve as anticipated.  I provided 59 minutes of non-face-to-face time during this encounter.  Participation Level: Active  Behavioral Response: Neat and Well GroomedAlertAnxious, Depressed, Euthymic, and tearful  Type of Therapy: Individual Therapy  Treatment Goals addressed:  - LTG: Reduce frequency, intensity, and duration of depression symptoms so that daily functioning is improved (OP Depression) - LTG: Increase coping skills to manage depression and improve ability to perform daily activities (OP Depression) - STG: Nicole Wallace will identify cognitive patterns and beliefs that support depression (OP Depression) - LTG: "Be able to talk through experiences without having an increased emotional response" (OP Depression) - LTG: Elimination of maladaptive behaviors and thinking patterns which interfere with resolution of trauma as evidenced by a reduction in avoidant behaviors. (BH CCP Acute or Chronic Trauma Reaction) - LTG: Recall traumatic events without becoming overwhelmed with negative emotions (BH CCP Acute or Chronic Trauma Reaction)  ProgressTowards Goals: Progressing  Interventions: CBT, Motivational Interviewing, and Supportive  Summary: Nicole Wallace is a 43 y.o. female with past psych history of Bipolar 2, PTSD, and Anxiety,  presenting for follow-up therapy session in efforts to improve management of presenting sxs. Pt actively engaged in check-in, presenting in variable moods and congruent affect throughout, with periods of tearfulness.   - Pt actively detailed past two weeks proving to be challenging, identifying factors specifically related to feelings surrounding recent experiences and challenges in efforts at exploring dating, finding self becoming uninterested in continuing dating due to lack of alignment of values, interests, and priorities. - Engaged in re-administering of PHQ-9 and GAD-7, reflecting on current scores, observed trends in prior scores, and processing identified sxs. - Further discussed experiences and awareness of mood dysregulation in relation to bipolar, noting of having observed self experiencing manic episode 6-8 weeks ago, citing behaviors of increased impulsive spending of bonus check, increased happiness, and observed cleaning habits, comparing to current depressive sxs of isolating, not wanting to get out of bed, anhedonia, tearfulness, feeling bad about self, restlessness and increased irritability. - Began exploration of pt's understanding of relationship between thoughts, feelings, and behaviors, the impact Cognitive distortions have on perspectives and the support of irrational anxious and negative thoughts.   Patient responded well to interventions. Patient continues to meet criteria for Bipolar 2 and PTSD, and Anxiety. Patient will continue to benefit from engagement in outpatient therapy due to being the least restrictive service to meet presenting needs.      06/26/2023    3:08 PM 06/12/2023    3:11 PM 05/29/2023    3:17 PM 05/12/2023   11:58 AM  GAD 7 : Generalized Anxiety Score  Nervous, Anxious, on Edge 1 0 0 1  Control/stop worrying 1 0 0 0  Worry too much - different things 0 0 0 0  Trouble relaxing 1 1 0 1  Restless 2 0 0 1  Easily annoyed or irritable 3 0 0 0  Afraid -  awful might happen 0 0 0 0  Total GAD 7 Score 8 1 0 3  Anxiety Difficulty Not difficult at all Somewhat difficult Not difficult at all Not difficult at all      06/26/2023    3:16 PM 06/12/2023    3:13 PM 05/29/2023    3:19 PM 05/12/2023   12:00 PM 04/17/2023    4:07 PM  Depression screen PHQ 2/9  Decreased Interest 3 1 0 0 2  Down, Depressed, Hopeless 0 1 0 0 0  PHQ - 2 Score 3 2 0 0 2  Altered sleeping 0 0 2 0 0  Tired, decreased energy 1 1 0 0 2  Change in appetite 0 1 0 0 0  Feeling bad or failure about yourself  0 0 0  0  Trouble concentrating 3 1 2  0 2  Moving slowly or fidgety/restless 0 0 0 0 0  Suicidal thoughts 0 0 0 0 0  PHQ-9 Score 7 5 4  0 6  Difficult doing work/chores Very difficult Not difficult at all Not difficult at all Not difficult at all Not difficult at all    Suicidal/Homicidal: No  Therapist Response: Clinician utilized CBT, MI, and supportive reflection techniques to address and support pt in navigating presenting stressors and sxs. Actively engaged pt in check-in, assessing mood and affect. Re-administered PHQ-9 and GAD-7.  - Elicited pt's recounts of past two weeks, actively listening, utilizing socratic questioning to support pt in navigating and reflecting on thoughts, further challenging negative and irrational thoughts expressed by pt. - Supported pt in reflecting on current screening scores, prior trends, and variances in observed sxs. - Provided pt with "Cognitive Distortions" handout in aims of clarifying specific distortions and the roles they play in thought processes and perspectives.  Clinician reassessed severity of presenting sxs, and presence of any safety concerns. Therapist provided support and empathy to patient during session.  Plan: Return again in 2 weeks.  Diagnosis:  Encounter Diagnoses  Name Primary?   Bipolar 2 disorder (HCC) Yes   PTSD (post-traumatic stress disorder)      Collaboration of Care: Psychiatrist AEB provider  notes available in EHR.  Patient/Guardian was advised Release of Information must be obtained prior to any record release in order to collaborate their care with an outside provider. Patient/Guardian was advised if they have not already done so to contact the registration department to sign all necessary forms in order for [place] to release information regarding their care.   Consent: Patient/Guardian gives verbal consent for treatment and assignment of benefits for services provided during this visit. Patient/Guardian expressed understanding and agreed to proceed.   Leisa Lenz, MSW, LCSW 06/26/2023,  3:37 PM

## 2023-07-07 ENCOUNTER — Telehealth (HOSPITAL_BASED_OUTPATIENT_CLINIC_OR_DEPARTMENT_OTHER): Payer: BC Managed Care – PPO | Admitting: Psychiatry

## 2023-07-07 ENCOUNTER — Encounter (HOSPITAL_COMMUNITY): Payer: Self-pay | Admitting: Psychiatry

## 2023-07-07 VITALS — Wt 159.0 lb

## 2023-07-07 DIAGNOSIS — F419 Anxiety disorder, unspecified: Secondary | ICD-10-CM | POA: Diagnosis not present

## 2023-07-07 DIAGNOSIS — F4310 Post-traumatic stress disorder, unspecified: Secondary | ICD-10-CM

## 2023-07-07 DIAGNOSIS — F3181 Bipolar II disorder: Secondary | ICD-10-CM

## 2023-07-07 MED ORDER — BUPROPION HCL ER (XL) 150 MG PO TB24
150.0000 mg | ORAL_TABLET | Freq: Every day | ORAL | 0 refills | Status: DC
Start: 2023-07-07 — End: 2023-08-11

## 2023-07-07 MED ORDER — TOPIRAMATE 50 MG PO TABS
150.0000 mg | ORAL_TABLET | Freq: Every day | ORAL | 0 refills | Status: DC
Start: 2023-07-07 — End: 2023-08-21
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
## 2023-07-10 ENCOUNTER — Ambulatory Visit (HOSPITAL_COMMUNITY): Payer: BC Managed Care – PPO | Admitting: Licensed Clinical Social Worker

## 2023-07-15 ENCOUNTER — Ambulatory Visit (INDEPENDENT_AMBULATORY_CARE_PROVIDER_SITE_OTHER): Payer: BC Managed Care – PPO | Admitting: Licensed Clinical Social Worker

## 2023-07-15 DIAGNOSIS — F3181 Bipolar II disorder: Secondary | ICD-10-CM | POA: Diagnosis not present

## 2023-07-15 NOTE — Progress Notes (Unsigned)
 THERAPIST PROGRESS NOTE   Session Date: 07/15/2023  Session Time: 1603 - 1705  Virtual Visit via Video Note  I connected with Nicole Wallace on 07/15/23 at  4:03 PM EST by a video enabled telemedicine application and verified that I am speaking with the correct person using two identifiers.  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 The patient expressed understanding and agreed to proceed.   The patient was advised to call back or seek an in-person evaluation if the symptoms worsen or if the condition fails to improve as anticipated.  I provided 62 minutes of non-face-to-face time during this encounter.  Participation Level: Active  Behavioral Response: Neat and Well GroomedAlertEuthymic  Type of Therapy: Individual Therapy  Treatment Goals addressed:  - LTG: Reduce frequency, intensity, and duration of depression symptoms so that daily functioning is improved (OP Depression) - LTG: Increase coping skills to manage depression and improve ability to perform daily activities (OP Depression) - STG: Nicole Wallace will identify cognitive patterns and beliefs that support depression (OP Depression) - LTG: "Be able to talk through experiences without having an increased emotional response" (OP Depression) - LTG: Elimination of maladaptive behaviors and thinking patterns which interfere with resolution of trauma as evidenced by a reduction in avoidant behaviors. (BH CCP Acute or Chronic Trauma Reaction) - LTG: Recall traumatic events without becoming overwhelmed with negative emotions (BH CCP Acute or Chronic Trauma Reaction)  ProgressTowards Goals: Progressing  Interventions: CBT, Motivational Interviewing, and Supportive  Summary: Nicole Wallace is a 43 y.o. female with past psych history of Bipolar 2, PTSD, and Anxiety, presenting for follow-up therapy  session in efforts to improve management of presenting sxs. Pt actively engaged in check-in, presenting in overall pleasant moods and congruent affect throughout.   - Pt actively engaged in introductory check-in, sharing of things going well over the past two weeks, having had recent visits with Dr. Lolly Mustache, MD, med man provider and having made adjustments by titration to point of discontinue of Celexa as of today and adding Wellbutrin, finding to be of benefit, as well as recent visit with new DO to navigate concerns surrounding chronic fatigue, and having made necessary adjustments to increase iron and B12 due to low energy, beginning taking iron supplement and increased iron in diet, and being intentional on decreasing alcohol consumption also. - Engaged in reassessment of depressive and anxious sxs via PHQ-9 and GAD-7, noting of decrease of anxious sxs by 50% and depressive sxs by 100% in comparison to previous screenings two weeks prior, further processing observed reductions and changes in behaviors by pt. - Engaged pt in 4 mo. Review of individualized tx goals, specific to the management of depressive, anxious, and mood related sxs, extensively exploring observed progression in improved abilities in identifying and managing sxs, continued needed focus on negative thought patterns and perspectives, and abilities to recall hx of events without becoming overwhelmed. - Engaged in exploration of understanding of relationship between thoughts, feelings, and behaviors in relation to cognitive triangle and cycle of thoughts and the overall impact on outlook and perspectives. - Briefly introduced technique of 'Challenging Negative Thoughts' in order to support pt in processing impact of negative thoughts on moods and the ability to assess/analyze thoughts to prevent negative thoughts from becoming increasingly impactful on moods and perspectives.   Patient responded well to interventions. Patient continues to  meet criteria for Bipolar 2 and  PTSD, and Anxiety. Patient will continue to benefit from engagement in outpatient therapy due to being the least restrictive service to meet presenting needs.      07/15/2023    4:07 PM 06/26/2023    3:08 PM 06/12/2023    3:11 PM 05/29/2023    3:17 PM  GAD 7 : Generalized Anxiety Score  Nervous, Anxious, on Edge 1 1 0 0  Control/stop worrying 0 1 0 0  Worry too much - different things 0 0 0 0  Trouble relaxing 0 1 1 0  Restless 0 2 0 0  Easily annoyed or irritable 3 3 0 0  Afraid - awful might happen 0 0 0 0  Total GAD 7 Score 4 8 1  0  Anxiety Difficulty Not difficult at all Not difficult at all Somewhat difficult Not difficult at all      07/15/2023    4:10 PM 06/26/2023    3:16 PM 06/12/2023    3:13 PM 05/29/2023    3:19 PM 05/12/2023   12:00 PM  Depression screen PHQ 2/9  Decreased Interest 0 3 1 0 0  Down, Depressed, Hopeless 0 0 1 0 0  PHQ - 2 Score 0 3 2 0 0  Altered sleeping 0 0 0 2 0  Tired, decreased energy 0 1 1 0 0  Change in appetite 0 0 1 0 0  Feeling bad or failure about yourself  0 0 0 0   Trouble concentrating 0 3 1 2  0  Moving slowly or fidgety/restless 0 0 0 0 0  Suicidal thoughts 0 0 0 0 0  PHQ-9 Score 0 7 5 4  0  Difficult doing work/chores  Very difficult Not difficult at all Not difficult at all Not difficult at all    Suicidal/Homicidal: No  Therapist Response: Clinician utilized CBT, MI, and supportive reflection techniques to address and support pt in navigating presenting stressors and sxs. Actively engaged pt in check-in, assessing mood and affect.   - Openly engaged pt in introductory check-in, actively listening to pt's recounts of recent events, providing supportive reflections in processing thoughts and feelings surrounding recent events and observed improvements in moods and reduction in sxs. - Reassessed presentation of depressive and anxious sxs, via PHQ-9 and GAD-7, further eliciting pt's recounts of observed  improvements in sxs management and reduction of sxs. - Actively listened to pt's noted progressions relating to medication adjustments and observed improvements, as well as progressions in recent consultation with new DO surrounding medical factors. - Engaged pt in review of individualized goals outlined in tx plan, eliciting pt's thoughts and perspectives surrounding progression and continued areas for work. - Reviewed understanding of cognitive triangle and implications of negative thought patterns on perspectives. - Introduced 'Challenging Negative Thoughts' technique, in efforts to support pt's efforts at assessing negative thoughts and improving perspectives.  Clinician reassessed severity of presenting sxs, and presence of any safety concerns. Therapist provided support and empathy to patient during session.  Plan: Return again in 2 weeks.  Diagnosis:  Encounter Diagnosis  Name Primary?   Bipolar 2 disorder (HCC) Yes      Collaboration of Care: Psychiatrist AEB provider notes available in EHR.  Patient/Guardian was advised Release of Information must be obtained prior to any record release in order to collaborate their care with an outside provider. Patient/Guardian was advised if they have not already done so to contact the registration department to sign all necessary forms in order for [place] to release information regarding their  care.   Consent: Patient/Guardian gives verbal consent for treatment and assignment of benefits for services provided during this visit. Patient/Guardian expressed understanding and agreed to proceed.   Leisa Lenz, MSW, LCSW 07/15/2023,  4:12 PM

## 2023-07-24 ENCOUNTER — Encounter (HOSPITAL_COMMUNITY): Payer: Self-pay

## 2023-07-24 NOTE — Telephone Encounter (Signed)
 She may have a possible withdrawals from Celexa.  Not sure if she stopped Celexa abruptly.  She was told to cut down the Celexa from 40 mg to 20 mg.  She can resume Celexa 10 mg for 30 days which she can take with the Wellbutrin.  If agree please call the Celexa 10 mg for 30 days to her pharmacy

## 2023-07-28 ENCOUNTER — Ambulatory Visit (HOSPITAL_COMMUNITY): Payer: BC Managed Care – PPO | Admitting: Licensed Clinical Social Worker

## 2023-07-28 ENCOUNTER — Encounter (HOSPITAL_COMMUNITY): Payer: Self-pay

## 2023-07-28 ENCOUNTER — Other Ambulatory Visit (HOSPITAL_COMMUNITY): Payer: Self-pay

## 2023-07-28 DIAGNOSIS — F3181 Bipolar II disorder: Secondary | ICD-10-CM

## 2023-07-28 MED ORDER — CITALOPRAM HYDROBROMIDE 10 MG PO TABS: 10.0000 mg | ORAL_TABLET | Freq: Every day | ORAL | 0 refills | Status: AC

## 2023-07-28 NOTE — Progress Notes (Signed)
 THERAPIST PROGRESS NOTE   Session Date: 07/28/2023  Session Time: 1505 - 1602  Virtual Visit via Video Note  I connected with Nicole Wallace on 07/28/23 at  3:05 PM EST by a video enabled telemedicine application and verified that I am speaking with the correct person using two identifiers.  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 The patient expressed understanding and agreed to proceed.   The patient was advised to call back or seek an in-person evaluation if the symptoms worsen or if the condition fails to improve as anticipated.  I provided 57 minutes of non-face-to-face time during this encounter.  Participation Level: Active  Behavioral Response: Well GroomedAlertEuthymic  Type of Therapy: Individual Therapy  Treatment Goals addressed:  - LTG: Reduce frequency, intensity, and duration of depression symptoms so that daily functioning is improved (OP Depression) - LTG: Increase coping skills to manage depression and improve ability to perform daily activities (OP Depression) - STG: Nicole Wallace will identify cognitive patterns and beliefs that support depression (OP Depression) - LTG: "Be able to talk through experiences without having an increased emotional response" (OP Depression) - LTG: Elimination of maladaptive behaviors and thinking patterns which interfere with resolution of trauma as evidenced by a reduction in avoidant behaviors. (BH CCP Acute or Chronic Trauma Reaction) - LTG: Recall traumatic events without becoming overwhelmed with negative emotions (BH CCP Acute or Chronic Trauma Reaction)  ProgressTowards Goals: Progressing  Interventions: CBT, Motivational Interviewing, and Supportive  Summary: Nicole Wallace is a 43 y.o. female with past psych history of Bipolar 2, PTSD, and Anxiety, presenting for follow-up therapy session in  efforts to improve management of presenting sxs. Pt actively engaged in check-in,, presenting in overall pleasant moods and congruent affect throughout.   -Patient actively engaged in introductory check-in, providing detailed recounts of recent events, sharing of specific increased challenges when experiencing dizziness and brain zaps over the past 2 weeks, following the discontinue of Celexa, sharing of challenges in completing tasks at work, having connected with CMA and med man provider today with recommendation of resuming Celexa 10 mg.  Patient further detailed recent events, sharing of recent stress related to the approaching of her mother's birthday, and feeling of sisters overall disregard and inconsideration for patient and mother and planning a birthday event for mother out of state, when mother resides in [place].  Patient further shared of having recently engaged in discussion with mother surrounding her utilization of mental health services to support her in navigating history of trauma, management of presenting symptoms, and finding benefit and services in supporting patient in processing thoughts, feelings and perspectives.  Patient actively engaged in extensive exploration of recent reunification of with long-time female friend, and detailed extensive, prolonged challenges she has experienced throughout the past 20 years surrounding pursuing, and maintaining romantic relationships.  Patient further detailed challenges specifically surrounding expectations, standards, and factors she has not been willing to compromise on, that has proven to make it challenging to maintain healthy romantic relationships.  Patient responded well to interventions. Patient continues to meet criteria for Bipolar 2 and PTSD, and Anxiety. Patient will continue to benefit from engagement in outpatient therapy due to being the least restrictive service to meet presenting needs.      07/15/2023    4:07 PM 06/26/2023    3:08 PM  06/12/2023    3:11 PM 05/29/2023    3:17 PM  GAD 7 : Generalized Anxiety Score  Nervous, Anxious, on Edge 1 1 0 0  Control/stop worrying 0 1 0 0  Worry too much - different things 0 0 0 0  Trouble relaxing 0 1 1 0  Restless 0 2 0 0  Easily annoyed or irritable 3 3 0 0  Afraid - awful might happen 0 0 0 0  Total GAD 7 Score 4 8 1  0  Anxiety Difficulty Not difficult at all Not difficult at all Somewhat difficult Not difficult at all      07/15/2023    4:10 PM 06/26/2023    3:16 PM 06/12/2023    3:13 PM 05/29/2023    3:19 PM 05/12/2023   12:00 PM  Depression screen PHQ 2/9  Decreased Interest 0 3 1 0 0  Down, Depressed, Hopeless 0 0 1 0 0  PHQ - 2 Score 0 3 2 0 0  Altered sleeping 0 0 0 2 0  Tired, decreased energy 0 1 1 0 0  Change in appetite 0 0 1 0 0  Feeling bad or failure about yourself  0 0 0 0   Trouble concentrating 0 3 1 2  0  Moving slowly or fidgety/restless 0 0 0 0 0  Suicidal thoughts 0 0 0 0 0  PHQ-9 Score 0 7 5 4  0  Difficult doing work/chores  Very difficult Not difficult at all Not difficult at all Not difficult at all    Suicidal/Homicidal: No  Therapist Response: Clinician utilized CBT, MI, and supportive reflection techniques to address and support pt in navigating presenting stressors and sxs. Actively engaged pt in check-in, assessing mood and affect.   -Actively engaged patient in introductory check-in, assessing presenting mood and affect, eliciting patient recounts of recent events.  Actively listened to patient's detailed recounts of recent events, validating identified feelings, supporting and reflection of thoughts surrounding recent stressors and challenges.  Actively listened to patient's extensive recounts of history surrounding romantic relationship challenges and/or stressors, further revoking thoughts and feelings in relation to various experiences.  Utilized Socratic questioning to further support patient in greater exploration of identified thoughts,  in efforts to aid patient in processing rational versus irrational thoughts.  Clinician reassessed severity of presenting sxs, and presence of any safety concerns. Therapist provided support and empathy to patient during session.  Plan: Return again in 2 weeks.  Diagnosis:  Encounter Diagnosis  Name Primary?   Bipolar 2 disorder (HCC) Yes    Collaboration of Care: Psychiatrist AEB provider notes available in EHR.  Patient/Guardian was advised Release of Information must be obtained prior to any record release in order to collaborate their care with an outside provider. Patient/Guardian was advised if they have not already done so to contact the registration department to sign all necessary forms in order for [place] to release information regarding their care.   Consent: Patient/Guardian gives verbal consent for treatment and assignment of benefits for services provided during this visit. Patient/Guardian expressed understanding and agreed to proceed.   Nicole Wallace, MSW, LCSW 07/28/2023,  3:29 PM

## 2023-07-29 ENCOUNTER — Encounter (HOSPITAL_COMMUNITY): Payer: Self-pay

## 2023-07-30 NOTE — Telephone Encounter (Signed)
 Patient started on Wellbutrin.  She need to monitor the rash next few days if it does not get better or spreading to other areas and she need to stop the medication.

## 2023-08-04 NOTE — Telephone Encounter (Signed)
 Pt sent message stating that the rash was caused by citrus drink and also fruit that she ate. FYI. No further c/o.

## 2023-08-11 ENCOUNTER — Other Ambulatory Visit (HOSPITAL_COMMUNITY): Payer: Self-pay

## 2023-08-11 ENCOUNTER — Ambulatory Visit (INDEPENDENT_AMBULATORY_CARE_PROVIDER_SITE_OTHER): Admitting: Licensed Clinical Social Worker

## 2023-08-11 ENCOUNTER — Telehealth (HOSPITAL_COMMUNITY): Payer: Self-pay

## 2023-08-11 DIAGNOSIS — F3181 Bipolar II disorder: Secondary | ICD-10-CM

## 2023-08-11 DIAGNOSIS — F419 Anxiety disorder, unspecified: Secondary | ICD-10-CM

## 2023-08-11 MED ORDER — BUPROPION HCL ER (XL) 150 MG PO TB24: 150.0000 mg | ORAL_TABLET | Freq: Every day | ORAL | 0 refills | Status: DC

## 2023-08-11 MED ORDER — ARIPIPRAZOLE 2 MG PO TABS: 2.0000 mg | ORAL_TABLET | Freq: Every day | ORAL | 0 refills | Status: DC

## 2023-08-11 NOTE — Progress Notes (Signed)
 THERAPIST PROGRESS NOTE   Session Date: 08/11/2023  Session Time: 1614 - 1711  Virtual Visit via Video Note  I connected with Ruffin Pyo on 08/11/23 at  4:14 PM EST by a video enabled telemedicine application and verified that I am speaking with the correct person using two identifiers.  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 The patient expressed understanding and agreed to proceed.   The patient was advised to call back or seek an in-person evaluation if the symptoms worsen or if the condition fails to improve as anticipated.  I provided 57 minutes of non-face-to-face time during this encounter.  Participation Level: Active  Behavioral Response: Well GroomedAlertDepressed, Hopeless, and tearful  Type of Therapy: Individual Therapy  Treatment Goals addressed:  - LTG: Reduce frequency, intensity, and duration of depression symptoms so that daily functioning is improved (OP Depression) - LTG: Increase coping skills to manage depression and improve ability to perform daily activities (OP Depression) - STG: Amillya will identify cognitive patterns and beliefs that support depression (OP Depression) - LTG: "Be able to talk through experiences without having an increased emotional response" (OP Depression) - LTG: Elimination of maladaptive behaviors and thinking patterns which interfere with resolution of trauma as evidenced by a reduction in avoidant behaviors. (BH CCP Acute or Chronic Trauma Reaction) - LTG: Recall traumatic events without becoming overwhelmed with negative emotions (BH CCP Acute or Chronic Trauma Reaction)  ProgressTowards Goals: Not Progressing  Interventions: CBT, Motivational Interviewing, and Supportive  Summary: Denette is a 43 y.o. female with past psych history of Bipolar 2, PTSD, and Anxiety, presenting for follow-up  therapy session in efforts to improve management of presenting sxs.   Patient contacted CMA earlier this morning with complaints of increased emotional distress and mood dysregulation, reporting to CMA of not having a good day, sharing concerns of confusion, inability to focus and complete tasks, and increased tearfulness.  Patient further shared with CMA of interest in initiating Abilify having been successful previously.  Patient requested to connect with LCSW or MD, proving to agreeable to move tomorrow's scheduled appointments to earlier available time slot this afternoon, as well as agreeing to beginning Abilify 2 mg daily as recommended by Arfeen, MD.  Patient actively engaged in check-in, presenting in depressed an worthless moods, and congruent affect and frequent periods of tearfulness throughout duration of visit.  Patient proved reluctant to sharing details of recent stressors experienced over the past weekend, expressing "I do not want to talk about it", engaging further in processing experience symptoms throughout the day today, reporting of significant worsening depressive symptoms experienced this morning, following reflection of events over the weekend and what patient perceived to be manic episode.  Actively engaged patient in reassessing presenting depressive and anxious symptoms via PHQ-9 and GAD-7, reflecting on current scores, comparisons to prior screenings, and processing presenting risks.  Patient endorses passive SI, sharing of feeling alone and not having anybody to support her, and that she needs help in managing when experiencing such episodes.  Actively engaged in processing triggers, with patient identifying when feeling that she is becoming vulnerable with men, she finds this to trigger emotional dysregulation and exacerbate manic symptoms.  Further explored patient's reported challenges with completing tasks, maintaining focus, and managing stressors, processing unrealistic  expectations patient has set for herself and inabilities to always function at highest, unrealistic standards. Further processed patient's challenges with  being vulnerable with herself, and accepting and/or acknowledging when she needs support or the potential need to take time off work to support herself and getting well.  Explored timeframe of which patient last took a day off, supporting patient and actively challenging irrational, unrealistic anxious thoughts surrounding false perceptions of her inabilities to take time off work.  Ensured patient had accurate crisis support contact information, reviewing [employer] and mobile crisis.  Patient responded well to interventions. Patient continues to meet criteria for Bipolar 2 and PTSD, and Anxiety. Patient will continue to benefit from engagement in outpatient therapy due to being the least restrictive service to meet presenting needs.      08/11/2023    4:32 PM 07/15/2023    4:07 PM 06/26/2023    3:08 PM 06/12/2023    3:11 PM  GAD 7 : Generalized Anxiety Score  Nervous, Anxious, on Edge 0 1 1 0  Control/stop worrying 0 0 1 0  Worry too much - different things 3 0 0 0  Trouble relaxing 1 0 1 1  Restless 0 0 2 0  Easily annoyed or irritable 1 3 3  0  Afraid - awful might happen 0 0 0 0  Total GAD 7 Score 5 4 8 1   Anxiety Difficulty Not difficult at all Not difficult at all Not difficult at all Somewhat difficult      08/11/2023    4:35 PM 07/15/2023    4:10 PM 06/26/2023    3:16 PM 06/12/2023    3:13 PM 05/29/2023    3:19 PM  Depression screen PHQ 2/9  Decreased Interest 1 0 3 1 0  Down, Depressed, Hopeless 1 0 0 1 0  PHQ - 2 Score 2 0 3 2 0  Altered sleeping 0 0 0 0 2  Tired, decreased energy 0 0 1 1 0  Change in appetite 0 0 0 1 0  Feeling bad or failure about yourself  1 0 0 0 0  Trouble concentrating 0 0 3 1 2   Moving slowly or fidgety/restless 0 0 0 0 0  Suicidal thoughts 1 0 0 0 0  PHQ-9 Score 4 0 7 5 4   Difficult doing  work/chores Very difficult  Very difficult Not difficult at all Not difficult at all    Suicidal/Homicidal: Yes; Without plan, intent, or means. Pt reports experiencing passive SI, with no plan or intent.  Therapist Response: Clinician utilized CBT, MI, and supportive reflection techniques to address and support pt in navigating presenting stressors and sxs.  Engage patient in check-in, assessing presenting moods and affect, prompting patient's exploration of presenting mood, stressors, and challenges.  Reviewed information shared by CMA, confirming presenting concerns, further assessing presenting symptoms and risk factors.  Utilized open-ended questions to further elicit patient recounts of recent events, reframing questions to better assist patient in navigating reluctance.  Actively listened to patient's recounts of recent events and reports of increased depressive symptoms experienced today in response to manic behaviors exhibited over the weekend.  Utilized Socratic questions to further encourage critical thinking of irrational, unrealistic thoughts and feelings, supporting patient in challenging such thoughts.  Supported patient in review of suicide prevention and safety planning details, providing points of contact for [employer] and mobile crisis for urgent crisis response support.  Actively encouraged patient to implement appropriate boundaries with workplace and encouraged efforts to schedule days off for self-care and recovery.  Clinician reassessed severity of presenting sxs, and presence of any safety concerns. Therapist provided support  and empathy to patient during session.  Plan: Return again in 2 weeks.  Diagnosis:  Encounter Diagnosis  Name Primary?   Bipolar 2 disorder (HCC) Yes    Collaboration of Care: Psychiatrist AEB provider notes available in EHR.  Patient/Guardian was advised Release of Information must be obtained prior to any record release in order to collaborate  their care with an outside provider. Patient/Guardian was advised if they have not already done so to contact the registration department to sign all necessary forms in order for [place] to release information regarding their care.   Consent: Patient/Guardian gives verbal consent for treatment and assignment of benefits for services provided during this visit. Patient/Guardian expressed understanding and agreed to proceed.   Leisa Lenz, MSW, LCSW 08/11/2023,  5:03 PM

## 2023-08-11 NOTE — Telephone Encounter (Signed)
 Patient called me today very tearful and just wanting to talk. Patient does not feel like her medication is working - she does not feel depressed, just a lot of agitation and confusion. Patient is tearful when talking. After a long discussion and talking to Dr. Lolly Mustache and her therapist Fayrene Fearing we were able to get her in today to see therapist and Dr. Lolly Mustache has started patient on 2 mg Abilify with the intent of going to a long acting injection. Patient is in agreement with this plan and just wants to feel better.

## 2023-08-12 ENCOUNTER — Ambulatory Visit (HOSPITAL_COMMUNITY): Admitting: Licensed Clinical Social Worker

## 2023-08-13 ENCOUNTER — Ambulatory Visit (HOSPITAL_COMMUNITY): Payer: BC Managed Care – PPO | Admitting: Licensed Clinical Social Worker

## 2023-08-19 ENCOUNTER — Ambulatory Visit (HOSPITAL_COMMUNITY): Admitting: Licensed Clinical Social Worker

## 2023-08-19 DIAGNOSIS — F4310 Post-traumatic stress disorder, unspecified: Secondary | ICD-10-CM | POA: Diagnosis not present

## 2023-08-19 DIAGNOSIS — F419 Anxiety disorder, unspecified: Secondary | ICD-10-CM | POA: Diagnosis not present

## 2023-08-19 DIAGNOSIS — F3181 Bipolar II disorder: Secondary | ICD-10-CM

## 2023-08-19 NOTE — Progress Notes (Unsigned)
 THERAPIST PROGRESS NOTE   Session Date: 08/19/2023  Session Time: 1605 - 1704  Virtual Visit via Video Note  I connected with Nicole Wallace on 08/19/23 at  4:05 PM EST by a video enabled telemedicine application and verified that I am speaking with the correct person using two identifiers.  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 The patient expressed understanding and agreed to proceed.   The patient was advised to call back or seek an in-person evaluation if the symptoms worsen or if the condition fails to improve as anticipated.  I provided 59 minutes of non-face-to-face time during this encounter.  Participation Level: Active  Behavioral Response: Well GroomedAlertDepressed, Hopeless, and tearful  Type of Therapy: Individual Therapy  Treatment Goals addressed:  - LTG: Reduce frequency, intensity, and duration of depression symptoms so that daily functioning is improved (OP Depression) - LTG: Increase coping skills to manage depression and improve ability to perform daily activities (OP Depression) - STG: Nicole Wallace will identify cognitive patterns and beliefs that support depression (OP Depression) - LTG: "Be able to talk through experiences without having an increased emotional response" (OP Depression) - LTG: Elimination of maladaptive behaviors and thinking patterns which interfere with resolution of trauma as evidenced by a reduction in avoidant behaviors. (BH CCP Acute or Chronic Trauma Reaction) - LTG: Recall traumatic events without becoming overwhelmed with negative emotions (BH CCP Acute or Chronic Trauma Reaction)  ProgressTowards Goals: Not Progressing  Interventions: CBT, Motivational Interviewing, and Supportive  Summary: Nicole Wallace is a 43 y.o. female with past psych history of Bipolar 2, PTSD, and Anxiety, presenting for follow-up  therapy session in efforts to improve management of presenting sxs.   Patient actively engaged in session, presenting in overall improved moods and congruent affect throughout visit.  Patient openly engaged in introductory check-in, sharing of feeling better since adding the Abilify, expressing of feeling more "nonchalant" about things, further detailing of not feeling as emotionally dysregulated and proving able to let things just roll off.  Patient shared additionally of feeling to have gained a clear picture of things, being able to think clear, affirming of mood having improved as evidenced by feeling more playful and relaxed.  Patient detailed of having gone on a date recently with guys she was previously talking to, spending time with girl friends circle over the weekend, and finding herself to be more stable overall in order to function.  Briefly revisited challenges surrounding relationship with sister, processing her overall determination of no longer being willing to exert increased energy related to challenges within sibling relationship, further processing mother's role and behaviors in going between siblings.  Patient responded well to interventions. Patient continues to meet criteria for Bipolar 2 and PTSD, and Anxiety. Patient will continue to benefit from engagement in outpatient therapy due to being the least restrictive service to meet presenting needs.      08/11/2023    4:32 PM 07/15/2023    4:07 PM 06/26/2023    3:08 PM 06/12/2023    3:11 PM  GAD 7 : Generalized Anxiety Score  Nervous, Anxious, on Edge 0 1 1 0  Control/stop worrying 0 0 1 0  Worry too much - different things 3 0 0 0  Trouble relaxing 1 0 1 1  Restless 0 0 2 0  Easily annoyed or irritable 1 3 3  0  Afraid - awful might happen 0 0  0 0  Total GAD 7 Score 5 4 8 1   Anxiety Difficulty Not difficult at all Not difficult at all Not difficult at all Somewhat difficult      08/11/2023    4:35 PM 07/15/2023    4:10 PM  06/26/2023    3:16 PM 06/12/2023    3:13 PM 05/29/2023    3:19 PM  Depression screen PHQ 2/9  Decreased Interest 1 0 3 1 0  Down, Depressed, Hopeless 1 0 0 1 0  PHQ - 2 Score 2 0 3 2 0  Altered sleeping 0 0 0 0 2  Tired, decreased energy 0 0 1 1 0  Change in appetite 0 0 0 1 0  Feeling bad or failure about yourself  1 0 0 0 0  Trouble concentrating 0 0 3 1 2   Moving slowly or fidgety/restless 0 0 0 0 0  Suicidal thoughts 1 0 0 0 0  PHQ-9 Score 4 0 7 5 4   Difficult doing work/chores Very difficult  Very difficult Not difficult at all Not difficult at all    Suicidal/Homicidal: Yes; Without plan, intent, or means. Pt reports experiencing passive SI, with no plan or intent.  Therapist Response: Clinician utilized CBT, MI, and supportive reflection techniques to address and support pt in navigating presenting stressors and sxs.  Clinician actively greeted patient, engaging in introductory check-in, assessing presenting moods and affect, encouraging patient's reflection of recent events over the past week and exploration of factors proving to contribute to presenting moods.  Utilized open-ended questions to further elicit patient recounts of past weekly events, actively listening to the patient reflections of the events and reports of noticeable improvements in moods, thought content, and overall functioning, reporting of a decrease in symptoms of mood dysregulation, fluctuating emotions, and brain fog.  Utilized Socratic questioning to further encourage critical processing of thoughts and feelings surrounding events, stressors related to workplace, and individual approaches in managing stressors and efforts to reduce increased symptoms.  Clinician reassessed severity of presenting sxs, and presence of any safety concerns. Therapist provided support and empathy to patient during session.  Plan: Return again in 2 weeks.  Diagnosis:  Encounter Diagnoses  Name Primary?   Bipolar 2 disorder (HCC)  Yes   PTSD (post-traumatic stress disorder)    Anxiety      Collaboration of Care: Psychiatrist AEB provider notes available in EHR.  Patient/Guardian was advised Release of Information must be obtained prior to any record release in order to collaborate their care with an outside provider. Patient/Guardian was advised if they have not already done so to contact the registration department to sign all necessary forms in order for [place] to release information regarding their care.   Consent: Patient/Guardian gives verbal consent for treatment and assignment of benefits for services provided during this visit. Patient/Guardian expressed understanding and agreed to proceed.   Leisa Lenz, MSW, LCSW 08/19/2023,  4:06 PM

## 2023-08-21 ENCOUNTER — Telehealth (HOSPITAL_COMMUNITY): Payer: BC Managed Care – PPO | Admitting: Psychiatry

## 2023-08-21 ENCOUNTER — Encounter (HOSPITAL_COMMUNITY): Payer: Self-pay | Admitting: Psychiatry

## 2023-08-21 VITALS — Wt 160.0 lb

## 2023-08-21 DIAGNOSIS — F3181 Bipolar II disorder: Secondary | ICD-10-CM | POA: Diagnosis not present

## 2023-08-21 DIAGNOSIS — F4310 Post-traumatic stress disorder, unspecified: Secondary | ICD-10-CM

## 2023-08-21 DIAGNOSIS — F419 Anxiety disorder, unspecified: Secondary | ICD-10-CM

## 2023-08-21 MED ORDER — BUPROPION HCL ER (XL) 150 MG PO TB24
150.0000 mg | ORAL_TABLET | Freq: Every day | ORAL | 0 refills | Status: DC
Start: 2023-08-21 — End: 2023-11-24

## 2023-08-21 MED ORDER — ARIPIPRAZOLE 2 MG PO TABS: 2.0000 mg | ORAL_TABLET | Freq: Every day | ORAL | 0 refills | Status: DC

## 2023-08-21 MED ORDER — TRAZODONE HCL 50 MG PO TABS
50.0000 mg | ORAL_TABLET | Freq: Every evening | ORAL | 0 refills | Status: DC | PRN
Start: 2023-08-21 — End: 2024-02-19

## 2023-08-21 MED ORDER — TOPIRAMATE 50 MG PO TABS: 150.0000 mg | ORAL_TABLET | Freq: Every day | ORAL | 0 refills | Status: DC
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
## 2023-08-25 ENCOUNTER — Ambulatory Visit (HOSPITAL_COMMUNITY): Admitting: Licensed Clinical Social Worker

## 2023-08-25 DIAGNOSIS — F3181 Bipolar II disorder: Secondary | ICD-10-CM | POA: Diagnosis not present

## 2023-08-25 DIAGNOSIS — F419 Anxiety disorder, unspecified: Secondary | ICD-10-CM

## 2023-08-25 DIAGNOSIS — F4310 Post-traumatic stress disorder, unspecified: Secondary | ICD-10-CM | POA: Diagnosis not present

## 2023-08-25 NOTE — Progress Notes (Unsigned)
 THERAPIST PROGRESS NOTE   Session Date: 08/25/2023  Session Time: 1611 - 1716  Virtual Visit via Video Note  I connected with Nicole Wallace on 08/25/23 at  4:11 PM EST by a video enabled telemedicine application and verified that I am speaking with the correct person using two identifiers.  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 The patient expressed understanding and agreed to proceed.   The patient was advised to call back or seek an in-person evaluation if the symptoms worsen or if the condition fails to improve as anticipated.  I provided 65 minutes of non-face-to-face time during this encounter.  Participation Level: Active  Behavioral Response: Well GroomedAlertEuthymic  Type of Therapy: Individual Therapy  Treatment Goals addressed:  - LTG: Reduce frequency, intensity, and duration of depression symptoms so that daily functioning is improved (OP Depression) - LTG: Increase coping skills to manage depression and improve ability to perform daily activities (OP Depression) - STG: Melissaann will identify cognitive patterns and beliefs that support depression (OP Depression) - LTG: "Be able to talk through experiences without having an increased emotional response" (OP Depression) - LTG: Elimination of maladaptive behaviors and thinking patterns which interfere with resolution of trauma as evidenced by a reduction in avoidant behaviors. (BH CCP Acute or Chronic Trauma Reaction) - LTG: Recall traumatic events without becoming overwhelmed with negative emotions (BH CCP Acute or Chronic Trauma Reaction)  ProgressTowards Goals: Progressing  Interventions: CBT, Motivational Interviewing, and Supportive  Summary: Nicole Wallace is a 43 y.o. female with past psych history of Bipolar 2, PTSD, and Anxiety, presenting for follow-up therapy session in efforts  to improve management of presenting sxs.   Patient actively engaged in session, presenting in overall pleasant moods and congruent affect throughout duration of visit.  Patient actively engaged in introductory check-in, sharing perspective of things continuing to progress, and feeling improvements in management of emotions and moods.  Patient detailed recent visit with Dr. Lolly Mustache, MD, occurring last week, with Celexa being discontinued following recent titration.  Patient shared of having recently received message from sister, proving to ignore her sister's efforts at communicating regarding history of their relationship, proving to find self redirecting focus and not allowing sister to trigger her.  Patient further engaged in reflecting on continued observation of progressions, noting of improved interactions with colleagues within the workplace, and improved abilities and management of presenting stressors.  Briefly explored patient's continued avoidance of history of trauma, noting of having briefly spoken to events, however patient's preference to continue to refrain from exploring history at length.  Processed implications trauma has on brain functioning and how these proved to impact daily functioning.  Patient responded well to interventions. Patient continues to meet criteria for Bipolar 2 and PTSD, and Anxiety. Patient will continue to benefit from engagement in outpatient therapy due to being the least restrictive service to meet presenting needs.      08/25/2023    4:28 PM 08/11/2023    4:32 PM 07/15/2023    4:07 PM 06/26/2023    3:08 PM  GAD 7 : Generalized Anxiety Score  Nervous, Anxious, on Edge 0 0 1 1  Control/stop worrying 0 0 0 1  Worry too much - different things 0 3 0 0  Trouble relaxing 0 1 0 1  Restless 0 0 0 2  Easily annoyed or irritable 0 1 3 3   Afraid - awful might  happen 0 0 0 0  Total GAD 7 Score 0 5 4 8   Anxiety Difficulty Not difficult at all Not difficult at all Not  difficult at all Not difficult at all      08/25/2023    4:30 PM 08/11/2023    4:35 PM 07/15/2023    4:10 PM 06/26/2023    3:16 PM 06/12/2023    3:13 PM  Depression screen PHQ 2/9  Decreased Interest 0 1 0 3 1  Down, Depressed, Hopeless 0 1 0 0 1  PHQ - 2 Score 0 2 0 3 2  Altered sleeping 1 0 0 0 0  Tired, decreased energy 0 0 0 1 1  Change in appetite 0 0 0 0 1  Feeling bad or failure about yourself  0 1 0 0 0  Trouble concentrating 0 0 0 3 1  Moving slowly or fidgety/restless 0 0 0 0 0  Suicidal thoughts 0 1 0 0 0  PHQ-9 Score 1 4 0 7 5  Difficult doing work/chores Not difficult at all Very difficult  Very difficult Not difficult at all    Suicidal/Homicidal: No; Without plan, intent, or means.   Therapist Response: Clinician utilized CBT, MI, and supportive reflection techniques to address and support pt in navigating presenting stressors and sxs.  Clinician actively greeted patient upon joining virtual visit, engaging in introductory check-in, assessing presenting moods and affect, further prompting patient's recounts of daily events and factors contributing to presenting moods.  Utilized open-ended questions to further elicit patient recounts of recent events actively listening to reflections of presenting stressors and challenges experienced over the past week, reporting of challenging interactions solely with sister and continued efforts by sister to create conflict.  Engage patient in reassessing presenting depressive and anxious symptoms via PHQ-9 and GAD-7, reflecting on continued downward trend in screening scores and reduction of observe symptoms.  Utilized Socratic questioning to further evoke critical thinking surrounding identified thoughts and feelings in relation to stressful interactions.  Utilized MI and psychoeducational interventions to support patient in processing and understanding implications of trauma on individuals brain development/functioning and continued  implications on mental and emotional wellbeing.  Clinician reassessed severity of presenting sxs, and presence of any safety concerns. Therapist provided support and empathy to patient during session.  Plan: Return again in 2 weeks.  Diagnosis:  Encounter Diagnoses  Name Primary?   Bipolar 2 disorder (HCC) Yes   Anxiety    PTSD (post-traumatic stress disorder)       Collaboration of Care: Psychiatrist AEB provider notes available in EHR.  Patient/Guardian was advised Release of Information must be obtained prior to any record release in order to collaborate their care with an outside provider. Patient/Guardian was advised if they have not already done so to contact the registration department to sign all necessary forms in order for us  to release information regarding their care.   Consent: Patient/Guardian gives verbal consent for treatment and assignment of benefits for services provided during this visit. Patient/Guardian expressed understanding and agreed to proceed.   Patsi Boots, MSW, LCSW 08/25/2023,  4:32 PM

## 2023-08-27 ENCOUNTER — Ambulatory Visit (HOSPITAL_COMMUNITY): Payer: BC Managed Care – PPO | Admitting: Licensed Clinical Social Worker

## 2023-09-01 ENCOUNTER — Ambulatory Visit (INDEPENDENT_AMBULATORY_CARE_PROVIDER_SITE_OTHER): Admitting: Licensed Clinical Social Worker

## 2023-09-01 DIAGNOSIS — F419 Anxiety disorder, unspecified: Secondary | ICD-10-CM | POA: Diagnosis not present

## 2023-09-01 DIAGNOSIS — F4310 Post-traumatic stress disorder, unspecified: Secondary | ICD-10-CM | POA: Diagnosis not present

## 2023-09-01 DIAGNOSIS — F3181 Bipolar II disorder: Secondary | ICD-10-CM | POA: Diagnosis not present

## 2023-09-01 NOTE — Progress Notes (Signed)
 THERAPIST PROGRESS NOTE   Session Date: 09/01/2023  Session Time: 1500 - 1556  Virtual Visit via Video Note  I connected with Nicole Wallace on 09/01/23 at  3:00 PM EST by a video enabled telemedicine application and verified that I am speaking with the correct person using two identifiers.  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 The patient expressed understanding and agreed to proceed.  The patient was advised to call back or seek an in-person evaluation if the symptoms worsen or if the condition fails to improve as anticipated.  I provided 55 minutes of non-face-to-face time during this encounter.  Participation Level: Active  Behavioral Response: Well GroomedAlertEuthymic  Type of Therapy: Individual Therapy  Treatment Goals addressed:  - LTG: Reduce frequency, intensity, and duration of depression symptoms so that daily functioning is improved (OP Depression) - LTG: Increase coping skills to manage depression and improve ability to perform daily activities (OP Depression) - STG: Qiana will identify cognitive patterns and beliefs that support depression (OP Depression) - LTG: "Be able to talk through experiences without having an increased emotional response" (OP Depression) - LTG: Elimination of maladaptive behaviors and thinking patterns which interfere with resolution of trauma as evidenced by a reduction in avoidant behaviors. (BH CCP Acute or Chronic Trauma Reaction) - LTG: Recall traumatic events without becoming overwhelmed with negative emotions (BH CCP Acute or Chronic Trauma Reaction)  ProgressTowards Goals: Progressing  Interventions: CBT, Motivational Interviewing, and Supportive  Summary: Nicole Wallace is a 43 y.o. female with past psych history of Bipolar 2, PTSD, and Anxiety, presenting for follow-up therapy session in efforts  to improve management of presenting sxs.   Patient actively engaged in session, presenting in overall pleasant moods and congruent affect throughout duration of visit.  Patient actively engaged in introductory check-in, providing details contributing to presenting moods, noting of being tired to day due to weekend events.  Patient further reflected on events engaging throughout the weekend, sharing of recent date, further processing experience thoughts and feelings surrounding interest in individual, sharing of having felt self feeling a little anxious and more reserved than typical, not wanting to say the wrong thing, and having confided in close friend of believing she may be interested in pursuing further.  Patient further engaged in reflection of recent events and processing of experienced moods, noting of feeling more stable with the addition of Abilify  and discontinuation of Celexa , however proving to forget to take Wellbutrin  over the past 2 days, believing increased fatigue being a side effect.  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  Patient additionally detailed recent adjustments in family plans, preventing patient from having to spend time with sister over the holiday weekend, expressing feeling reduction in stress.  Patient responded well to interventions. Patient continues to meet criteria for Bipolar 2 and PTSD, and Anxiety. Patient will continue to benefit from engagement in outpatient therapy due to being the least restrictive service to meet presenting needs.      08/25/2023    4:28 PM 08/11/2023    4:32 PM 07/15/2023    4:07 PM 06/26/2023    3:08 PM  GAD 7 : Generalized Anxiety Score  Nervous, Anxious, on Edge 0 0 1 1  Control/stop worrying 0 0 0 1  Worry too much - different things 0 3 0 0  Trouble relaxing 0  1 0 1  Restless 0 0 0 2  Easily annoyed or irritable 0 1 3 3   Afraid - awful might  happen 0 0 0 0  Total GAD 7 Score 0 5 4 8   Anxiety Difficulty Not difficult at all Not difficult at all Not difficult at all Not difficult at all      08/25/2023    4:30 PM 08/11/2023    4:35 PM 07/15/2023    4:10 PM 06/26/2023    3:16 PM 06/12/2023    3:13 PM  Depression screen PHQ 2/9  Decreased Interest 0 1 0 3 1  Down, Depressed, Hopeless 0 1 0 0 1  PHQ - 2 Score 0 2 0 3 2  Altered sleeping 1 0 0 0 0  Tired, decreased energy 0 0 0 1 1  Change in appetite 0 0 0 0 1  Feeling bad or failure about yourself  0 1 0 0 0  Trouble concentrating 0 0 0 3 1  Moving slowly or fidgety/restless 0 0 0 0 0  Suicidal thoughts 0 1 0 0 0  PHQ-9 Score 1 4 0 7 5  Difficult doing work/chores Not difficult at all Very difficult  Very difficult Not difficult at all    Suicidal/Homicidal: No; Without plan, intent, or means.   Therapist Response: Clinician utilized CBT, MI, and supportive reflection techniques to address and support pt in navigating presenting stressors and sxs.  Clinician actively greeted patient upon joining visit, engaging in introductory check-in, assessing presenting moods and affect, and further prompting patient's reflection of daily events/plans and factors contributing to presentation.  Actively listened to patient's reflections of recent weekend events, supporting patient in processing expressed thoughts and feelings, utilizing Socratic questioning to further elicit greater critical thinking surrounding patient's thoughts and experiences in relation to recent date.  Utilized open-ended questions to further evoke patient's recounts of recent events and processing reported experience instances of minor anxiousness.  Utilized CBT, MI, and supportive reflection interventions to support patient in processing individual actions and/or behaviors in response to workplace stressors and overall feelings regarding pursuing alternate employment opportunities.  Clinician reassessed severity of  presenting sxs, and presence of any safety concerns. Therapist provided support and empathy to patient during session.  Plan: Return again in 2 weeks.  Diagnosis:  Encounter Diagnoses  Name Primary?   Bipolar 2 disorder (HCC) Yes   Anxiety    PTSD (post-traumatic stress disorder)     Collaboration of Care: Psychiatrist AEB provider notes available in EHR.  Patient/Guardian was advised Release of Information must be obtained prior to any record release in order to collaborate their care with an outside provider. Patient/Guardian was advised if they have not already done so to contact the registration department to sign all necessary forms in order for us  to release information regarding their care.   Consent: Patient/Guardian gives verbal consent for treatment and assignment of benefits for services provided during this visit. Patient/Guardian expressed understanding and agreed to proceed.   Patsi Boots, MSW, LCSW 09/01/2023,  3:07 PM

## 2023-09-08 ENCOUNTER — Ambulatory Visit (INDEPENDENT_AMBULATORY_CARE_PROVIDER_SITE_OTHER): Admitting: Licensed Clinical Social Worker

## 2023-09-08 DIAGNOSIS — F3181 Bipolar II disorder: Secondary | ICD-10-CM

## 2023-09-08 NOTE — Progress Notes (Unsigned)
 THERAPIST PROGRESS NOTE   Session Date: 09/08/2023  Session Time: 1505 - 1556  Virtual Visit via Video Note  I connected with Nicole Wallace on 09/08/23 at  3:05 PM EST by a video enabled telemedicine application and verified that I am speaking with the correct person using two identifiers.  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 The patient expressed understanding and agreed to proceed.  The patient was advised to call back or seek an in-person evaluation if the symptoms worsen or if the condition fails to improve as anticipated.  I provided 55 minutes of non-face-to-face time during this encounter.  Participation Level: Active  Behavioral Response: Well GroomedAlertEuthymic  Type of Therapy: Individual Therapy  Treatment Goals addressed:  - LTG: Reduce frequency, intensity, and duration of depression symptoms so that daily functioning is improved (OP Depression) - LTG: Increase coping skills to manage depression and improve ability to perform daily activities (OP Depression) - STG: Brittnee will identify cognitive patterns and beliefs that support depression (OP Depression) - LTG: "Be able to talk through experiences without having an increased emotional response" (OP Depression) - LTG: Elimination of maladaptive behaviors and thinking patterns which interfere with resolution of trauma as evidenced by a reduction in avoidant behaviors. (BH CCP Acute or Chronic Trauma Reaction) - LTG: Recall traumatic events without becoming overwhelmed with negative emotions (BH CCP Acute or Chronic Trauma Reaction)  ProgressTowards Goals: Progressing  Interventions: CBT, Motivational Interviewing, and Supportive  Summary: Nicole Wallace is a 43 y.o. female with past psych history of Bipolar 2, PTSD, and Anxiety, presenting for follow-up therapy session in efforts  to improve management of presenting sxs.   Patient actively engaged in session, presenting and euthymic moods and congruent affect throughout duration of visit, engaging in brief reflection of events of past week and detailing factors contributing to presenting moods.  Patient shared "Things have been really good", reflecting on recent events, detailing of having reconnected with prior individual whom she had dated between 1 to 2 years ago, sharing of having made initial contact, and receiving follow-up contact from individual later leading to reconnecting and meeting for dinner date over the past weekend.  Patient further engaged in reflection of continued observed improvements in moods and emotional regulation over the past week, sharing feeling better as well as noting of others having observed improvements in patient's presentation.  Patient actively engaged in assessing presenting depressive and anxious symptoms via PHQ-9 and GAD-7, noting of only significant concern being related to challenges concentrating, specifically citing needs and efforts to read material for work, finding this to be increasingly challenging.   Patient responded well to interventions. Patient continues to meet criteria for Bipolar 2 and PTSD, and Anxiety. Patient will continue to benefit from engagement in outpatient therapy due to being the least restrictive service to meet presenting needs.      09/08/2023    3:54 PM 08/25/2023    4:28 PM 08/11/2023    4:32 PM 07/15/2023    4:07 PM  GAD 7 : Generalized Anxiety Score  Nervous, Anxious, on Edge 0 0 0 1  Control/stop worrying 0 0 0 0  Worry too much - different things 0 0 3 0  Trouble relaxing 0 0 1 0  Restless 0 0 0 0  Easily annoyed or irritable 0 0 1 3  Afraid - awful might happen 0 0 0 0  Total  GAD 7 Score 0 0 5 4  Anxiety Difficulty  Not difficult at all Not difficult at all Not difficult at all      09/08/2023    3:55 PM 08/25/2023    4:30 PM 08/11/2023    4:35  PM 07/15/2023    4:10 PM 06/26/2023    3:16 PM  Depression screen PHQ 2/9  Decreased Interest 0 0 1 0 3  Down, Depressed, Hopeless 0 0 1 0 0  PHQ - 2 Score 0 0 2 0 3  Altered sleeping 0 1 0 0 0  Tired, decreased energy 0 0 0 0 1  Change in appetite 0 0 0 0 0  Feeling bad or failure about yourself  0 0 1 0 0  Trouble concentrating 2 0 0 0 3  Moving slowly or fidgety/restless 0 0 0 0 0  Suicidal thoughts 0 0 1 0 0  PHQ-9 Score 2 1 4  0 7  Difficult doing work/chores Somewhat difficult Not difficult at all Very difficult  Very difficult    Suicidal/Homicidal: No; Without plan, intent, or means.   Therapist Response: Clinician utilized CBT, MI, and supportive reflection techniques to address and support pt in navigating presenting stressors and sxs.  Clinician actively greeted patient on joining visit, engaging patient in introductory check-in, assessing presenting moods and affect and further eliciting patient's reflection of recent events and factors contributing to presenting moods.  Actively listened to patient's reflections of past week, utilizing open-ended questions to further prompt patient's exploration of thoughts and feelings related to recent events and variances in perspectives and outlook observed over the past week in comparison to prior weeks.  Revisited cognitive distortions handout and cognitive triangle and supporting patient in processing implications of negative perspectives and outlooks on thought patterns, supporting patient in processing further relationship between thoughts, feelings, and behavioral responses to such.  Clinician reassessed severity of presenting sxs, and presence of any safety concerns. Therapist provided support and empathy to patient during session.  Plan: Return again in 2 weeks.  Diagnosis:  Encounter Diagnosis  Name Primary?   Bipolar 2 disorder (HCC) Yes     Collaboration of Care: Psychiatrist AEB provider notes available in  EHR.  Patient/Guardian was advised Release of Information must be obtained prior to any record release in order to collaborate their care with an outside provider. Patient/Guardian was advised if they have not already done so to contact the registration department to sign all necessary forms in order for us  to release information regarding their care.   Consent: Patient/Guardian gives verbal consent for treatment and assignment of benefits for services provided during this visit. Patient/Guardian expressed understanding and agreed to proceed.   Patsi Boots, MSW, LCSW 09/08/2023,  3:57 PM

## 2023-09-11 ENCOUNTER — Ambulatory Visit (HOSPITAL_COMMUNITY): Payer: BC Managed Care – PPO | Admitting: Licensed Clinical Social Worker

## 2023-09-16 ENCOUNTER — Ambulatory Visit (INDEPENDENT_AMBULATORY_CARE_PROVIDER_SITE_OTHER): Admitting: Licensed Clinical Social Worker

## 2023-09-16 DIAGNOSIS — F419 Anxiety disorder, unspecified: Secondary | ICD-10-CM

## 2023-09-16 DIAGNOSIS — F3181 Bipolar II disorder: Secondary | ICD-10-CM

## 2023-09-16 DIAGNOSIS — F4310 Post-traumatic stress disorder, unspecified: Secondary | ICD-10-CM | POA: Diagnosis not present

## 2023-09-16 NOTE — Progress Notes (Unsigned)
 THERAPIST PROGRESS NOTE   Session Date: 09/16/2023  Session Time: 1610 - 1700 Virtual Visit via Video Note  I connected with Nicole Wallace on 09/16/23 at  4:00 PM EDT by a video enabled telemedicine application and verified that I am speaking with the correct person using two identifiers.  Location: Patient: Home Provider: Home Office   I discussed the limitations of evaluation and management by telemedicine and the availability of in person appointments. The patient expressed understanding and agreed to proceed.  I discussed the assessment and treatment plan with the patient. The patient was provided an opportunity to ask questions and all were answered. The patient agreed with the plan and demonstrated an understanding of the instructions.   The patient was advised to call back or seek an in-person evaluation if the symptoms worsen or if the condition fails to improve as anticipated.  I provided 45 minutes of non-face-to-face time during this encounter.  Participation Level: Active  Behavioral Response: Well GroomedAlertEuthymic  Type of Therapy: Individual Therapy  Treatment Goals addressed:  - LTG: Reduce frequency, intensity, and duration of depression symptoms so that daily functioning is improved (OP Depression) - LTG: Increase coping skills to manage depression and improve ability to perform daily activities (OP Depression) - STG: Jenet will identify cognitive patterns and beliefs that support depression (OP Depression) - LTG: "Be able to talk through experiences without having an increased emotional response" (OP Depression) - LTG: Elimination of maladaptive behaviors and thinking patterns which interfere with resolution of trauma as evidenced by a reduction in avoidant behaviors. (BH CCP Acute or Chronic Trauma Reaction) - LTG: Recall traumatic events without becoming overwhelmed with negative emotions (BH CCP Acute or Chronic Trauma Reaction)  ProgressTowards Goals:  Progressing  Interventions: CBT, Motivational Interviewing, and Supportive  Summary: Nicole Wallace is a 43 y.o. female with past psych history of Bipolar 2, PTSD, and Anxiety, presenting for follow-up therapy session in efforts to improve management of presenting sxs.   Patient actively engaged in session, presenting and overall pleasant moods and congruent affect throughout duration of visit, engaging in brief reflection of events of past week and detailing factors contributing to presenting moods. Pt detailed having been laid off from work last week, ***    Patient shared "Things have been really good", reflecting on recent events, detailing of having reconnected with prior individual whom she had dated between 1 to 2 years ago, sharing of having made initial contact, and receiving follow-up contact from individual later leading to reconnecting and meeting for dinner date over the past weekend.  Patient further engaged in reflection of continued observed improvements in moods and emotional regulation over the past week, sharing feeling better as well as noting of others having observed improvements in patient's presentation.  Patient actively engaged in assessing presenting depressive and anxious symptoms via PHQ-9 and GAD-7, noting of only significant concern being related to challenges concentrating, specifically citing needs and efforts to read material for work, finding this to be increasingly challenging.   Patient responded well to interventions. Patient continues to meet criteria for Bipolar 2 and PTSD, and Anxiety. Patient will continue to benefit from engagement in outpatient therapy due to being the least restrictive service to meet presenting needs.      09/08/2023    3:54 PM 08/25/2023    4:28 PM 08/11/2023    4:32 PM 07/15/2023    4:07 PM  GAD 7 : Generalized Anxiety Score  Nervous, Anxious, on Edge 0 0 0 1  Control/stop  worrying 0 0 0 0  Worry too much - different things 0 0 3 0  Trouble  relaxing 0 0 1 0  Restless 0 0 0 0  Easily annoyed or irritable 0 0 1 3  Afraid - awful might happen 0 0 0 0  Total GAD 7 Score 0 0 5 4  Anxiety Difficulty  Not difficult at all Not difficult at all Not difficult at all      09/08/2023    3:55 PM 08/25/2023    4:30 PM 08/11/2023    4:35 PM 07/15/2023    4:10 PM 06/26/2023    3:16 PM  Depression screen PHQ 2/9  Decreased Interest 0 0 1 0 3  Down, Depressed, Hopeless 0 0 1 0 0  PHQ - 2 Score 0 0 2 0 3  Altered sleeping 0 1 0 0 0  Tired, decreased energy 0 0 0 0 1  Change in appetite 0 0 0 0 0  Feeling bad or failure about yourself  0 0 1 0 0  Trouble concentrating 2 0 0 0 3  Moving slowly or fidgety/restless 0 0 0 0 0  Suicidal thoughts 0 0 1 0 0  PHQ-9 Score 2 1 4  0 7  Difficult doing work/chores Somewhat difficult Not difficult at all Very difficult  Very difficult    Suicidal/Homicidal: No; Without plan, intent, or means.   Therapist Response: Clinician utilized CBT, MI, and supportive reflection techniques to address and support pt in navigating presenting stressors and sxs.  Clinician actively greeted patient on joining visit, engaging patient in introductory check-in, assessing presenting moods and affect and further eliciting patient's reflection of recent events and factors contributing to presenting moods. ***    Actively listened to patient's reflections of past week, utilizing open-ended questions to further prompt patient's exploration of thoughts and feelings related to recent events and variances in perspectives and outlook observed over the past week in comparison to prior weeks.  Revisited cognitive distortions handout and cognitive triangle and supporting patient in processing implications of negative perspectives and outlooks on thought patterns, supporting patient in processing further relationship between thoughts, feelings, and behavioral responses to such.  Clinician reassessed severity of presenting sxs, and  presence of any safety concerns. Therapist provided support and empathy to patient during session.  Plan: Return again in 2 weeks.  Diagnosis:  Encounter Diagnoses  Name Primary?   Bipolar 2 disorder (HCC) Yes   Anxiety    PTSD (post-traumatic stress disorder)    Collaboration of Care: Psychiatrist AEB provider notes available in EHR.  Patient/Guardian was advised Release of Information must be obtained prior to any record release in order to collaborate their care with an outside provider. Patient/Guardian was advised if they have not already done so to contact the registration department to sign all necessary forms in order for us  to release information regarding their care.   Consent: Patient/Guardian gives verbal consent for treatment and assignment of benefits for services provided during this visit. Patient/Guardian expressed understanding and agreed to proceed.   Patsi Boots, MSW, LCSW 09/16/2023,  4:14 PM

## 2023-09-17 ENCOUNTER — Telehealth (HOSPITAL_COMMUNITY): Payer: Self-pay

## 2023-09-17 NOTE — Telephone Encounter (Signed)
 Patient called and wants you to know that she has already gained 8 lbs this month. She does not want to stop the Abilify  because it is working. But she also does not want the weight. She is asking if you are allowed to put her on Ozempic. Please review and advise, thank you

## 2023-09-18 NOTE — Telephone Encounter (Signed)
 She contact with PCP to getweight loss medication.

## 2023-09-22 NOTE — Telephone Encounter (Signed)
 Called patient and let her know.

## 2023-09-29 ENCOUNTER — Encounter (HOSPITAL_COMMUNITY): Payer: Self-pay

## 2023-09-29 ENCOUNTER — Ambulatory Visit (INDEPENDENT_AMBULATORY_CARE_PROVIDER_SITE_OTHER): Admitting: Licensed Clinical Social Worker

## 2023-09-29 DIAGNOSIS — F4310 Post-traumatic stress disorder, unspecified: Secondary | ICD-10-CM

## 2023-09-29 DIAGNOSIS — F3181 Bipolar II disorder: Secondary | ICD-10-CM

## 2023-09-29 NOTE — Progress Notes (Signed)
 THERAPIST PROGRESS NOTE   Session Date: 09/29/2023  Session Time: 1505 - 1550 Virtual Visit via Video Note  I connected with Nicole Wallace on 09/29/23 at  3:00 PM EDT by a video enabled telemedicine application and verified that I am speaking with the correct person using two identifiers.  Location: Patient: Home Provider: Home Office   I discussed the limitations of evaluation and management by telemedicine and the availability of in person appointments. The patient expressed understanding and agreed to proceed.  The patient was advised to call back or seek an in-person evaluation if the symptoms worsen or if the condition fails to improve as anticipated.  I provided 45 minutes of non-face-to-face time during this encounter.  Participation Level: Active  Behavioral Response: Well GroomedAlertEuthymic  Type of Therapy: Individual Therapy  Treatment Goals addressed:  - LTG: Reduce frequency, intensity, and duration of depression symptoms so that daily functioning is improved (OP Depression) - LTG: Increase coping skills to manage depression and improve ability to perform daily activities (OP Depression) - STG: Nicole Wallace will identify cognitive patterns and beliefs that support depression (OP Depression) - LTG: "Be able to talk through experiences without having an increased emotional response" (OP Depression) - LTG: Elimination of maladaptive behaviors and thinking patterns which interfere with resolution of trauma as evidenced by a reduction in avoidant behaviors. (BH CCP Acute or Chronic Trauma Reaction) - LTG: Recall traumatic events without becoming overwhelmed with negative emotions (BH CCP Acute or Chronic Trauma Reaction)  ProgressTowards Goals: Progressing  Interventions: CBT, Motivational Interviewing, and Supportive  Summary: Nicole Wallace is a 43 y.o. female with past psych history of Bipolar 2, PTSD, and Anxiety, presenting for follow-up therapy session in efforts to improve  management of presenting sxs.   Patient actively engaged in session, presenting in overall pleasant moods, with periods of irritability and anxiousness surrounding recent events, and flat affect throughout duration of visit. Pt actively engaged in introductory check-in, sharing of "It's been rough, just making it, day by day" further detailing stress surrounding wanting to secure work but process being prolonged due to having to return for follow up interviews. Pt further shared of finding self being bored, processing lack of interest in pursuing new interests/hobbies, with no interest in walking, exercising, or spending time outdoors. Processed alternate means of making income, to which pt was resistant to due to wanting to limit wear on vehicle. Explored level of support received from family, noting of mother and father's support, with minimal support from sister, further processing relationship with sister and the way sister proves to treat individuals when not being able to be dependent or supported financially. Explored pt's opportunity to explore future goals at this time, processing benefits of utilizing vision boards to aid in provision of support and guidance.  Patient responded well to interventions. Patient continues to meet criteria for Bipolar 2 and PTSD, and Anxiety. Patient will continue to benefit from engagement in outpatient therapy due to being the least restrictive service to meet presenting needs.      09/08/2023    3:54 PM 08/25/2023    4:28 PM 08/11/2023    4:32 PM 07/15/2023    4:07 PM  GAD 7 : Generalized Anxiety Score  Nervous, Anxious, on Edge 0 0 0 1  Control/stop worrying 0 0 0 0  Worry too much - different things 0 0 3 0  Trouble relaxing 0 0 1 0  Restless 0 0 0 0  Easily annoyed or irritable 0 0 1 3  Afraid - awful might happen 0 0 0 0  Total GAD 7 Score 0 0 5 4  Anxiety Difficulty  Not difficult at all Not difficult at all Not difficult at all      09/08/2023     3:55 PM 08/25/2023    4:30 PM 08/11/2023    4:35 PM 07/15/2023    4:10 PM 06/26/2023    3:16 PM  Depression screen PHQ 2/9  Decreased Interest 0 0 1 0 3  Down, Depressed, Hopeless 0 0 1 0 0  PHQ - 2 Score 0 0 2 0 3  Altered sleeping 0 1 0 0 0  Tired, decreased energy 0 0 0 0 1  Change in appetite 0 0 0 0 0  Feeling bad or failure about yourself  0 0 1 0 0  Trouble concentrating 2 0 0 0 3  Moving slowly or fidgety/restless 0 0 0 0 0  Suicidal thoughts 0 0 1 0 0  PHQ-9 Score 2 1 4  0 7  Difficult doing work/chores Somewhat difficult Not difficult at all Very difficult  Very difficult    Suicidal/Homicidal: No; Without plan, intent, or means.   Therapist Response: Clinician utilized CBT, MI, and supportive reflection techniques to address and support pt in navigating presenting stressors and sxs.  Clinician actively greeted pt upon joining virtual visit, engaging in introductory check-in, assessing presenting moods and affect, and further prompting pt's recounts of events of the past weeks and factors contributing to presenting moods. Actively listened to pt's recounts of events and factors contributing to continued frustrations surrounding lack of work, utilizing open ended questions to support pt in navigating presenting thoughts and feelings, employing socratic questioning to elicit greater critical processing of expressed thoughts and feelings. Provided support and empathy for pt's expressed challenges, exploring behavioral activation techniques to support pt in maintaining improved moods, engaged in exploring new interests to include adding time spent outdoors, and developing vision board to support in processing of goals and motivation factors.  Clinician reassessed severity of presenting sxs, and presence of any safety concerns. Therapist provided support and empathy to patient during session.  Plan: Return again in 2 weeks.  Diagnosis:  Encounter Diagnoses  Name Primary?   Bipolar 2  disorder (HCC) Yes   PTSD (post-traumatic stress disorder)     Collaboration of Care: Psychiatrist AEB provider notes available in EHR.  Patient/Guardian was advised Release of Information must be obtained prior to any record release in order to collaborate their care with an outside provider. Patient/Guardian was advised if they have not already done so to contact the registration department to sign all necessary forms in order for us  to release information regarding their care.   Consent: Patient/Guardian gives verbal consent for treatment and assignment of benefits for services provided during this visit. Patient/Guardian expressed understanding and agreed to proceed.   Patsi Boots, MSW, LCSW 09/29/2023,  3:52 PM

## 2023-10-09 ENCOUNTER — Encounter (HOSPITAL_COMMUNITY): Payer: Self-pay

## 2023-10-09 NOTE — Telephone Encounter (Signed)
 Pt is wanting to d/c Abilify  as she read it may cause dementia. Family h/o dementia. Pt has f/u 11/24/23

## 2023-10-09 NOTE — Telephone Encounter (Signed)
 Do not discontinue Abilify  just monitor the symptoms.

## 2023-10-13 ENCOUNTER — Ambulatory Visit (INDEPENDENT_AMBULATORY_CARE_PROVIDER_SITE_OTHER): Admitting: Licensed Clinical Social Worker

## 2023-10-13 DIAGNOSIS — F3181 Bipolar II disorder: Secondary | ICD-10-CM | POA: Diagnosis not present

## 2023-10-13 NOTE — Progress Notes (Signed)
 THERAPIST PROGRESS NOTE   Session Date: 10/13/2023  Session Time: 1000 - 1055 Virtual Visit via Video Note  I connected with Devynne Adcox on 10/13/23 at 10:00 AM EDT by a video enabled telemedicine application and verified that I am speaking with the correct person using two identifiers.  Location: Patient: Home Provider: Home Office   I discussed the limitations of evaluation and management by telemedicine and the availability of in person appointments. The patient expressed understanding and agreed to proceed.  The patient was advised to call back or seek an in-person evaluation if the symptoms worsen or if the condition fails to improve as anticipated.  I provided 54 minutes of non-face-to-face time during this encounter.  Participation Level: Active  Behavioral Response: Well GroomedAlertEuthymic  Type of Therapy: Individual Therapy  Treatment Goals addressed:   Progressing (3) LTG: Increase coping skills to manage depression and improve ability to perform daily activities (OP Depression) STG: [place]on will identify cognitive patterns and beliefs that support depression (OP Depression) LTG: "Be able to talk through experiences without having an increased emotional response" (OP Depression)  Completed/Met (3) LTG: Reduce frequency, intensity, and duration of depression symptoms so that daily functioning is improved (OP Depression) LTG: Elimination of maladaptive behaviors and thinking patterns which interfere with resolution of trauma as evidenced by a reduction in avoidant behaviors. (BH CCP Acute or Chronic Trauma Reaction) LTG: Recall traumatic events without becoming overwhelmed with negative emotions (BH CCP Acute or Chronic Trauma Reaction)  ProgressTowards Goals: Progressing  Interventions: CBT, Motivational Interviewing, and Supportive  Summary: Elizzie is a 43 y.o. female with past psych history of Bipolar 2, PTSD, and Anxiety, presenting for follow-up therapy  session in efforts to improve management of presenting sxs.   Patient actively engaged in session, presenting in overall pleasant moods, with periods of irritability and anxiousness surrounding recent events, and flat affect throughout duration of visit. Pt actively engaged in introductory check-in, sharing of "It's been alright, depending on how I feel. 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 I've been spending a lot of time with my neighbors but I've been needing breaks after a while, my social battery runs out". Processed concerns related to continuing Abilify  to include weight gain and links to dementia.  Further processed identified feelings surrounding anger related to separation of employment a little over a month ago, processing of underlying feelings proving to present as anger, as well as efforts at utilizing events and feelings of anger as motivating factors to seek and secure a future employment.  Engaged in finalizing of updates and revisions to individualized treatment plan, processing of various goals having been completed and continued areas for work.   Patient responded well to interventions. Patient continues to meet criteria for Bipolar 2 and PTSD, and Anxiety. Patient will continue to benefit from engagement in outpatient therapy due to being the least restrictive service to meet presenting needs.      10/13/2023   10:51 AM 09/08/2023    3:54 PM 08/25/2023    4:28 PM 08/11/2023    4:32 PM  GAD 7 : Generalized Anxiety Score  Nervous, Anxious, on Edge 1 0 0 0  Control/stop worrying 1 0 0 0  Worry too much - different things 1 0 0 3  Trouble relaxing 1 0 0 1  Restless 1 0 0 0  Easily annoyed or irritable 1 0 0 1  Afraid - awful might happen 0 0 0 0  Total GAD 7 Score 6 0 0 5  Anxiety Difficulty Not difficult at all  Not difficult at all Not difficult at all      10/13/2023   10:53 AM 09/08/2023    3:55 PM 08/25/2023    4:30 PM 08/11/2023    4:35 PM 07/15/2023    4:10 PM   Depression screen PHQ 2/9  Decreased Interest 2 0 0 1 0  Down, Depressed, Hopeless 1 0 0 1 0  PHQ - 2 Score 3 0 0 2 0  Altered sleeping 0 0 1 0 0  Tired, decreased energy 0 0 0 0 0  Change in appetite 0 0 0 0 0  Feeling bad or failure about yourself  1 0 0 1 0  Trouble concentrating 1 2 0 0 0  Moving slowly or fidgety/restless 0 0 0 0 0  Suicidal thoughts 0 0 0 1 0  PHQ-9 Score 5 2 1 4  0  Difficult doing work/chores Not difficult at all Somewhat difficult Not difficult at all Very difficult     Suicidal/Homicidal: No; Without plan, intent, or means.   Therapist Response: Clinician utilized CBT, MI, and supportive reflection techniques to address and support pt in navigating presenting stressors and sxs.  Clinician actively greeted pt upon joining virtual visit, actively engaging patient in introductory check-in, assessing presenting moods and affect, and engaging patient in reflection of recent events and factors contributing to presenting moods.  Actively listened to patient's reflection of events, providing validation and support for express feelings surrounding continued frustrations related to being laid off at the end of April.  Supported patient in challenging negative thoughts, reframing thoughts, and processing factors that can aid in reframing perspectives.  Finalized revised treatment plan, supporting patient in determining areas for continued work.  Reassess presenting depressive and anxious symptoms via PHQ-9 and GAD-7, noting of minor increase in symptoms surrounding current employment status.  Clinician reassessed severity of presenting sxs, and presence of any safety concerns. Therapist provided support and empathy to patient during session.  Plan: Return again in 2 weeks.  Diagnosis:  Encounter Diagnosis  Name Primary?   Bipolar 2 disorder (HCC) Yes     Collaboration of Care: Psychiatrist AEB provider notes available in EHR.  Patient/Guardian was advised Release of  Information must be obtained prior to any record release in order to collaborate their care with an outside provider. Patient/Guardian was advised if they have not already done so to contact the registration department to sign all necessary forms in order for us  to release information regarding their care.   Consent: Patient/Guardian gives verbal consent for treatment and assignment of benefits for services provided during this visit. Patient/Guardian expressed understanding and agreed to proceed.   Patsi Boots, MSW, LCSW 10/13/2023,  10:55 AM

## 2023-10-15 ENCOUNTER — Ambulatory Visit (HOSPITAL_COMMUNITY): Admitting: Licensed Clinical Social Worker

## 2023-10-20 ENCOUNTER — Ambulatory Visit (HOSPITAL_COMMUNITY): Admitting: Licensed Clinical Social Worker

## 2023-10-20 ENCOUNTER — Ambulatory Visit (INDEPENDENT_AMBULATORY_CARE_PROVIDER_SITE_OTHER): Admitting: Licensed Clinical Social Worker

## 2023-10-20 ENCOUNTER — Encounter (HOSPITAL_COMMUNITY): Payer: Self-pay

## 2023-10-20 DIAGNOSIS — Z91199 Patient's noncompliance with other medical treatment and regimen due to unspecified reason: Secondary | ICD-10-CM

## 2023-10-20 NOTE — Progress Notes (Signed)
 THERAPIST PROGRESS NOTE   Session Date: 10/20/2023  Session Time: 1600  Pt contacted office on morning of appt to reschedule appt from original time of 10a, to new appt time of 4p. Pt later contacted office to notify of inabilities to make 4p scheduled appt.   Patient no-showed today's appointment; appointment was for follow up weekly therapy visit, provider notified for review of record, patient agrees to reschedule missed appointment.   Patsi Boots, MSW, LCSW 10/20/2023,  3:44 PM

## 2023-10-22 ENCOUNTER — Other Ambulatory Visit (HOSPITAL_COMMUNITY): Payer: Self-pay | Admitting: Psychiatry

## 2023-10-22 DIAGNOSIS — F3181 Bipolar II disorder: Secondary | ICD-10-CM

## 2023-10-22 DIAGNOSIS — F4310 Post-traumatic stress disorder, unspecified: Secondary | ICD-10-CM

## 2023-10-22 DIAGNOSIS — F419 Anxiety disorder, unspecified: Secondary | ICD-10-CM

## 2023-10-28 ENCOUNTER — Encounter (HOSPITAL_COMMUNITY): Payer: Self-pay

## 2023-10-28 ENCOUNTER — Ambulatory Visit (INDEPENDENT_AMBULATORY_CARE_PROVIDER_SITE_OTHER): Payer: Self-pay | Admitting: Licensed Clinical Social Worker

## 2023-10-28 DIAGNOSIS — Z91199 Patient's noncompliance with other medical treatment and regimen due to unspecified reason: Secondary | ICD-10-CM

## 2023-10-28 NOTE — Progress Notes (Signed)
 THERAPIST PROGRESS NOTE   Session Date: 10/28/2023  Session Time: 1400  Pt contacted office after 1300 to notify of inabilities to make 1400 scheduled appt.   Patient no-showed today's appointment; appointment was for follow up weekly therapy visit, provider notified for review of record, patient agrees to reschedule missed appointment.  All future appts have been cancelled due to 2x consecutive same day cancellations/no-shows, pending scheduling of future visits by pt.   Patsi Boots, MSW, LCSW 10/28/2023,  1:19 PM

## 2023-10-29 ENCOUNTER — Ambulatory Visit (HOSPITAL_COMMUNITY): Admitting: Licensed Clinical Social Worker

## 2023-11-03 ENCOUNTER — Ambulatory Visit (HOSPITAL_COMMUNITY): Admitting: Licensed Clinical Social Worker

## 2023-11-04 ENCOUNTER — Ambulatory Visit (INDEPENDENT_AMBULATORY_CARE_PROVIDER_SITE_OTHER): Admitting: Licensed Clinical Social Worker

## 2023-11-04 DIAGNOSIS — F4310 Post-traumatic stress disorder, unspecified: Secondary | ICD-10-CM

## 2023-11-04 DIAGNOSIS — F3181 Bipolar II disorder: Secondary | ICD-10-CM | POA: Diagnosis not present

## 2023-11-04 NOTE — Progress Notes (Signed)
 THERAPIST PROGRESS NOTE   Session Date: 11/04/2023  Session Time: 1000 - 1055 Virtual Visit via Video Note  I connected with Nicole Wallace on 11/04/23 at  8:00 AM EDT by a video enabled telemedicine application and verified that I am speaking with the correct person using two identifiers.  Location: Patient: Home Provider: Home Office   I discussed the limitations of evaluation and management by telemedicine and the availability of in person appointments. The patient expressed understanding and agreed to proceed.  The patient was advised to call back or seek an in-person evaluation if the symptoms worsen or if the condition fails to improve as anticipated.  I provided 54 minutes of non-face-to-face time during this encounter.  Participation Level: Active  Behavioral Response: Well GroomedAlertEuthymic  Type of Therapy: Individual Therapy  Treatment Goals addressed:   Progressing (3) LTG: Increase coping skills to manage depression and improve ability to perform daily activities (OP Depression) STG: Nicole Wallace will identify cognitive patterns and beliefs that support depression (OP Depression) LTG: Be able to talk through experiences without having an increased emotional response (OP Depression)  Completed/Met (3)  ProgressTowards Goals: Progressing  Interventions: CBT, Motivational Interviewing, and Supportive  Summary: Nicole Wallace is a 43 y.o. female with past psych history of Bipolar 2, PTSD, and Anxiety, presenting for follow-up therapy session in efforts to improve management of presenting sxs.   Patient actively engaged in session, presenting in overall pleasant moods, with periods of irritability and anxiousness surrounding recent events, and flat affect throughout duration of visit. 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 Detailed having secured second job to pay parents off, pay owed taxes, and save money. Actively engaged in reassessing presenting anxious or depressive sxs via GAD-7 and PHQ-9, further processing reductions in scores. Shared of having been sleeping w/o trazadone, no issues w/ eating habits but continued weight gain. Reports of having noticed increased anger over recent weeks with starting new job and feeling constantly micro-managed. Explored pt's attempts at self-care, and importance of prioritizing activities to enable self disengage from work.  Patient responded well to interventions. Patient continues to meet criteria for Bipolar 2 and PTSD, and Anxiety. Patient will continue to benefit from engagement in outpatient therapy due to being the least restrictive service to meet presenting needs.      11/04/2023    8:21 AM 10/13/2023   10:51 AM 09/08/2023    3:54 PM 08/25/2023    4:28 PM  GAD 7 : Generalized Anxiety Score  Nervous, Anxious, on Edge 2 1 0 0  Control/stop worrying 0 1 0 0  Worry too much - different things 0 1 0 0  Trouble relaxing 0 1 0 0  Restless 0 1 0 0  Easily annoyed or irritable 0 1 0 0  Afraid - awful might happen 0 0 0 0  Total GAD 7 Score 2 6 0 0  Anxiety Difficulty Not difficult at all Not difficult at all  Not difficult at all      11/04/2023    8:28 AM 10/13/2023   10:53 AM 09/08/2023    3:55 PM 08/25/2023    4:30 PM 08/11/2023    4:35 PM  Depression screen PHQ 2/9  Decreased Interest 0 2 0 0 1  Down, Depressed, Hopeless 0 1 0 0 1  PHQ -  2 Score 0 3 0 0 2  Altered sleeping 0 0 0 1 0  Tired, decreased energy 0 0 0 0 0  Change in appetite 0 0 0 0 0  Feeling bad or failure about yourself  0 1 0 0 1  Trouble concentrating 0 1 2 0 0  Moving slowly or fidgety/restless 0 0 0 0 0  Suicidal thoughts 0 0 0 0 1  PHQ-9 Score 0 5 2 1 4   Difficult doing work/chores  Not difficult at all Somewhat  difficult Not difficult at all Very difficult    Suicidal/Homicidal: No; Without plan, intent, or means.   Therapist Response: Clinician utilized CBT, MI, and supportive reflection techniques to address and support pt in navigating presenting stressors and sxs.  Clinician actively greeted pt upon joining virtual visit, actively engaging patient in introductory check-in, assessing presenting moods and affect, further encouraging patient's recounts of the events of the past 3 weeks, and factors contributing to presenting moods.  Actively listened to the recounts of events, providing support and validation of the patient's identified increased stressors, and thoughts and feelings related to such.  Utilized open-ended questions to support patient and greater reflection of events of the past 3 weeks, and processing of thoughts, feelings, and perspectives surrounding feeling overwhelmed with having secured new job, and being increasingly busy.  Further supported patient in exploring individual efforts at self-care, processing various to such, and encouraging importance of engaging in activities for self to support and healthy work life balance.  Clinician reassessed severity of presenting sxs, and presence of any safety concerns. Therapist provided support and empathy to patient during session.  Plan: Return again in 2 weeks.  Diagnosis:  Encounter Diagnoses  Name Primary?   Bipolar 2 disorder (HCC) Yes   PTSD (post-traumatic stress disorder)       Collaboration of Care: Psychiatrist AEB provider notes available in EHR.  Patient/Guardian was advised Release of Information must be obtained prior to any record release in order to collaborate their care with an outside provider. Patient/Guardian was advised if they have not already done so to contact the registration department to sign all necessary forms in order for us  to release information regarding their care.   Consent: Patient/Guardian gives  verbal consent for treatment and assignment of benefits for services provided during this visit. Patient/Guardian expressed understanding and agreed to proceed.   Nicole Wallace, MSW, LCSW 11/04/2023,  8:31 AM

## 2023-11-11 ENCOUNTER — Ambulatory Visit (HOSPITAL_COMMUNITY): Admitting: Licensed Clinical Social Worker

## 2023-11-17 ENCOUNTER — Ambulatory Visit (HOSPITAL_COMMUNITY): Admitting: Licensed Clinical Social Worker

## 2023-11-18 ENCOUNTER — Ambulatory Visit (HOSPITAL_COMMUNITY): Admitting: Licensed Clinical Social Worker

## 2023-11-24 ENCOUNTER — Encounter (HOSPITAL_COMMUNITY): Payer: Self-pay | Admitting: Psychiatry

## 2023-11-24 ENCOUNTER — Ambulatory Visit (HOSPITAL_COMMUNITY): Admitting: Licensed Clinical Social Worker

## 2023-11-24 ENCOUNTER — Telehealth (HOSPITAL_BASED_OUTPATIENT_CLINIC_OR_DEPARTMENT_OTHER): Admitting: Psychiatry

## 2023-11-24 DIAGNOSIS — F3181 Bipolar II disorder: Secondary | ICD-10-CM | POA: Diagnosis not present

## 2023-11-24 DIAGNOSIS — F419 Anxiety disorder, unspecified: Secondary | ICD-10-CM | POA: Diagnosis not present

## 2023-11-24 DIAGNOSIS — F4310 Post-traumatic stress disorder, unspecified: Secondary | ICD-10-CM | POA: Diagnosis not present

## 2023-11-24 MED ORDER — REXULTI 0.5 MG PO TABS: 0.5000 mg | ORAL_TABLET | Freq: Every day | ORAL | 2 refills | Status: DC

## 2023-11-24 MED ORDER — TOPIRAMATE 50 MG PO TABS: 150.0000 mg | ORAL_TABLET | Freq: Every day | ORAL | 0 refills | Status: DC

## 2023-11-24 MED ORDER — BUPROPION HCL ER (XL) 150 MG PO TB24: 150.0000 mg | ORAL_TABLET | Freq: Every day | ORAL | 0 refills | Status: DC
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
## 2023-11-25 ENCOUNTER — Ambulatory Visit (HOSPITAL_COMMUNITY): Admitting: Licensed Clinical Social Worker

## 2023-11-25 DIAGNOSIS — F3181 Bipolar II disorder: Secondary | ICD-10-CM

## 2023-11-25 DIAGNOSIS — F419 Anxiety disorder, unspecified: Secondary | ICD-10-CM

## 2023-11-25 DIAGNOSIS — F4310 Post-traumatic stress disorder, unspecified: Secondary | ICD-10-CM

## 2023-11-25 NOTE — Progress Notes (Signed)
 THERAPIST PROGRESS NOTE   Session Date: 11/25/2023  Session Time: 0805 - 0905  Participation Level: Active  Behavioral Response: Well GroomedAlertAnxious and Irritable  Type of Therapy: Individual Therapy  Treatment Goals addressed:   Progressing (3) LTG: Increase coping skills to manage depression and improve ability to perform daily activities (OP Depression) STG: Nicole Wallace will identify cognitive patterns and beliefs that support depression (OP Depression) LTG: Be able to talk through experiences without having an increased emotional response (OP Depression)  Completed/Met (3)  ProgressTowards Goals: Progressing  Interventions: CBT, Motivational Interviewing, and Supportive  Summary: Nicole Wallace is a 43 y.o. female with past psych history of Bipolar 2, PTSD, and Anxiety, presenting for follow-up therapy session in efforts to improve management of presenting sxs.   Patient actively engaged in session, presenting in pleasant moods, with periods of irritability and anxiousness surrounding recent events, and flat affect throughout duration of visit. Pt actively engaged in introductory check-in, sharing of It's been stressful, further detailing of ongoing stress surrounding new job, unrealistic expectations and understandings surrounding projects lacking necessary data, leading self to have thoughts surrounding leaving for lower paying job. Further reflected on job expectations to be potential growing pains and adjustments. Reflected of noticing increased irritability, and decreased patience level proving noticeable to mother. Explored pt's efforts at managing stressors, and challenges in engaging in self-care. Shared details of med man visit with Arfeen, MD yesterday, noting of continued increase weight gain over recent months with Abilify , changing to Rexulti  beginning today. Reassessed presenting depressive and anxious sxs via PHQ-9 and GAD-7, noting of increases in both sxs screenings to  mild ranges, processing individual observances in variances in sxs.  Patient responded well to interventions. Patient continues to meet criteria for Bipolar 2 and PTSD, and Anxiety. Patient will continue to benefit from engagement in outpatient therapy due to being the least restrictive service to meet presenting needs.      11/25/2023    8:55 AM 11/04/2023    8:21 AM 10/13/2023   10:51 AM 09/08/2023    3:54 PM  GAD 7 : Generalized Anxiety Score  Nervous, Anxious, on Edge 1 2 1  0  Control/stop worrying 1 0 1 0  Worry too much - different things 1 0 1 0  Trouble relaxing 1 0 1 0  Restless 1 0 1 0  Easily annoyed or irritable 1 0 1 0  Afraid - awful might happen 0 0 0 0  Total GAD 7 Score 6 2 6  0  Anxiety Difficulty Not difficult at all Not difficult at all Not difficult at all       11/25/2023    9:00 AM 11/04/2023    8:28 AM 10/13/2023   10:53 AM 09/08/2023    3:55 PM 08/25/2023    4:30 PM  Depression screen PHQ 2/9  Decreased Interest 1 0 2 0 0  Down, Depressed, Hopeless 0 0 1 0 0  PHQ - 2 Score 1 0 3 0 0  Altered sleeping 1 0 0 0 1  Tired, decreased energy 1 0 0 0 0  Change in appetite 0 0 0 0 0  Feeling bad or failure about yourself  0 0 1 0 0  Trouble concentrating 1 0 1 2 0  Moving slowly or fidgety/restless 1 0 0 0 0  Suicidal thoughts 0 0 0 0 0  PHQ-9 Score 5 0 5 2 1   Difficult doing work/chores Somewhat difficult  Not difficult at all Somewhat difficult Not difficult at all  Suicidal/Homicidal: No; Without plan, intent, or means.   Therapist Response: Clinician utilized CBT, MI, and supportive reflection techniques to address and support pt in navigating presenting stressors and sxs.  Clinician actively greeted pt upon joining virtual visit, assessing presenting moods and affect, and engaging in introductory check-in. Actively elicited pt's recounts of events of the past 3 weeks, utilizing open ended questions to further evoke pt's thoughts, feelings and perspectives  surrounding workplace stress, variances in stress, and individual efforts at managing such stressors. Actively listened to pt's reports of continued challenges surrounding workplace stress and unrealistic expectations. Utilized Psychoeducational, CBT, and MI interventions to support pt in processing perspectives and potential approaches to navigating workplace expectations. Provided support and validation for pt's expressed thoughts and feelings surrounding presenting stressors. Reassessed presenting depressive and anxious sxs via PHQ-9 and GAD-7, further engaging pt in processing of thoughts, feelings, and perspectives relating to observed variances in sxs experienced over recent weeks.   Clinician reassessed severity of presenting sxs, and presence of any safety concerns. Therapist provided support and empathy to patient during session.  Plan: Return again in 2 weeks.  Diagnosis:  Encounter Diagnoses  Name Primary?   Bipolar 2 disorder (HCC) Yes   Anxiety    PTSD (post-traumatic stress disorder)    Collaboration of Care: Psychiatrist AEB provider notes available in EHR.  Patient/Guardian was advised Release of Information must be obtained prior to any record release in order to collaborate their care with an outside provider. Patient/Guardian was advised if they have not already done so to contact the registration department to sign all necessary forms in order for us  to release information regarding their care.   Consent: Patient/Guardian gives verbal consent for treatment and assignment of benefits for services provided during this visit. Patient/Guardian expressed understanding and agreed to proceed.  Virtual Visit via Video Note  I connected with Nicole Wallace on 11/25/23 at  8:00 AM EDT by a video enabled telemedicine application and verified that I am speaking with the correct person using two identifiers.  Location: Patient: Home Provider: Home Office   I discussed the  limitations of evaluation and management by telemedicine and the availability of in person appointments. The patient expressed understanding and agreed to proceed.  The patient was advised to call back or seek an in-person evaluation if the symptoms worsen or if the condition fails to improve as anticipated.  I provided 60 minutes of non-face-to-face time during this encounter.  Nicole Wallace, MSW, LCSW 11/25/2023,  8:59 AM

## 2023-11-28 ENCOUNTER — Telehealth (HOSPITAL_COMMUNITY): Payer: Self-pay

## 2023-11-28 NOTE — Telephone Encounter (Signed)
 Patients Rexulti  approved for one year. PA# [id number]

## 2023-12-01 ENCOUNTER — Ambulatory Visit (HOSPITAL_COMMUNITY): Admitting: Licensed Clinical Social Worker

## 2023-12-08 ENCOUNTER — Ambulatory Visit (HOSPITAL_COMMUNITY): Admitting: Licensed Clinical Social Worker

## 2023-12-09 ENCOUNTER — Ambulatory Visit (INDEPENDENT_AMBULATORY_CARE_PROVIDER_SITE_OTHER): Admitting: Licensed Clinical Social Worker

## 2023-12-09 DIAGNOSIS — F3181 Bipolar II disorder: Secondary | ICD-10-CM

## 2023-12-09 DIAGNOSIS — F419 Anxiety disorder, unspecified: Secondary | ICD-10-CM

## 2023-12-09 DIAGNOSIS — F4310 Post-traumatic stress disorder, unspecified: Secondary | ICD-10-CM

## 2023-12-09 NOTE — Progress Notes (Signed)
 THERAPIST PROGRESS NOTE   Session Date: 12/09/2023  Session Time: 0804 - 0910  Participation Level: Active  Behavioral Response: Well GroomedAlertEuthymic  Type of Therapy: Individual Therapy  Treatment Goals addressed:   Progressing (3) LTG: Increase coping skills to manage depression and improve ability to perform daily activities (OP Depression) STG: Nicole Wallace will identify cognitive patterns and beliefs that support depression (OP Depression) LTG: Be able to talk through experiences without having an increased emotional response (OP Depression)  Completed/Met (3)  ProgressTowards Goals: Progressing  Interventions: CBT, Motivational Interviewing, and Supportive  Summary: Nicole Wallace is a 43 y.o. female with past psych history of Bipolar 2, PTSD, and Anxiety, presenting for follow-up therapy session in efforts to improve management of presenting sxs.   Patient actively engaged in session, presenting in pleasant moods, with periods of irritability and anxiousness surrounding recent events, and flat affect throughout duration of visit. 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 Engaged in reassessing presenting depressive and anxious sxs experienced over recent weeks, reflecting on reduction in depressive and anxious sxs, further detailing to be sleeping fine, having not taken sleep medications for approx 25mo, discontinued the Abilify  for approx. 1 week before being able to obtain and begin taking Rexulti  due to having only obtained medication last week due to insurance denial, having lost 2lb over recent weeks, increasing activity by walking dogs, and overall improvements in moods. Pt shared of having started picking up the free books at bookstore up the street, finding increased interest in reading again and intent to begin  attempts to begin reading regularly. Pt shared of wanting to talk about something she thinks really triggers issues, noting of challenges and triggers being primarily surrounding men, reflecting on hx of beginning taking medications and increased challenges, having to compromise and/or change self in relationships being primary challenges leading to increased stress, citing instances of INPT admission in 2016 and other instances of which pt becomes dysregulated being impacted by negative or challenging interactions with men, actively processing individual approaches to relationships, societal factors, and importance of complementing one another in relationships versus compromising to settle/accept less than.  Patient responded well to interventions. Patient continues to meet criteria for Bipolar 2 and PTSD, and Anxiety. Patient will continue to benefit from engagement in outpatient therapy due to being the least restrictive service to meet presenting needs.      12/09/2023    8:13 AM 11/25/2023    8:55 AM 11/04/2023    8:21 AM 10/13/2023   10:51 AM  GAD 7 : Generalized Anxiety Score  Nervous, Anxious, on Edge 0 1 2 1   Control/stop worrying 0 1 0 1  Worry too much - different things 0 1 0 1  Trouble relaxing 0 1 0 1  Restless 0 1 0 1  Easily annoyed or irritable 1 1 0 1  Afraid - awful might happen 0 0 0 0  Total GAD 7 Score 1 6 2 6   Anxiety Difficulty Not difficult at all Not difficult at all Not difficult at all Not difficult at all      12/09/2023    8:16 AM 11/25/2023    9:00 AM 11/04/2023    8:28 AM 10/13/2023   10:53 AM 09/08/2023    3:55 PM  Depression screen PHQ 2/9  Decreased Interest 0 1 0 2 0  Down, Depressed, Hopeless 0  0 0 1 0  PHQ - 2 Score 0 1 0 3 0  Altered sleeping 0 1 0 0 0  Tired, decreased energy 0 1 0 0 0  Change in appetite 0 0 0 0 0  Feeling bad or failure about yourself  0 0 0 1 0  Trouble concentrating 0 1 0 1 2  Moving slowly or fidgety/restless 0 1 0 0 0  Suicidal  thoughts 0 0 0 0 0  PHQ-9 Score 0 5 0 5 2  Difficult doing work/chores  Somewhat difficult  Not difficult at all Somewhat difficult    Suicidal/Homicidal: No; Without plan, intent, or means.   Therapist Response: Clinician utilized CBT, MI, and supportive reflection techniques to address and support pt in navigating presenting stressors and sxs.  Clinician actively greeted patient upon joining virtual visit, assessing presenting moods and affect, and engaging in introductory check-in. Openly engaged patient in introductory check-in, prompting recounts of events over recent weeks, transitions, and factors contributing to presenting moods. Utilized open-ended questions in order to elicit patient's thoughts and perspectives surrounding transition and employment. Actively listened to patient's recounts of recent events, separating from part-time work with friend, and exploring impact on morning stress levels. Actively engaged in reassessing presenting depressive and anxious symptoms via PHQ-9 and GAD-7, further processing reductions in both areas of presenting symptoms, and supporting patient in processing individual efforts and improving management of stressors. Provided psychoeducational interventions and supporting patient in processing thoughts and perspectives surrounding presenting challenges in relation to interactions or relationships with men, processing social factors, societal expectations, and variances in social norms.   Clinician reassessed severity of presenting sxs, and presence of any safety concerns. Therapist provided support and empathy to patient during session.  Plan: Return again in 3 weeks.  Diagnosis:  Encounter Diagnoses  Name Primary?   Bipolar 2 disorder (HCC) Yes   PTSD (post-traumatic stress disorder)    Anxiety     Collaboration of Care: Psychiatrist AEB provider notes available in EHR.  Patient/Guardian was advised Release of Information must be obtained  prior to any record release in order to collaborate their care with an outside provider. Patient/Guardian was advised if they have not already done so to contact the registration department to sign all necessary forms in order for us  to release information regarding their care.   Consent: Patient/Guardian gives verbal consent for treatment and assignment of benefits for services provided during this visit. Patient/Guardian expressed understanding and agreed to proceed.  Virtual Visit via Video Note  I connected with Nicole Wallace on 12/09/23 at  8:00 AM EDT by a video enabled telemedicine application and verified that I am speaking with the correct person using two identifiers.  Location: Patient: Home Provider: Home Office   I discussed the limitations of evaluation and management by telemedicine and the availability of in person appointments. The patient expressed understanding and agreed to proceed.  I discussed the assessment and treatment plan with the patient. The patient was provided an opportunity to ask questions and all were answered. The patient agreed with the plan and demonstrated an understanding of the instructions.   The patient was advised to call back or seek an in-person evaluation if the symptoms worsen or if the condition fails to improve as anticipated.  I provided 65 minutes of non-face-to-face time during this encounter.   Lynwood JONETTA Maris, MSW, LCSW 12/09/2023,  8:24 AM

## 2023-12-16 ENCOUNTER — Ambulatory Visit (HOSPITAL_COMMUNITY): Admitting: Licensed Clinical Social Worker

## 2023-12-30 ENCOUNTER — Ambulatory Visit (INDEPENDENT_AMBULATORY_CARE_PROVIDER_SITE_OTHER): Admitting: Licensed Clinical Social Worker

## 2023-12-30 DIAGNOSIS — F3181 Bipolar II disorder: Secondary | ICD-10-CM | POA: Diagnosis not present

## 2023-12-30 NOTE — Progress Notes (Signed)
 THERAPIST PROGRESS NOTE   Session Date: 12/30/2023  Session Time: 0804 - 0900  Participation Level: Active  Behavioral Response: Well GroomedAlertEuthymic  Type of Therapy: Individual Therapy  Treatment Goals addressed:   Progressing (3) LTG: Increase coping skills to manage depression and improve ability to perform daily activities (OP Depression) STG: Nicole Wallace will identify cognitive patterns and beliefs that support depression (OP Depression) LTG: Be able to talk through experiences without having an increased emotional response (OP Depression)  Completed/Met (3)  ProgressTowards Goals: Progressing  Interventions: CBT, Motivational Interviewing, and Supportive  Summary: Nicole Wallace is a 43 y.o. female with past psych history of Bipolar 2, PTSD, and Anxiety, presenting for follow-up therapy session in efforts to improve management of presenting sxs.   Patient actively engaged in session, presenting in overall pleasant moods with congruent affect. Pt actively engaged in introductory check-in, sharing of frustrations surrounding work responsibilities/requirements proving to be increasingly stressful and finding to be frustrated. Pt shared of individual efforts towards navigating work stressors, sharing of being direct and vocal about concerns within the workplace, further explored work related stress and possibilities to delegate responsibilities which pt determined to be unfeasible. Pt further engaged in processing feelings surrounding challenges in relationship and irrational thoughts of her not being built for relationships, further detailing of finding self ending relationships at the first instance of adversity, continuing to share of current partner having expressed needs for help in supporting mother, which pt shared to be frustrated by, engaging further in cultural factors that prove to impact individuals approaches to caring for/supporting parents, further exploring cultural  implications on all relationships, and how these prove to vary from race, ethnicity, nationality, religion, region, and family of origin. Explored pt's perspectives surrounding compromise and complementary factors within relationships, and pt hx of challenges in flexibility within relationships.  Patient responded well to interventions. Patient continues to meet criteria for Bipolar 2 and PTSD, and Anxiety. Patient will continue to benefit from engagement in outpatient therapy due to being the least restrictive service to meet presenting needs.      12/09/2023    8:13 AM 11/25/2023    8:55 AM 11/04/2023    8:21 AM 10/13/2023   10:51 AM  GAD 7 : Generalized Anxiety Score  Nervous, Anxious, on Edge 0 1 2 1   Control/stop worrying 0 1 0 1  Worry too much - different things 0 1 0 1  Trouble relaxing 0 1 0 1  Restless 0 1 0 1  Easily annoyed or irritable 1 1 0 1  Afraid - awful might happen 0 0 0 0  Total GAD 7 Score 1 6 2 6   Anxiety Difficulty Not difficult at all Not difficult at all Not difficult at all Not difficult at all      12/09/2023    8:16 AM 11/25/2023    9:00 AM 11/04/2023    8:28 AM 10/13/2023   10:53 AM 09/08/2023    3:55 PM  Depression screen PHQ 2/9  Decreased Interest 0 1 0 2 0  Down, Depressed, Hopeless 0 0 0 1 0  PHQ - 2 Score 0 1 0 3 0  Altered sleeping 0 1 0 0 0  Tired, decreased energy 0 1 0 0 0  Change in appetite 0 0 0 0 0  Feeling bad or failure about yourself  0 0 0 1 0  Trouble concentrating 0 1 0 1 2  Moving slowly or fidgety/restless 0 1 0 0 0  Suicidal thoughts 0 0  0 0 0  PHQ-9 Score 0 5 0 5 2  Difficult doing work/chores  Somewhat difficult  Not difficult at all Somewhat difficult    Suicidal/Homicidal: No; Without plan, intent, or means.   Therapist Response: Clinician utilized CBT, MI, and supportive reflection techniques to address and support pt in navigating presenting stressors and sxs.  Clinician actively greeted patient upon joining virtual visit,  assessing presenting moods and affect. Openly engaged patient in check-in, exploring pt's continued efforts towards navigating work related stressors, and prompting recounts of events of recent weeks. Utilized open-ended questions to evoke thoughts and feelings surrounding work related stressors, processing individual management of such. Actively listened to patient's recounts of work related stressors and reduction of concerns with improved abilities at managing stress. Actively listened to pt's reports of recent increased stress surrounding romantic relationship, challenging pt's thoughts and reframing perspectives in support of processing compromises and complementary traits within relationships. Utilized CBT, psychoeducational and MI interventions in supporting patient in processing thoughts and perspectives surrounding presenting challenges.  Clinician reassessed severity of presenting sxs, and presence of any safety concerns. Therapist provided support and empathy to patient during session.  Plan: Return again in 3 weeks.  Diagnosis:  Encounter Diagnosis  Name Primary?   Bipolar 2 disorder (HCC) Yes    Collaboration of Care: Psychiatrist AEB provider notes available in EHR.  Patient/Guardian was advised Release of Information must be obtained prior to any record release in order to collaborate their care with an outside provider. Patient/Guardian was advised if they have not already done so to contact the registration department to sign all necessary forms in order for us  to release information regarding their care.   Consent: Patient/Guardian gives verbal consent for treatment and assignment of benefits for services provided during this visit. Patient/Guardian expressed understanding and agreed to proceed.  Virtual Visit via Video Note  I connected with Nicole Wallace on 12/30/23 at  8:00 AM EDT by a video enabled telemedicine application and verified that I am speaking with the  correct person using two identifiers.  Location: Patient: Home Provider: Home Office   I discussed the limitations of evaluation and management by telemedicine and the availability of in person appointments. The patient expressed understanding and agreed to proceed.  I discussed the assessment and treatment plan with the patient. The patient was provided an opportunity to ask questions and all were answered. The patient agreed with the plan and demonstrated an understanding of the instructions.   The patient was advised to call back or seek an in-person evaluation if the symptoms worsen or if the condition fails to improve as anticipated.  I provided 56 minutes of non-face-to-face time during this encounter.   Lynwood JONETTA Maris, MSW, LCSW 12/30/2023,  8:06 AM

## 2024-01-07 ENCOUNTER — Other Ambulatory Visit (HOSPITAL_COMMUNITY): Payer: Self-pay | Admitting: Psychiatry

## 2024-01-07 DIAGNOSIS — F3181 Bipolar II disorder: Secondary | ICD-10-CM

## 2024-01-08 ENCOUNTER — Other Ambulatory Visit (HOSPITAL_COMMUNITY): Payer: Self-pay

## 2024-01-08 DIAGNOSIS — F3181 Bipolar II disorder: Secondary | ICD-10-CM

## 2024-01-08 DIAGNOSIS — F419 Anxiety disorder, unspecified: Secondary | ICD-10-CM

## 2024-01-08 MED ORDER — BUPROPION HCL ER (XL) 150 MG PO TB24: 150.0000 mg | ORAL_TABLET | Freq: Every day | ORAL | 0 refills | Status: DC

## 2024-01-08 MED ORDER — TOPIRAMATE 50 MG PO TABS: 150.0000 mg | ORAL_TABLET | Freq: Every day | ORAL | 0 refills | Status: DC

## 2024-01-13 ENCOUNTER — Ambulatory Visit (HOSPITAL_COMMUNITY): Admitting: Licensed Clinical Social Worker

## 2024-01-15 ENCOUNTER — Ambulatory Visit (HOSPITAL_COMMUNITY): Admitting: Licensed Clinical Social Worker

## 2024-01-15 DIAGNOSIS — F3181 Bipolar II disorder: Secondary | ICD-10-CM | POA: Diagnosis not present

## 2024-01-15 DIAGNOSIS — F419 Anxiety disorder, unspecified: Secondary | ICD-10-CM

## 2024-01-15 DIAGNOSIS — F4310 Post-traumatic stress disorder, unspecified: Secondary | ICD-10-CM

## 2024-01-15 NOTE — Progress Notes (Signed)
 THERAPIST PROGRESS NOTE   Session Date: 01/15/2024  Session Time: 0915 - 0959  Participation Level: Active  Behavioral Response: Well GroomedAlertEuthymic  Type of Therapy: Individual Therapy  Treatment Goals addressed:   Progressing (3) LTG: Increase coping skills to manage depression and improve ability to perform daily activities (OP Depression) STG: Nicole Wallace will identify cognitive patterns and beliefs that support depression (OP Depression) LTG: Be able to talk through experiences without having an increased emotional response (OP Depression)  Completed/Met (3)  ProgressTowards Goals: Progressing  Interventions: CBT, Motivational Interviewing, and Supportive  Summary: Nicole Wallace is a 43 y.o. female with past psych history of Bipolar 2, PTSD, and Anxiety, presenting for follow-up therapy session in efforts to improve management of presenting sxs.   Patient actively engaged in session, presenting in overall pleasant moods with congruent affect. Pt actively engaged in introductory check-in, sharing of having Been in a manic episode and around my mom and trying to not be so irritable, further detailing of having attempted to engage in various activities in order to manage dysregulation such as spin cycling, hot bath, incense, hot tea, to little effectiveness. Pt further detailed of having found self feeling increasingly irritable and tense for 2-3 days, processing stressors and contributing factors, sharing of having realized to have missed a couple days of Wellbutrin , processing increased irritability and variances in moods to be a potential s/e of missed doses. 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 Actively engaged in reassessing presenting depressive and anxious sxs observed over the past two weeks, processing continued minimal score ranges and reduced stress.  Patient responded well to interventions. Patient continues to meet criteria for Bipolar 2 and PTSD, and Anxiety. Patient will continue to benefit from engagement in outpatient therapy due to being the least restrictive service to meet presenting needs.      01/15/2024    9:57 AM 12/09/2023    8:13 AM 11/25/2023    8:55 AM 11/04/2023    8:21 AM  GAD 7 : Generalized Anxiety Score  Nervous, Anxious, on Edge 1 0 1 2  Control/stop worrying 1 0 1 0  Worry too much - different things 1 0 1 0  Trouble relaxing 1 0 1 0  Restless 0 0 1 0  Easily annoyed or irritable 1 1 1  0  Afraid - awful might happen 0 0 0 0  Total GAD 7 Score 5 1 6 2   Anxiety Difficulty Somewhat difficult Not difficult at all Not difficult at all Not difficult at all      01/15/2024    9:52 AM 12/09/2023    8:16 AM 11/25/2023    9:00 AM 11/04/2023    8:28 AM 10/13/2023   10:53 AM  Depression screen PHQ 2/9  Decreased Interest 1 0 1 0 2  Down, Depressed, Hopeless 0 0 0 0 1  PHQ - 2 Score 1 0 1 0 3  Altered sleeping 0 0 1 0 0  Tired, decreased energy 0 0 1 0  0  Change in appetite 0 0 0 0 0  Feeling bad or failure about yourself  0 0 0 0 1  Trouble concentrating 1 0 1 0 1  Moving slowly or fidgety/restless 0 0 1 0 0  Suicidal thoughts 0 0 0 0 0  PHQ-9 Score 2 0 5 0 5  Difficult doing work/chores Somewhat difficult  Somewhat difficult  Not difficult at all    Suicidal/Homicidal: No; Without plan, intent, or  means.   Therapist Response: Clinician utilized CBT, MI, and supportive reflection techniques to address and support pt in navigating presenting stressors and sxs.  Clinician actively greeted patient upon joining virtual visit, assessing presenting moods and affect. Openly engaged patient in check-in, utilizing open ended questions in aims of eliciting recounts of events of recent weeks, recent challenges, and factors contributing to presenting moods.  Utilized active listening techniques to provide support to pt in reflections of events, validating shared thoughts, feelings, and perspectives. Supported pt in processing individual efforts at managing sxs and challenges, guiding pt in challenging irrational thoughts, and identifying realistic thought patterns surrounding challenges based on available evidence. Utilized CBT, psychoeducational and MI interventions in supporting patient in processing thoughts and perspectives surrounding difficulties, and redirecting thoughts in aims of supporting pt in processing increased anxiousness.  Clinician reassessed severity of presenting sxs, and presence of any safety concerns. Therapist provided support and empathy to patient during session.  Plan: Return again in 2 weeks.  Diagnosis:  Encounter Diagnoses  Name Primary?   Bipolar 2 disorder (HCC) Yes   Anxiety    PTSD (post-traumatic stress disorder)     Collaboration of Care: Psychiatrist AEB provider notes available in EHR.  Patient/Guardian was advised Release of Information must be obtained prior to any record release in order to collaborate their care with an outside provider. Patient/Guardian was advised if they have not already done so to contact the registration department to sign all necessary forms in order for us  to release information regarding their care.   Consent: Patient/Guardian gives verbal consent for treatment and assignment of benefits for services provided during this visit.  Patient/Guardian expressed understanding and agreed to proceed.  Virtual Visit via Video Note  I connected with Nicole Wallace on 01/15/24 at 0915 by a video enabled telemedicine application and verified that I am speaking with the correct person using two identifiers.  Location: Patient: Home Provider: [facility]   I discussed the limitations of evaluation and management by telemedicine and the availability of in person appointments. The patient expressed understanding and agreed to proceed.  I discussed the assessment and treatment plan with the patient. The patient was provided an opportunity to ask questions and all were answered. The patient agreed with the plan and demonstrated an understanding of the instructions.   The patient was advised to call back or seek an in-person evaluation if the symptoms worsen or if the condition fails to improve as anticipated.  I provided 44 minutes of non-face-to-face time during this encounter.   Lynwood JONETTA Maris, MSW, LCSW 01/15/2024,  9:58 AM

## 2024-01-26 ENCOUNTER — Ambulatory Visit (INDEPENDENT_AMBULATORY_CARE_PROVIDER_SITE_OTHER): Admitting: Licensed Clinical Social Worker

## 2024-01-26 DIAGNOSIS — F3181 Bipolar II disorder: Secondary | ICD-10-CM

## 2024-01-26 DIAGNOSIS — F4310 Post-traumatic stress disorder, unspecified: Secondary | ICD-10-CM

## 2024-01-26 DIAGNOSIS — F419 Anxiety disorder, unspecified: Secondary | ICD-10-CM

## 2024-01-26 NOTE — Progress Notes (Signed)
 THERAPIST PROGRESS NOTE   Session Date: 01/26/2024  Session Time: 1010 - 1056  Participation Level: Active  Behavioral Response: Well GroomedAlertEuthymic  Type of Therapy: Individual Therapy  Treatment Goals addressed:   Progressing (3) LTG: Increase coping skills to manage depression and improve ability to perform daily activities (OP Depression) STG: Nicole Wallace will identify cognitive patterns and beliefs that support depression (OP Depression) LTG: Be able to talk through experiences without having an increased emotional response (OP Depression)  Completed/Met (3)  ProgressTowards Goals: Progressing  Interventions: CBT, Motivational Interviewing, and Supportive  Summary: Nicole Wallace is a 43 y.o. female with past psych history of Bipolar 2, PTSD, and Anxiety, presenting for follow-up therapy session in efforts to improve management of presenting sxs.   Patient actively engaged in session, presenting in overall pleasant moods with congruent affect. Pt actively engaged in introductory check-in, sharing of Everything's going good, further detailing of mother's surgery tomorrow proving to impact scheduling and moving visit to today, as well as individual means of navigating work related stress to accommodate mother's surgery tomorrow. Pt detailed of having been in pretty good moods, working out at home in mornings prior to work, finding exercise to help begin day. Pt further shared of being mindful of self being more cognizant in noticing situations that overwhelm her, and ensuring to live in the present and not creating overwhelming situations for self through ruminating on irrational thoughts. Actively engaged in processing examples from work hx and individual established standards of perfection that prove to impact pt's overall individual stress and how she proves to navigate challenges within the workplace and relationships. Pt actively processed how establishing standards or expectations of  perfection of self proves to impact self worth and individual view of self.    Patient responded well to interventions. Patient continues to meet criteria for Bipolar 2 and PTSD, and Anxiety. Patient will continue to benefit from engagement in outpatient therapy due to being the least restrictive service to meet presenting needs.      01/15/2024    9:57 AM 12/09/2023    8:13 AM 11/25/2023    8:55 AM 11/04/2023    8:21 AM  GAD 7 : Generalized Anxiety Score  Nervous, Anxious, on Edge 1 0 1 2  Control/stop worrying 1 0 1 0  Worry too much - different things 1 0 1 0  Trouble relaxing 1 0 1 0  Restless 0 0 1 0  Easily annoyed or irritable 1 1 1  0  Afraid - awful might happen 0 0 0 0  Total GAD 7 Score 5 1 6 2   Anxiety Difficulty Somewhat difficult Not difficult at all Not difficult at all Not difficult at all      01/15/2024    9:52 AM 12/09/2023    8:16 AM 11/25/2023    9:00 AM 11/04/2023    8:28 AM 10/13/2023   10:53 AM  Depression screen PHQ 2/9  Decreased Interest 1 0 1 0 2  Down, Depressed, Hopeless 0 0 0 0 1  PHQ - 2 Score 1 0 1 0 3  Altered sleeping 0 0 1 0 0  Tired, decreased energy 0 0 1 0 0  Change in appetite 0 0 0 0 0  Feeling bad or failure about yourself  0 0 0 0 1  Trouble concentrating 1 0 1 0 1  Moving slowly or fidgety/restless 0 0 1 0 0  Suicidal thoughts 0 0 0 0 0  PHQ-9 Score 2 0 5 0 5  Difficult doing work/chores Somewhat difficult  Somewhat difficult  Not difficult at all    Suicidal/Homicidal: No; Without plan, intent, or means.   Therapist Response: Clinician utilized CBT, MI, and supportive reflection techniques to address and support pt in navigating presenting stressors and sxs.  Clinician actively greeted patient upon joining virtual visit, assessing presenting moods and affect. Openly engaged patient in check-in, utilizing open ended questions in eliciting recounts of events of recent weeks, recent stressors, and factors contributing to presenting moods.  Utilized active listening techniques to support pt in reflections of events, validating thoughts, feelings, and perspectives, and processing individual observed improvements in management of stress. Supported pt in processing individual efforts at managing sxs and stressors, exploring identified benefits of physical exercise and adjusting perspectives to more realistic standards and expectations. Utilized CBT, psychoeducational and MI interventions in supporting patient in processing thoughts and perspectives surrounding difficulties.Clinician reassessed severity of presenting sxs, and presence of any safety concerns. Pr proves to maintain moderate progression towards identified goals.  Plan: Return again in 2 weeks.  Diagnosis:  Encounter Diagnoses  Name Primary?   Bipolar 2 disorder (HCC) Yes   Anxiety    PTSD (post-traumatic stress disorder)      Collaboration of Care: Psychiatrist AEB provider notes available in EHR.  Patient/Guardian was advised Release of Information must be obtained prior to any record release in order to collaborate their care with an outside provider. Patient/Guardian was advised if they have not already done so to contact the registration department to sign all necessary forms in order for us  to release information regarding their care.   Consent: Patient/Guardian gives verbal consent for treatment and assignment of benefits for services provided during this visit. Patient/Guardian expressed understanding and agreed to proceed.  Virtual Visit via Video Note  I connected with Nicole Wallace on 01/26/24 at 10:10AM by a video enabled telemedicine application and verified that I am speaking with the correct person using two identifiers.  Location: Patient: Home Provider: Home Office   I discussed the limitations of evaluation and management by telemedicine and the availability of in person appointments. The patient expressed understanding and agreed to proceed.  I  discussed the assessment and treatment plan with the patient. The patient was provided an opportunity to ask questions and all were answered. The patient agreed with the plan and demonstrated an understanding of the instructions.   The patient was advised to call back or seek an in-person evaluation if the symptoms worsen or if the condition fails to improve as anticipated.  I provided 45 minutes of non-face-to-face time during this encounter.   Nicole Wallace, MSW, LCSW 01/26/2024,  10:11 AM

## 2024-01-27 ENCOUNTER — Ambulatory Visit (HOSPITAL_COMMUNITY): Admitting: Licensed Clinical Social Worker

## 2024-02-09 ENCOUNTER — Ambulatory Visit (INDEPENDENT_AMBULATORY_CARE_PROVIDER_SITE_OTHER): Admitting: Licensed Clinical Social Worker

## 2024-02-09 DIAGNOSIS — F3181 Bipolar II disorder: Secondary | ICD-10-CM | POA: Diagnosis not present

## 2024-02-09 DIAGNOSIS — F419 Anxiety disorder, unspecified: Secondary | ICD-10-CM

## 2024-02-09 NOTE — Progress Notes (Signed)
 THERAPIST PROGRESS NOTE   Session Date: 02/09/2024  Session Time:  0806 - 0900  Participation Level: Active  Behavioral Response: Well GroomedAlertEuthymic and Irritable  Type of Therapy: Individual Therapy  Treatment Goals addressed:   Progressing (3) LTG: Increase coping skills to manage depression and improve ability to perform daily activities (OP Depression) STG: Nicole Wallace will identify cognitive patterns and beliefs that support depression (OP Depression) LTG: Be able to talk through experiences without having an increased emotional response (OP Depression)  Completed/Met (3)  ProgressTowards Goals: Progressing  Interventions: CBT, Motivational Interviewing, and Supportive  Summary: Nicole Wallace is a 43 y.o. female with past psych history of Bipolar 2, PTSD, and Anxiety, presenting for follow-up therapy session in efforts to improve management of presenting sxs.   Patient actively engaged in session, presenting in overall pleasant moods with congruent affect. Pt actively engaged in introductory check-in, sharing of I'm fucking pissed, further detailing of being tired and sharing of frustrations within workplace and lack of communication and awareness from leadership surrounding tasks and responsibilities. Pt further detailed newly identified stressors in the workplace, having learned of potential racial factors colleague has spoken to. Pt further shared feeling micro-managed and having to watch her back in every interaction within the workplace, processing unsettling feelings surrounding inabilities to trust company owners and approaches to management. Pt further detailed additional newly identified stress, specifically in relation to mother having returned to hospital following knee replacement surgery due to unbearable pain upon returning home, and pt needing to support mother in managing needs. Pt detailed older sister being in town to support with mother's needs, finding that they are  proving to get along. Actively explored how pt is proving to manage stressors, sharing of learning to let things go, have given things to God, and not allowing things to cause her increased stress.  Patient responded well to interventions. Patient continues to meet criteria for Bipolar 2 and PTSD, and Anxiety. Patient will continue to benefit from engagement in outpatient therapy due to being the least restrictive service to meet presenting needs.      02/09/2024    8:50 AM 01/15/2024    9:57 AM 12/09/2023    8:13 AM 11/25/2023    8:55 AM  GAD 7 : Generalized Anxiety Score  Nervous, Anxious, on Edge 1 1 0 1  Control/stop worrying 1 1 0 1  Worry too much - different things 1 1 0 1  Trouble relaxing 1 1 0 1  Restless 0 0 0 1  Easily annoyed or irritable 0 1 1 1   Afraid - awful might happen 1 0 0 0  Total GAD 7 Score 5 5 1 6   Anxiety Difficulty Not difficult at all Somewhat difficult Not difficult at all Not difficult at all      02/09/2024    8:56 AM 01/15/2024    9:52 AM 12/09/2023    8:16 AM 11/25/2023    9:00 AM 11/04/2023    8:28 AM  Depression screen PHQ 2/9  Decreased Interest 0 1 0 1 0  Down, Depressed, Hopeless 0 0 0 0 0  PHQ - 2 Score 0 1 0 1 0  Altered sleeping 0 0 0 1 0  Tired, decreased energy 0 0 0 1 0  Change in appetite 0 0 0 0 0  Feeling bad or failure about yourself  0 0 0 0 0  Trouble concentrating 0 1 0 1 0  Moving slowly or fidgety/restless 0 0 0 1 0  Suicidal  thoughts 0 0 0 0 0  PHQ-9 Score 0 2 0 5 0  Difficult doing work/chores  Somewhat difficult  Somewhat difficult     Suicidal/Homicidal: No; Without plan, intent, or means.   Therapist Response: Clinician utilized CBT, MI, and supportive reflection techniques to address and support pt in navigating presenting stressors and sxs.  Clinician actively greeted patient upon joining virtual visit, assessing presenting moods and affect, engaging patient in check-in, utilizing open ended questions in eliciting  recounts of events of recent weeks, exploring newly presenting stressors, management of ongoing stressors, and factors contributing to presenting moods. Utilized active listening techniques to support pt in reflections of events, providing support and validating identified thoughts, feelings, and perspectives, and further evoking pt's perspectives of individual efforts at managing stress. Utilized CBT, psychoeducational and MI interventions in supporting patient in processing challenges.Clinician reassessed severity of presenting sxs, and presence of any safety concerns. Pt proves to maintain moderate progress towards tx goals.  []  Cognitive Challenging [x]  Cognitive Refocusing [x]  Cognitive Reframing [x]  Communication Skills []  Compliance Issues []  DBT [x]  Exploration of Coping Patterns [x]  Exploration of Emotions [x]  Exploration of Relationship Patterns []  Guided Imagery []  Interactive Feedback []  Interpersonal Resolutions []  Mindfulness Training []  Preventative Services []  Psycho-Education []  Relaxation/Deep Breathing []  Review of Treatment Plan/Progress []  Role-Play/Behavioral Rehearsal [x]  Structured Problem Solving []  Supportive Reflection [x]  Symptom Management []  Other   Plan: Return again in 2 weeks.  Diagnosis:  Encounter Diagnoses  Name Primary?   Bipolar 2 disorder (HCC) Yes   Anxiety     Collaboration of Care: Psychiatrist AEB provider notes available in EHR.  Patient/Guardian was advised Release of Information must be obtained prior to any record release in order to collaborate their care with an outside provider. Patient/Guardian was advised if they have not already done so to contact the registration department to sign all necessary forms in order for us  to release information regarding their care.   Consent: Patient/Guardian gives verbal consent for treatment and assignment of benefits for services provided during this visit. Patient/Guardian expressed understanding and  agreed to proceed.  Virtual Visit via Video Note  I connected with Nicole Wallace on 02/09/24 at  8:00 AM EDT by a video enabled telemedicine application and verified that I am speaking with the correct person using two identifiers.  Location: Patient: Home Provider: Home Office   I discussed the limitations of evaluation and management by telemedicine and the availability of in person appointments. The patient expressed understanding and agreed to proceed.  I discussed the assessment and treatment plan with the patient. The patient was provided an opportunity to ask questions and all were answered. The patient agreed with the plan and demonstrated an understanding of the instructions.   The patient was advised to call back or seek an in-person evaluation if the symptoms worsen or if the condition fails to improve as anticipated.  I provided 53 minutes of non-face-to-face time during this encounter.  Nicole Wallace, MSW, LCSW 02/09/2024,  8:09 AM

## 2024-02-19 ENCOUNTER — Encounter (HOSPITAL_COMMUNITY): Payer: Self-pay | Admitting: Psychiatry

## 2024-02-19 ENCOUNTER — Telehealth (HOSPITAL_COMMUNITY): Admitting: Psychiatry

## 2024-02-19 VITALS — Wt 169.0 lb

## 2024-02-19 DIAGNOSIS — F4310 Post-traumatic stress disorder, unspecified: Secondary | ICD-10-CM | POA: Diagnosis not present

## 2024-02-19 DIAGNOSIS — F3181 Bipolar II disorder: Secondary | ICD-10-CM | POA: Diagnosis not present

## 2024-02-19 DIAGNOSIS — F419 Anxiety disorder, unspecified: Secondary | ICD-10-CM

## 2024-02-19 MED ORDER — HYDROXYZINE PAMOATE 25 MG PO CAPS: ORAL_CAPSULE | ORAL | 0 refills | Status: DC

## 2024-02-19 MED ORDER — REXULTI 0.5 MG PO TABS: 0.5000 mg | ORAL_TABLET | Freq: Every day | ORAL | 2 refills | Status: AC

## 2024-02-19 MED ORDER — TOPIRAMATE 50 MG PO TABS: 150.0000 mg | ORAL_TABLET | Freq: Every day | ORAL | 0 refills | Status: DC

## 2024-02-19 MED ORDER — BUPROPION HCL ER (XL) 150 MG PO TB24: 150.0000 mg | ORAL_TABLET | Freq: Every day | ORAL | 0 refills | Status: DC
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
## 2024-02-20 ENCOUNTER — Telehealth (HOSPITAL_COMMUNITY): Admitting: Psychiatry

## 2024-02-23 ENCOUNTER — Ambulatory Visit (HOSPITAL_COMMUNITY): Admitting: Licensed Clinical Social Worker

## 2024-02-23 DIAGNOSIS — F419 Anxiety disorder, unspecified: Secondary | ICD-10-CM

## 2024-02-23 DIAGNOSIS — F3181 Bipolar II disorder: Secondary | ICD-10-CM

## 2024-02-23 NOTE — Progress Notes (Signed)
 THERAPIST PROGRESS NOTE   Session Date: 02/23/2024  Session Time:  0805 - 0855  Participation Level: Active  Behavioral Response: Well GroomedAlertAnxious, Euthymic, and tearful  Type of Therapy: Individual Therapy  Treatment Goals addressed:   Progressing (3) LTG: Increase coping skills to manage depression and improve ability to perform daily activities (OP Depression) STG: Oliva will identify cognitive patterns and beliefs that support depression (OP Depression) LTG: Be able to talk through experiences without having an increased emotional response (OP Depression)  Completed/Met (3)  ProgressTowards Goals: Progressing  Interventions: CBT, Motivational Interviewing, and Supportive  Summary: Constancia is a 43 y.o. female with past psych history of Bipolar 2, PTSD, and Anxiety, presenting for follow-up therapy session in efforts to improve management of presenting sxs.   Patient actively engaged in session, presenting in overall pleasant moods with congruent affect. 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 Reassessed presenting depressive and anxious sxs via PHQ-9 and GAD-7, noting of maintained low depressive sxs, and increased anxious sxs. Reports experiencing anxious, nervous, worries every day, solely surrounding work, making active strides to explore alternate employment opportunities and eliminate feeling of always being scrutinized. Explored pt's continued efforts at securing alternate employment, having applied for numerous jobs over recent weeks with few places hiring. Processed continued stress surrounding mother's medical needs and progressions in status, as well as pt's efforts at maintaining separation of personal and professional relationships.      02/23/2024    8:17 AM 02/09/2024    8:50 AM 01/15/2024    9:57 AM 12/09/2023    8:13 AM  GAD 7 : Generalized Anxiety Score  Nervous, Anxious, on Edge 3 1 1  0  Control/stop worrying 3 1 1  0  Worry too much - different things 1 1 1  0  Trouble relaxing 0 1 1 0  Restless 0 0 0 0  Easily annoyed or irritable 2 0 1 1  Afraid - awful might happen 3 1 0 0  Total GAD 7 Score 12 5 5 1   Anxiety Difficulty Not difficult at all Not difficult at all Somewhat difficult Not difficult at all      02/23/2024    8:15 AM 02/09/2024    8:56 AM 01/15/2024    9:52 AM 12/09/2023    8:16 AM 11/25/2023    9:00 AM  Depression screen PHQ 2/9  Decreased Interest 0 0 1 0 1  Down, Depressed, Hopeless 0 0 0 0 0  PHQ - 2 Score 0 0 1 0 1  Altered sleeping 0 0 0 0 1  Tired, decreased energy 0 0 0 0 1  Change in appetite 0 0 0 0 0  Feeling bad or failure about yourself  1 0 0 0 0  Trouble concentrating 2 0 1 0 1  Moving slowly or fidgety/restless 0 0 0 0 1  Suicidal thoughts 0 0 0 0 0  PHQ-9 Score 3 0 2 0 5  Difficult doing work/chores Not difficult at all  Somewhat difficult  Somewhat difficult    Suicidal/Homicidal: No; Without plan, intent, or means.   Therapist Response:  Clinician actively greeted patient upon joining virtual visit, assessing presenting moods and affect, engaging patient in check-in, inquiring of morning events and presenting moods. Utilizing open ended questions in eliciting recounts of events of recent weeks,  exploring newly observed stressors, recurring stressors, individual efforts at navigating challenges, and implications on moods. Utilized active listening techniques to support pt in reflections of events, providing support and validating identified thoughts and feelings surrounding workplace stress and observed sxs experienced as a result. Utilized CBT, psychoeducational and MI interventions in supporting patient in processing challenges. Clinician reassessed severity of presenting sxs,  and presence of any safety concerns.  [x]  Cognitive Challenging []  Cognitive Refocusing [x]  Cognitive Reframing [x]  Communication Skills []  Compliance Issues []  DBT [x]  Exploration of Coping Patterns [x]  Exploration of Emotions [x]  Exploration of Relationship Patterns []  Guided Imagery []  Interactive Feedback []  Interpersonal Resolutions []  Mindfulness Training []  Preventative Services [x]  Psycho-Education []  Relaxation/Deep Breathing []  Review of Treatment Plan/Progress []  Role-Play/Behavioral Rehearsal [x]  Structured Problem Solving []  Supportive Reflection [x]  Symptom Management []  Other  Patient responded well to interventions. Patient continues to meet criteria for Bipolar 2 and PTSD, and Anxiety. Patient will continue to benefit from engagement in outpatient therapy due to being the least restrictive service to meet presenting needs. Pt proves to maintain moderate progress towards tx goals.   Plan: Return again in 2 weeks.  Diagnosis:  Encounter Diagnoses  Name Primary?   Bipolar 2 disorder (HCC) Yes   Anxiety      Collaboration of Care: Psychiatrist AEB provider notes available in EHR.  Patient/Guardian was advised Release of Information must be obtained prior to any record release in order to collaborate their care with an outside provider. Patient/Guardian was advised if they have not already done so to contact the registration department to sign all necessary forms in order for us  to release information regarding their care.   Consent: Patient/Guardian gives verbal consent for treatment and assignment of benefits for services provided during this visit. Patient/Guardian expressed understanding and agreed to proceed.  Virtual Visit via Video Note  I connected with Nicole Wallace on 02/23/24 at  8:00 AM EDT by a video enabled telemedicine application and verified that I am speaking with the correct person using two identifiers.  Location: Patient: Home Provider: Home  Office   I discussed the limitations of evaluation and management by telemedicine and the availability of in person appointments. The patient expressed understanding and agreed to proceed.  I discussed the assessment and treatment plan with the patient. The patient was provided an opportunity to ask questions and all were answered. The patient agreed with the plan and demonstrated an understanding of the instructions.   The patient was advised to call back or seek an in-person evaluation if the symptoms worsen or if the condition fails to improve as anticipated.  I provided 50 minutes of non-face-to-face time during this encounter.  [facility] JONETTA Maris, MSW, LCSW 02/23/2024,  8:28 AM

## 2024-02-24 ENCOUNTER — Telehealth (HOSPITAL_COMMUNITY): Admitting: Psychiatry

## 2024-03-08 ENCOUNTER — Ambulatory Visit (INDEPENDENT_AMBULATORY_CARE_PROVIDER_SITE_OTHER): Admitting: Licensed Clinical Social Worker

## 2024-03-08 ENCOUNTER — Ambulatory Visit (HOSPITAL_COMMUNITY): Admitting: Licensed Clinical Social Worker

## 2024-03-08 DIAGNOSIS — F419 Anxiety disorder, unspecified: Secondary | ICD-10-CM | POA: Diagnosis not present

## 2024-03-08 DIAGNOSIS — F3181 Bipolar II disorder: Secondary | ICD-10-CM | POA: Diagnosis not present

## 2024-03-08 DIAGNOSIS — F4310 Post-traumatic stress disorder, unspecified: Secondary | ICD-10-CM | POA: Diagnosis not present

## 2024-03-08 NOTE — Progress Notes (Unsigned)
 THERAPIST PROGRESS NOTE   Session Date: 03/08/2024  Session Time:  0801 - 0902  Participation Level: Active  Behavioral Response: Well GroomedAlertEuthymic  Type of Therapy: Individual Therapy  Treatment Goals addressed:   Progressing (3) LTG: Increase coping skills to manage depression and improve ability to perform daily activities (OP Depression) STG: Nicole Wallace will identify cognitive patterns and beliefs that support depression (OP Depression) LTG: Be able to talk through experiences without having an increased emotional response (OP Depression)  Completed/Met (3)  ProgressTowards Goals: Progressing  Interventions: CBT, Motivational Interviewing, and Supportive  Summary: Nicole Wallace is a 43 y.o. female with past psych history of Bipolar 2, PTSD, and Anxiety, presenting for follow-up therapy session in efforts to improve management of presenting sxs.   Patient actively engaged in session, presenting in overall pleasant moods with congruent affect. Pt actively engaged in introductory check-in, sharing of Doing good, further detailing of work schedule related stressors and conflicting meeting times, mother's ongoing medical tx requiring continued stay at SNF, variabilities within relationship with sister, and impact on current interactions surrounding mother's care and sister's fears of the future course of relationship if anything were to happen to their mother. Further explored pt's individual perspective of relationship with sister and factors that pt believes to maintain relationship to include pt's nephews. Pt shared greater detail surrounding work related stressors, finding self to be in improved moods over the past two weeks than previous, sharing of continued efforts to actively secure alternate employment and encountered challenges relating to such. Pt engaged in exploration of social factors and learning of famous individuals mental health struggles, processing various dx, hx of  dx, approaches to tx, and cultural factors that have proven to result in over diagnosing of various populations throughout hx.     02/23/2024    8:17 AM 02/09/2024    8:50 AM 01/15/2024    9:57 AM 12/09/2023    8:13 AM  GAD 7 : Generalized Anxiety Score  Nervous, Anxious, on Edge 3 1 1  0  Control/stop worrying 3 1 1  0  Worry too much - different things 1 1 1  0  Trouble relaxing 0 1 1 0  Restless 0 0 0 0  Easily annoyed or irritable 2 0 1 1  Afraid - awful might happen 3 1 0 0  Total GAD 7 Score 12 5 5 1   Anxiety Difficulty Not difficult at all Not difficult at all Somewhat difficult Not difficult at all      02/23/2024    8:15 AM 02/09/2024    8:56 AM 01/15/2024    9:52 AM 12/09/2023    8:16 AM 11/25/2023    9:00 AM  Depression screen PHQ 2/9  Decreased Interest 0 0 1 0 1  Down, Depressed, Hopeless 0 0 0 0 0  PHQ - 2 Score 0 0 1 0 1  Altered sleeping 0 0 0 0 1  Tired, decreased energy 0 0 0 0 1  Change in appetite 0 0 0 0 0  Feeling bad or failure about yourself  1 0 0 0 0  Trouble concentrating 2 0 1 0 1  Moving slowly or fidgety/restless 0 0 0 0 1  Suicidal thoughts 0 0 0 0 0  PHQ-9 Score 3 0 2 0 5  Difficult doing work/chores Not difficult at all  Somewhat difficult  Somewhat difficult    Suicidal/Homicidal: No; Without plan, intent, or means.   Therapist Response:  Clinician actively greeted patient upon joining virtual visit, assessing presenting  moods and affect, engaging patient in check-in, inquiring of morning events and presenting moods. Utilized open ended questions in eliciting recounts of events of recent two weeks, exploring newly observed stressors, recurring stressors, individual efforts at navigating challenges, and implications on moods. Utilized active listening techniques to support pt reflections of events, providing support and validating shared thoughts and feelings surrounding variances in perspective of relationship with sister, and continued workplace  stress. Utilized CBT, psychoed, and strengths based interventions in supporting patient in processing challenges and areas of discussion. Clinician reassessed severity of presenting sxs, and presence of any safety concerns.  [x]  Cognitive Challenging []  Cognitive Refocusing [x]  Cognitive Reframing [x]  Communication Skills []  Compliance Issues []  DBT [x]  Exploration of Coping Patterns [x]  Exploration of Emotions [x]  Exploration of Relationship Patterns []  Guided Imagery []  Interactive Feedback []  Interpersonal Resolutions []  Mindfulness Training []  Preventative Services [x]  Psycho-Education []  Relaxation/Deep Breathing []  Review of Treatment Plan/Progress []  Role-Play/Behavioral Rehearsal [x]  Structured Problem Solving []  Supportive Reflection [x]  Symptom Management []  Other  Patient responded well to interventions. Patient continues to meet criteria for Bipolar 2 and PTSD, and Anxiety. Patient will continue to benefit from engagement in outpatient therapy due to being the least restrictive service to meet presenting needs. Pt proves to maintain moderate progress towards tx goals.   Plan: Return again in 2 weeks.  Diagnosis:  Encounter Diagnoses  Name Primary?   Bipolar 2 disorder (HCC) Yes   Anxiety    PTSD (post-traumatic stress disorder)       Collaboration of Care: Psychiatrist AEB provider notes available in EHR.  Patient/Guardian was advised Release of Information must be obtained prior to any record release in order to collaborate their care with an outside provider. Patient/Guardian was advised if they have not already done so to contact the registration department to sign all necessary forms in order for us  to release information regarding their care.   Consent: Patient/Guardian gives verbal consent for treatment and assignment of benefits for services provided during this visit. Patient/Guardian expressed understanding and agreed to proceed.  Virtual Visit via Video  Note  I connected with Nicole Wallace on 03/08/24 at  8:00 AM EDT by a video enabled telemedicine application and verified that I am speaking with the correct person using two identifiers.  Location: Patient: Home Provider: Home Office   I discussed the limitations of evaluation and management by telemedicine and the availability of in person appointments. The patient expressed understanding and agreed to proceed.  I discussed the assessment and treatment plan with the patient. The patient was provided an opportunity to ask questions and all were answered. The patient agreed with the plan and demonstrated an understanding of the instructions.   The patient was advised to call back or seek an in-person evaluation if the symptoms worsen or if the condition fails to improve as anticipated.  I provided 61 minutes of non-face-to-face time during this encounter.  [facility] JONETTA Maris, MSW, LCSW 03/08/2024,  8:02 AM

## 2024-03-16 ENCOUNTER — Ambulatory Visit (INDEPENDENT_AMBULATORY_CARE_PROVIDER_SITE_OTHER): Admitting: Licensed Clinical Social Worker

## 2024-03-16 DIAGNOSIS — F419 Anxiety disorder, unspecified: Secondary | ICD-10-CM

## 2024-03-16 DIAGNOSIS — F3181 Bipolar II disorder: Secondary | ICD-10-CM

## 2024-03-16 NOTE — Progress Notes (Signed)
 THERAPIST PROGRESS NOTE   Session Date: 03/16/2024  Session Time:  0804 - 0904  Participation Level: Active  Behavioral Response: Well GroomedAlertEuthymic  Type of Therapy: Individual Therapy  Treatment Goals addressed:   Progressing (3) LTG: Increase coping skills to manage depression and improve ability to perform daily activities (OP Depression) STG: Nicole Wallace will identify cognitive patterns and beliefs that support depression (OP Depression) LTG: Be able to talk through experiences without having an increased emotional response (OP Depression)  Completed/Met (3)  ProgressTowards Goals: Progressing  Interventions: CBT, Motivational Interviewing, and Supportive  Summary: Nicole Wallace is a 43 y.o. female with past psych history of Bipolar 2, PTSD, and Anxiety, presenting for follow-up therapy session in efforts to improve management of presenting sxs.   Patient actively engaged in session, presenting in overall pleasant moods with congruent affect. Pt actively engaged in introductory check-in, sharing of It's going, just dealing with work, further detailing of work related stressors and employer having required pt to complete an assessment yesterday in order to best determine pt's areas of strength, and potential options for employment in alternate departments/areas. Processed ways in which work related stressors have proven to impact pt's thoughts, feelings, and perspectives as it relates to work, sharing of trying to remain in a positive head space, talking things through with close friend and self, and hiding negative feelings surrounding stressors. Shared further of feeling fearful of potentially losing job due to the increased stress. Pt further shared of experiencing ongoing brian fog, inabilities to focus and concentrate, and challenging with being organized, sharing of having not been able to sit and read a book for approx. 2 years, having taken all efforts to implement structure  finding unsuccessful. Pt proved agreeable to connecting with Arfeen, MD in order to explore noted medication concerns in relation to experienced sxs. Engaged in reassessing presenting depressive and anxious sxs via PHQ-9 and GAD-7, exploring variances in scores and attributing to factors related to work challenges.     03/16/2024    8:57 AM 02/23/2024    8:17 AM 02/09/2024    8:50 AM 01/15/2024    9:57 AM  GAD 7 : Generalized Anxiety Score  Nervous, Anxious, on Edge 2 3 1 1   Control/stop worrying 2 3 1 1   Worry too much - different things 2 1 1 1   Trouble relaxing 2 0 1 1  Restless 2 0 0 0  Easily annoyed or irritable 1 2 0 1  Afraid - awful might happen 1 3 1  0  Total GAD 7 Score 12 12 5 5   Anxiety Difficulty Somewhat difficult Not difficult at all Not difficult at all Somewhat difficult      03/16/2024    9:00 AM 02/23/2024    8:15 AM 02/09/2024    8:56 AM 01/15/2024    9:52 AM 12/09/2023    8:16 AM  Depression screen PHQ 2/9  Decreased Interest 1 0 0 1 0  Down, Depressed, Hopeless 1 0 0 0 0  PHQ - 2 Score 2 0 0 1 0  Altered sleeping 1 0 0 0 0  Tired, decreased energy 1 0 0 0 0  Change in appetite 1 0 0 0 0  Feeling bad or failure about yourself  1 1 0 0 0  Trouble concentrating 2 2 0 1 0  Moving slowly or fidgety/restless 1 0 0 0 0  Suicidal thoughts 1 0 0 0 0  PHQ-9 Score 10 3 0 2 0  Difficult doing work/chores Somewhat difficult  Not difficult at all  Somewhat difficult     Suicidal/Homicidal: pSI; Without plan, intent, or means.   Therapist Response:  Clinician actively greeted patient upon joining virtual visit, assessing presenting moods and affect, inquiring of morning events and presenting moods. Utilized open ended questions in eliciting recounts of events of the past week, exploring newly observed and/or identified stressors, ongoing difficulties, observed implications on moods, and individual efforts at navigating challenges. Utilized active listening techniques to  support pt reflections of events, providing support and validating shared thoughts and feelings surrounding ongoing challenges in relation to workplace stressors. Utilized CBT, psychoed, and strengths based interventions in supporting patient in processing challenges and areas of discussion. Reassessed presenting sxs via PHQ-9 and GAD-7, processing observed sxs in relation to experienced stressors. Clinician reassessed severity of presenting sxs, and presence of any safety concerns.  [x]  Cognitive Challenging []  Cognitive Refocusing [x]  Cognitive Reframing [x]  Communication Skills []  Compliance Issues []  DBT [x]  Exploration of Coping Patterns [x]  Exploration of Emotions [x]  Exploration of Relationship Patterns []  Guided Imagery []  Interactive Feedback []  Interpersonal Resolutions []  Mindfulness Training []  Preventative Services [x]  Psycho-Education []  Relaxation/Deep Breathing []  Review of Treatment Plan/Progress []  Role-Play/Behavioral Rehearsal [x]  Structured Problem Solving [x]  Supportive Reflection [x]  Symptom Management []  Other  Patient responded well to interventions. Patient continues to meet criteria for Bipolar 2 and PTSD, and Anxiety. Patient will continue to benefit from engagement in outpatient therapy due to being the least restrictive service to meet presenting needs. Pt proves to maintain moderate progress towards tx goals.   Plan: Return again in 1 weeks.  Diagnosis:  Encounter Diagnoses  Name Primary?   Bipolar 2 disorder (HCC) Yes   Anxiety     Collaboration of Care: Psychiatrist AEB provider notes available in EHR.  Patient/Guardian was advised Release of Information must be obtained prior to any record release in order to collaborate their care with an outside provider. Patient/Guardian was advised if they have not already done so to contact the registration department to sign all necessary forms in order for us  to release information regarding their care.    Consent: Patient/Guardian gives verbal consent for treatment and assignment of benefits for services provided during this visit. Patient/Guardian expressed understanding and agreed to proceed.  Virtual Visit via Video Note  I connected with Nicole Wallace on 03/16/24 at  8:00 AM EST by a video enabled telemedicine application and verified that I am speaking with the correct person using two identifiers.  Location: Patient: Home Provider: Home Office   I discussed the limitations of evaluation and management by telemedicine and the availability of in person appointments. The patient expressed understanding and agreed to proceed.  I discussed the assessment and treatment plan with the patient. The patient was provided an opportunity to ask questions and all were answered. The patient agreed with the plan and demonstrated an understanding of the instructions.   The patient was advised to call back or seek an in-person evaluation if the symptoms worsen or if the condition fails to improve as anticipated.  I provided 60 minutes of non-face-to-face time during this encounter.  Lynwood JONETTA Maris, MSW, LCSW 03/16/2024,  9:04 AM

## 2024-03-18 ENCOUNTER — Encounter (HOSPITAL_COMMUNITY): Payer: Self-pay

## 2024-03-19 ENCOUNTER — Telehealth (HOSPITAL_COMMUNITY): Payer: Self-pay | Admitting: *Deleted

## 2024-03-19 ENCOUNTER — Encounter (HOSPITAL_COMMUNITY): Payer: Self-pay | Admitting: *Deleted

## 2024-03-19 NOTE — Telephone Encounter (Signed)
 Pt sent MyChart message stating that she has been experiencing a lot of brain fog and confusion. Pt thinks this may be a s/e of one of her medications. She says they have taken away her job duties. Dr, Okey defer to your advice.   Last visit: 02/19/24 Next visit: 05/21/24

## 2024-03-19 NOTE — Telephone Encounter (Signed)
 Ok. Thank you.

## 2024-03-19 NOTE — Telephone Encounter (Signed)
 Topomax is probably the cause of the brain fog and she couls consider decreasing this. I would defer this decision to Dr. Curry, he'll return on Monday

## 2024-03-19 NOTE — Telephone Encounter (Signed)
 Pt has previously been on Abilify  which caused weight gain, now taking Rexulti , Wellbutrin , Topamax , Hydroxyzine  prn. She was last seen 02/19/24 and will f/u on 05/22/23. Please review and advise. Thanks!

## 2024-03-22 ENCOUNTER — Ambulatory Visit (INDEPENDENT_AMBULATORY_CARE_PROVIDER_SITE_OTHER): Admitting: Licensed Clinical Social Worker

## 2024-03-22 ENCOUNTER — Encounter (HOSPITAL_COMMUNITY): Payer: Self-pay | Admitting: *Deleted

## 2024-03-22 DIAGNOSIS — F4310 Post-traumatic stress disorder, unspecified: Secondary | ICD-10-CM

## 2024-03-22 DIAGNOSIS — F3181 Bipolar II disorder: Secondary | ICD-10-CM | POA: Diagnosis not present

## 2024-03-22 DIAGNOSIS — F419 Anxiety disorder, unspecified: Secondary | ICD-10-CM | POA: Diagnosis not present

## 2024-03-22 NOTE — Telephone Encounter (Signed)
 This could be related to medication side effects.  Hoping reducing the dose of the Topamax  helped.

## 2024-03-22 NOTE — Progress Notes (Signed)
 THERAPIST PROGRESS NOTE   Session Date: 03/22/2024  Session Time:  0803 - 0909  Participation Level: Active  Behavioral Response: Well GroomedAlertEuthymic  Type of Therapy: Individual Therapy  Treatment Goals addressed:   Progressing (3) LTG: Increase coping skills to manage depression and improve ability to perform daily activities (OP Depression) STG: Ryland will identify cognitive patterns and beliefs that support depression (OP Depression) LTG: Be able to talk through experiences without having an increased emotional response (OP Depression)  Initial (2) STG: Teagan will demonstrate interest in social activities by initiating/joining social activity without prompts (Social Interpersonal Effectiveness) STG: Milika will identify behaviors that engage in social isolation (Social Interpersonal Effectiveness)  Completed/Met (3) LTG: Reduce frequency, intensity, and duration of depression symptoms so that daily functioning is improved (OP Depression) LTG: Elimination of maladaptive behaviors and thinking patterns which interfere with resolution of trauma as evidenced by a reduction in avoidant behaviors. (BH CCP Acute or Chronic Trauma Reaction) LTG: Recall traumatic events without becoming overwhelmed with negative emotions (BH CCP Acute or Chronic Trauma Reaction)  ProgressTowards Goals: Progressing  Interventions: CBT, Motivational Interviewing, and Supportive  Summary: Nicole Wallace is a 43 y.o. female with past psych history of Bipolar 2, PTSD, and Anxiety, presenting for follow-up therapy session in efforts to improve management of presenting sxs.   Patient actively engaged in session, presenting in overall pleasant moods with congruent affect. 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 Shared of feeling a lot better once accepting things and acknowledging that she had no control over outcomes of situation. Shared of feeling really good when accepting inability to control how factors impact mental and emotional health. Explored understanding of pt's sphere of control, processing factors that are within, and outside of her control and how pt has proven to find acceptance for those outside of her control, focusing on abilities to control own thoughts, feelings, and actions. Engaged in 103mo review/revision of tx plan, exploring progressions towards individualized tx goals, and identifying added areas of desired work, including newly identified goals surrounding socialization in finalizing of updated plan.     03/16/2024    8:57 AM 02/23/2024    8:17 AM 02/09/2024    8:50 AM 01/15/2024    9:57 AM  GAD 7 : Generalized Anxiety Score  Nervous, Anxious, on Edge 2 3 1 1   Control/stop worrying 2 3 1 1   Worry too much - different things 2 1 1 1   Trouble relaxing 2 0 1 1  Restless 2 0 0 0  Easily annoyed or irritable 1 2 0 1  Afraid - awful might happen 1 3 1  0  Total GAD 7 Score 12 12 5 5   Anxiety Difficulty Somewhat difficult Not difficult at all Not difficult at all Somewhat difficult      03/16/2024    9:00 AM 02/23/2024    8:15 AM 02/09/2024    8:56 AM 01/15/2024    9:52 AM 12/09/2023    8:16 AM  Depression screen PHQ 2/9  Decreased Interest 1 0 0 1 0  Down, Depressed, Hopeless 1 0 0 0 0  PHQ -  2 Score 2 0 0 1 0  Altered sleeping 1 0 0 0 0  Tired, decreased energy 1 0 0 0 0  Change in appetite 1 0 0 0 0  Feeling bad or failure about yourself  1 1 0 0 0  Trouble concentrating 2 2 0 1 0  Moving slowly or fidgety/restless 1 0 0 0 0  Suicidal thoughts 1 0 0 0 0  PHQ-9  Score 10  3  0  2  0   Difficult doing work/chores Somewhat difficult Not difficult at all  Somewhat difficult      Data saved with a previous flowsheet row definition    Suicidal/Homicidal: No; Without plan, intent, or means.   Therapist Response:  Clinician actively greeted patient upon joining visit, assessing presenting moods and affect, inquiring of morning events and presenting moods. Utilized open ended questions in eliciting recounts of events of the past week, exploring newly identified and/or experienced stressors, ongoing challenges, individual observations of moods, and efforts at navigating areas of difficulty. Utilized active listening techniques to support pt reflections of events, providing support and validating shared thoughts and feelings surrounding outcomes of workplace related stressors, and further supporting in processing individual means of navigating challenges surrounding recent events.  Utilized CBT, psychoed, supportive reflections, and strengths based interventions in supporting patient in processing challenges and areas of discussion. Engaged in 24mo review of tx plan in review/revising goals for future 24mo course of tx. Clinician reassessed severity of presenting sxs, and presence of any safety concerns.  [x]  Cognitive Challenging []  Cognitive Refocusing [x]  Cognitive Reframing [x]  Communication Skills []  Compliance Issues []  DBT [x]  Exploration of Coping Patterns [x]  Exploration of Emotions [x]  Exploration of Relationship Patterns []  Guided Imagery []  Interactive Feedback [x]  Interpersonal Resolutions []  Mindfulness Training []  Preventative Services [x]  Psycho-Education []  Relaxation/Deep Breathing [x]  Review of Treatment Plan/Progress []  Role-Play/Behavioral Rehearsal [x]  Structured Problem Solving [x]  Supportive Reflection [x]  Symptom Management []  Other  Patient responded well to interventions. Patient continues to meet criteria for Bipolar 2 and PTSD, and  Anxiety. Patient will continue to benefit from engagement in outpatient therapy due to being the least restrictive service to meet presenting needs. Pt proves to maintain moderate progress towards tx goals.   Plan: Return again in 1 weeks.  Diagnosis:  Encounter Diagnoses  Name Primary?   Bipolar 2 disorder (HCC) Yes   Anxiety    PTSD (post-traumatic stress disorder)      Collaboration of Care: Psychiatrist AEB provider notes available in EHR.  Patient/Guardian was advised Release of Information must be obtained prior to any record release in order to collaborate their care with an outside provider. Patient/Guardian was advised if they have not already done so to contact the registration department to sign all necessary forms in order for us  to release information regarding their care.   Consent: Patient/Guardian gives verbal consent for treatment and assignment of benefits for services provided during this visit. Patient/Guardian expressed understanding and agreed to proceed.  Virtual Visit via Video Note  I connected with Nicole Wallace on 03/22/24 at  8:00 AM EST by a video enabled telemedicine application and verified that I am speaking with the correct person using two identifiers.  Location: Patient: Home Provider: Home Office   I discussed the limitations of evaluation and management by telemedicine and the availability of in person appointments. The patient expressed understanding and agreed to proceed.  I discussed the assessment and treatment plan with the patient. The patient was provided  an opportunity to ask questions and all were answered. The patient agreed with the plan and demonstrated an understanding of the instructions.   The patient was advised to call back or seek an in-person evaluation if the symptoms worsen or if the condition fails to improve as anticipated.  I provided 66 minutes of non-face-to-face time during this encounter.  Lynwood JONETTA Maris, MSW,  LCSW 03/22/2024,  8:05 AM

## 2024-03-22 NOTE — Telephone Encounter (Signed)
 She should reduced Topamax  from 150 mg to only 50 mg to see if brain fog is get better.  She may have to completely stop the Topamax  if reducing the dose helps.  If symptoms do not improve may need to see sooner than her scheduled appointment.

## 2024-03-22 NOTE — Telephone Encounter (Signed)
 Message sent to pt.

## 2024-03-30 ENCOUNTER — Ambulatory Visit (INDEPENDENT_AMBULATORY_CARE_PROVIDER_SITE_OTHER): Admitting: Licensed Clinical Social Worker

## 2024-03-30 DIAGNOSIS — F419 Anxiety disorder, unspecified: Secondary | ICD-10-CM | POA: Diagnosis not present

## 2024-03-30 DIAGNOSIS — F3181 Bipolar II disorder: Secondary | ICD-10-CM | POA: Diagnosis not present

## 2024-03-30 NOTE — Progress Notes (Unsigned)
 THERAPIST PROGRESS NOTE   Session Date: 03/30/2024  Session Time:  1410 - 1503  Participation Level: Active  Behavioral Response: Well GroomedAlertEuthymic  Type of Therapy: Individual Therapy  Treatment Goals addressed:   Progressing (3) LTG: Increase coping skills to manage depression and improve ability to perform daily activities (OP Depression) STG: [name] will identify cognitive patterns and beliefs that support depression (OP Depression) LTG: Be able to talk through experiences without having an increased emotional response (OP Depression)  Initial (2) STG: Nicole Wallace will demonstrate interest in social activities by initiating/joining social activity without prompts (Social Interpersonal Effectiveness) STG: Nicole Wallace will identify behaviors that engage in social isolation (Social Interpersonal Effectiveness)  Completed/Met (3) LTG: Reduce frequency, intensity, and duration of depression symptoms so that daily functioning is improved (OP Depression) LTG: Elimination of maladaptive behaviors and thinking patterns which interfere with resolution of trauma as evidenced by a reduction in avoidant behaviors. (BH CCP Acute or Chronic Trauma Reaction) LTG: Recall traumatic events without becoming overwhelmed with negative emotions (BH CCP Acute or Chronic Trauma Reaction)  ProgressTowards Goals: Progressing  Interventions: CBT, Motivational Interviewing, and Supportive  Summary: Nicole Wallace is a 43 y.o. female with past psych history of Bipolar 2, PTSD, and Anxiety, presenting for follow-up therapy session in efforts to improve management of presenting sxs.   Patient actively engaged in session, presenting in overall pleasant moods with congruent affect. Pt actively engaged in introductory check-in, sharing of doing well overall, detailing of having received contact from a potential employment opportunity in order to schedule an interview over the coming days. Pt actively engaged in  reflection of recent events, sharing of having adjusted to newly assigned role within workplace, finding to experience a reduction in stressor, and optimism for 2 upcoming job interviews tomorrow.  Pt additionally shared of having titrated Topamax  to 50mg  over the past week, having observed significant improvements in presenting sxs, detailing of reduction in brain fog, improved abilities in completing tasks, and decrease in emotional dysregulation.  Engaged in reassessing presenting depressive and anxious sxs observed over the past two weeks, noting of significant reductions in experienced sxs over recent weeks, attributing to reduction in stress surrounding workplace, and finding self accepting factors that prove to be outside of her control.  Pt shared details of recent increase in interactions with close friend, finding self utilizing increased support from friend as well as receiving support in updating resume in efforts to seek alternate employment opportunities.     03/30/2024    2:43 PM 03/16/2024    8:57 AM 02/23/2024    8:17 AM 02/09/2024    8:50 AM  GAD 7 : Generalized Anxiety Score  Nervous, Anxious, on Edge 1 2 3 1   Control/stop worrying 1 2 3 1   Worry too much - different things 0 2 1 1   Trouble relaxing 1 2 0 1  Restless 1 2 0 0  Easily annoyed or irritable 0 1 2 0  Afraid - awful might happen 1 1 3 1   Total GAD 7 Score 5 12 12 5   Anxiety Difficulty Not difficult at all Somewhat difficult Not difficult at all Not difficult at all      03/30/2024    2:47 PM 03/16/2024    9:00 AM 02/23/2024    8:15 AM 02/09/2024    8:56 AM 01/15/2024    9:52 AM  Depression screen PHQ 2/9  Decreased Interest 0 1 0 0 1  Down, Depressed, Hopeless 0 1 0 0 0  PHQ - 2  Score 0 2 0 0 1  Altered sleeping 1 1 0 0 0  Tired, decreased energy 0 1 0 0 0  Change in appetite 0 1 0 0 0  Feeling bad or failure about yourself  1 1 1  0 0  Trouble concentrating 0 2 2 0 1  Moving slowly or fidgety/restless  0 1 0 0 0  Suicidal thoughts 1 1 0 0 0  PHQ-9 Score 3 10  3   0  2   Difficult doing work/chores Not difficult at all Somewhat difficult Not difficult at all  Somewhat difficult     Data saved with a previous flowsheet row definition    Suicidal/Homicidal: No; Without plan, intent, or means.   Therapist Response:  Clinician actively greeted patient upon joining visit, assessing presenting moods and affect, exploring daily events and presenting moods. Utilized open ended questions in eliciting recounts of events of the past week, exploring ongoing challenges and individual means of navigating challenges. Utilized active listening techniques to support pt reflections of events, providing support and validation of thoughts and perspectives in relation to rational means of approaching workplace challenges and action steps pt has identified as means on managing stressors.  Utilized CBT, psychoed, supportive reflections, and MI interventions in supporting patient in processing challenges and areas of discussion. Engaged in reassessing presenting depressive and anxious sxs via PHQ-9 and GAD-7, further processing reductions in observed sxs. Clinician reassessed severity of presenting sxs, and presence of any safety concerns.  [x]  Cognitive Challenging []  Cognitive Refocusing [x]  Cognitive Reframing [x]  Communication Skills []  Compliance Issues []  DBT [x]  Exploration of Coping Patterns [x]  Exploration of Emotions [x]  Exploration of Relationship Patterns []  Guided Imagery []  Interactive Feedback [x]  Interpersonal Resolutions []  Mindfulness Training []  Preventative Services [x]  Psycho-Education []  Relaxation/Deep Breathing []  Review of Treatment Plan/Progress []  Role-Play/Behavioral Rehearsal [x]  Structured Problem Solving [x]  Supportive Reflection [x]  Symptom Management []  Other  Patient responded well to interventions. Patient continues to meet criteria for Bipolar 2 and PTSD, and Anxiety. Patient  will continue to benefit from engagement in outpatient therapy due to being the least restrictive service to meet presenting needs. Pt proves to maintain moderate progress towards tx goals.   Plan: Return again in 1 weeks.  Diagnosis:  Encounter Diagnoses  Name Primary?   Bipolar 2 disorder (HCC) Yes   Anxiety     Collaboration of Care: Psychiatrist AEB provider notes available in EHR.  Patient/Guardian was advised Release of Information must be obtained prior to any record release in order to collaborate their care with an outside provider. Patient/Guardian was advised if they have not already done so to contact the registration department to sign all necessary forms in order for us  to release information regarding their care.   Consent: Patient/Guardian gives verbal consent for treatment and assignment of benefits for services provided during this visit. Patient/Guardian expressed understanding and agreed to proceed.  Virtual Visit via Video Note  I connected with Nicole Wallace on 03/30/24 at  2:00 PM EST by a video enabled telemedicine application and verified that I am speaking with the correct person using two identifiers.  Location: Patient: Home Provider: Home Office   I discussed the limitations of evaluation and management by telemedicine and the availability of in person appointments. The patient expressed understanding and agreed to proceed.  I discussed the assessment and treatment plan with the patient. The patient was provided an opportunity to ask questions and all were answered. The patient agreed with the plan and  demonstrated an understanding of the instructions.   The patient was advised to call back or seek an in-person evaluation if the symptoms worsen or if the condition fails to improve as anticipated.  I provided 53 minutes of non-face-to-face time during this encounter.  Lynwood JONETTA Maris, MSW, LCSW 03/30/2024,  2:51 PM

## 2024-04-01 ENCOUNTER — Encounter (HOSPITAL_COMMUNITY): Payer: Self-pay

## 2024-04-05 ENCOUNTER — Ambulatory Visit (INDEPENDENT_AMBULATORY_CARE_PROVIDER_SITE_OTHER): Admitting: Licensed Clinical Social Worker

## 2024-04-05 DIAGNOSIS — F3181 Bipolar II disorder: Secondary | ICD-10-CM | POA: Diagnosis not present

## 2024-04-05 DIAGNOSIS — F419 Anxiety disorder, unspecified: Secondary | ICD-10-CM

## 2024-04-05 NOTE — Progress Notes (Signed)
 THERAPIST PROGRESS NOTE   Session Date: 04/05/2024  Session Time:  0805 - 0900  Participation Level: Active  Behavioral Response: Well GroomedAlertEuthymic  Type of Therapy: Individual Therapy  Treatment Goals addressed:   Progressing (3) LTG: Increase coping skills to manage depression and improve ability to perform daily activities (OP Depression) STG: Nicole Wallace will identify cognitive patterns and beliefs that support depression (OP Depression) LTG: Be able to talk through experiences without having an increased emotional response (OP Depression)  Initial (2) STG: Nicole Wallace will demonstrate interest in social activities by initiating/joining social activity without prompts (Social Interpersonal Effectiveness) STG: Nicole Wallace will identify behaviors that engage in social isolation (Social Interpersonal Effectiveness)  Completed/Met (3) LTG: Reduce frequency, intensity, and duration of depression symptoms so that daily functioning is improved (OP Depression) LTG: Elimination of maladaptive behaviors and thinking patterns which interfere with resolution of trauma as evidenced by a reduction in avoidant behaviors. (BH CCP Acute or Chronic Trauma Reaction) LTG: Recall traumatic events without becoming overwhelmed with negative emotions (BH CCP Acute or Chronic Trauma Reaction)  ProgressTowards Goals: Progressing  Interventions: CBT, Motivational Interviewing, and Supportive  Summary: Nicole Wallace is a 43 y.o. female with past psych history of Bipolar 2, PTSD, and Anxiety, presenting for follow-up therapy session in efforts to improve management of presenting sxs.   Patient actively engaged in session, presenting in overall pleasant moods with congruent affect. Pt actively engaged in introductory check-in, sharing of doing well overall, sharing of last week having been alright, detailing of having had three interviews last week, one being part-time with HR, another being medical records, and  another being EHR support. Pt further shared of additional interviews scheduled this week, processing feeling to have more hope of potential to get out of current work environment, sharing of having noticed improvements in moods. Reflected on overall challenges over the past 2-3 years, specifically surrounding two job losses in recent years, processing variances and similarities between both instances. 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     03/30/2024    2:43 PM 03/16/2024    8:57 AM 02/23/2024    8:17 AM 02/09/2024    8:50 AM  GAD 7 : Generalized Anxiety Score  Nervous, Anxious, on Edge 1 2 3 1   Control/stop worrying 1 2 3 1   Worry too much - different things 0 2 1 1   Trouble relaxing 1 2 0 1  Restless 1 2 0 0  Easily annoyed or irritable 0 1 2 0  Afraid - awful might happen 1 1 3 1   Total GAD 7 Score 5 12 12 5   Anxiety Difficulty Not difficult at all Somewhat difficult Not difficult at all Not difficult at all      03/30/2024    2:47 PM 03/16/2024    9:00 AM 02/23/2024    8:15 AM 02/09/2024    8:56 AM 01/15/2024    9:52 AM  Depression screen PHQ 2/9  Decreased Interest 0 1 0 0 1  Down, Depressed, Hopeless 0 1 0 0 0  PHQ - 2 Score 0 2 0 0 1  Altered sleeping 1 1 0 0 0  Tired, decreased energy 0 1 0 0 0  Change in appetite 0 1 0 0 0  Feeling bad or failure about yourself  1 1 1  0 0  Trouble concentrating 0 2 2 0  1  Moving slowly or fidgety/restless 0 1 0 0 0  Suicidal thoughts 1 1 0 0 0  PHQ-9 Score 3 10  3   0  2   Difficult doing work/chores Not difficult at all Somewhat difficult Not difficult at all  Somewhat difficult     Data saved with a previous flowsheet row definition    Suicidal/Homicidal: No; Without plan, intent, or means.   Therapist Response:   Clinician actively greeted patient upon joining visit, assessing presenting moods and affect, exploring recent events and presenting moods. Utilized open ended questions in eliciting recounts of events of the past week, exploring newly presenting progressions in relation to securing alternate employment opportunities, and individual means of navigating challenges. Utilized active listening techniques to support pt reflections of weekly events, providing support and validation of thoughts and perspectives in relation to improved perspectives and outlook, exploring observed improvements in moods. Utilized CBT, psychoed, and supportive reflections interventions in supporting patient in processing challenges and areas of discussion. Clinician reassessed severity of presenting sxs, and presence of any safety concerns.  [x]  Cognitive Challenging []  Cognitive Refocusing [x]  Cognitive Reframing [x]  Communication Skills []  Compliance Issues []  DBT [x]  Exploration of Coping Patterns [x]  Exploration of Emotions []  Exploration of Relationship Patterns []  Guided Imagery []  Interactive Feedback [x]  Interpersonal Resolutions []  Mindfulness Training []  Preventative Services [x]  Psycho-Education []  Relaxation/Deep Breathing []  Review of Treatment Plan/Progress [x]  Role-Play/Behavioral Rehearsal [x]  Structured Problem Solving [x]  Supportive Reflection [x]  Symptom Management []  Other  Patient responded well to interventions. Patient continues to meet criteria for Bipolar 2 and PTSD, and Anxiety. Patient will continue to benefit from engagement in outpatient therapy due to being the least restrictive service to meet presenting needs. Pt proves to maintain moderate progress towards tx goals.   Plan: Return again in 1 weeks.  Diagnosis:  Encounter Diagnoses  Name Primary?   Bipolar 2 disorder (HCC) Yes   Anxiety      Collaboration of Care: Psychiatrist AEB provider notes available in EHR.  Patient/Guardian  was advised Release of Information must be obtained prior to any record release in order to collaborate their care with an outside provider. Patient/Guardian was advised if they have not already done so to contact the registration department to sign all necessary forms in order for us  to release information regarding their care.   Consent: Patient/Guardian gives verbal consent for treatment and assignment of benefits for services provided during this visit. Patient/Guardian expressed understanding and agreed to proceed.  Virtual Visit via Video Note  I connected with Nicole Wallace on 04/05/24 at  8:00 AM EST by a video enabled telemedicine application and verified that I am speaking with the correct person using two identifiers.  Location: Patient: Home Provider: Home Office   I discussed the limitations of evaluation and management by telemedicine and the availability of in person appointments. The patient expressed understanding and agreed to proceed.  I discussed the assessment and treatment plan with the patient. The patient was provided an opportunity to ask questions and all were answered. The patient agreed with the plan and demonstrated an understanding of the instructions.   The patient was advised to call back or seek an in-person evaluation if the symptoms worsen or if the condition fails to improve as anticipated.  I provided 55 minutes of non-face-to-face time during this encounter.  Lynwood JONETTA Maris, MSW, LCSW 04/05/2024,  8:06 AM

## 2024-04-16 ENCOUNTER — Ambulatory Visit (INDEPENDENT_AMBULATORY_CARE_PROVIDER_SITE_OTHER): Admitting: Licensed Clinical Social Worker

## 2024-04-16 DIAGNOSIS — F419 Anxiety disorder, unspecified: Secondary | ICD-10-CM

## 2024-04-16 DIAGNOSIS — F3181 Bipolar II disorder: Secondary | ICD-10-CM

## 2024-04-16 NOTE — Progress Notes (Signed)
 THERAPIST PROGRESS NOTE   Session Date: 04/16/2024  Session Time:  0809 - 0906  Participation Level: Active  Behavioral Response: Well GroomedAlertEuthymic  Type of Therapy: Individual Therapy  Treatment Goals addressed:   Progressing (3) LTG: Increase coping skills to manage depression and improve ability to perform daily activities (OP Depression) STG: Nicole Wallace will identify cognitive patterns and beliefs that support depression (OP Depression) LTG: Be able to talk through experiences without having an increased emotional response (OP Depression)  Initial (2) STG: Nicole Wallace will demonstrate interest in social activities by initiating/joining social activity without prompts (Social Interpersonal Effectiveness) STG: Nicole Wallace will identify behaviors that engage in social isolation (Social Interpersonal Effectiveness)  Completed/Met (3) LTG: Reduce frequency, intensity, and duration of depression symptoms so that daily functioning is improved (OP Depression) LTG: Elimination of maladaptive behaviors and thinking patterns which interfere with resolution of trauma as evidenced by a reduction in avoidant behaviors. (BH CCP Acute or Chronic Trauma Reaction) LTG: Recall traumatic events without becoming overwhelmed with negative emotions (BH CCP Acute or Chronic Trauma Reaction)  ProgressTowards Goals: Progressing  Interventions: CBT, Motivational Interviewing, and Supportive  Summary: Nicole Wallace is a 43 y.o. female with past psych history of Bipolar 2, PTSD, and Anxiety, presenting for follow-up therapy session in efforts to improve management of presenting sxs.   Patient actively engaged in session, presenting in overall pleasant mood with congruent affect. During check-in, patient reported doing well and noted being off work today, acknowledging the importance of prioritizing her own needs and having increased leave built up. Patient expressed ongoing difficulty celebrating her birthday due to  longstanding negative associations stemming from the loss of her best friend days after her birthday several years ago.  Patient engaged in processing improvements in mood over recent weeks, correlating with progress made toward pursuing alternative employment opportunities. Patient acknowledged feeling emotionally better and recognized the continued need to focus on self-care and to increase social interaction and connection with others. Explored patient's irrational thoughts and cognitive distortions surrounding isolation and perceived comfort in avoiding socialization. Reviewed the cognitive triangle (situation-thoughts-feelings-behaviors) in relation to negative work related events and patterns of avoidance.  Patient further discussed stress and concerns related to not having been in a romantic relationship for over a decade. Explored barriers contributing to difficulty meeting potential partners beyond those seeking purely sexual relationships. Processed the importance of ongoing self-care, value alignment, and emotional readiness as foundational aspects that support healthier relational opportunities.     03/30/2024    2:43 PM 03/16/2024    8:57 AM 02/23/2024    8:17 AM 02/09/2024    8:50 AM  GAD 7 : Generalized Anxiety Score  Nervous, Anxious, on Edge 1 2 3 1   Control/stop worrying 1 2 3 1   Worry too much - different things 0 2 1 1   Trouble relaxing 1 2 0 1  Restless 1 2 0 0  Easily annoyed or irritable 0 1 2 0  Afraid - awful might happen 1 1 3 1   Total GAD 7 Score 5 12 12 5   Anxiety Difficulty Not difficult at all Somewhat difficult Not difficult at all Not difficult at all      03/30/2024    2:47 PM 03/16/2024    9:00 AM 02/23/2024    8:15 AM 02/09/2024    8:56 AM 01/15/2024    9:52 AM  Depression screen PHQ 2/9  Decreased Interest 0 1 0 0 1  Down, Depressed, Hopeless 0 1 0 0 0  PHQ - 2  Score 0 2 0 0 1  Altered sleeping 1 1 0 0 0  Tired, decreased energy 0 1 0 0 0  Change in  appetite 0 1 0 0 0  Feeling bad or failure about yourself  1 1 1  0 0  Trouble concentrating 0 2 2 0 1  Moving slowly or fidgety/restless 0 1 0 0 0  Suicidal thoughts 1 1 0 0 0  PHQ-9 Score 3 10  3   0  2   Difficult doing work/chores Not difficult at all Somewhat difficult Not difficult at all  Somewhat difficult     Data saved with a previous flowsheet row definition    Suicidal/Homicidal: No; Without plan, intent, or means.   Therapist Response:  Clinician actively greeted patient upon joining visit, assessing presenting moods and affect, engaging in introductory check-in. Utilized open ended questions in eliciting recounts of events of the past week and presenting challenges. Utilized active listening techniques to support pt reflections of weekly events. Utilized CBT interventions in identifying and challenging distorted thoughts, reviewing cognitive triangles, and increasing insight into avoidance patterns; MI to enhance motivation for increasing social engagement and continued pursuit of meaningful life changes; supportive reflection to validate emotional experiences and reinforce strengths; psychoeducation on grief associations, social connection, and healthy relationship formation. Clinician reassessed severity of presenting sxs, and presence of any safety concerns.  [x]  Cognitive Challenging []  Cognitive Refocusing [x]  Cognitive Reframing [x]  Communication Skills []  Compliance Issues []  DBT [x]  Exploration of Coping Patterns [x]  Exploration of Emotions []  Exploration of Relationship Patterns []  Guided Imagery []  Interactive Feedback [x]  Interpersonal Resolutions []  Mindfulness Training []  Preventative Services [x]  Psycho-Education []  Relaxation/Deep Breathing []  Review of Treatment Plan/Progress [x]  Role-Play/Behavioral Rehearsal [x]  Structured Problem Solving [x]  Supportive Reflection [x]  Symptom Management []  Other  Patient responded well to interventions. Patient continues to  meet criteria for Bipolar 2 and PTSD, and Anxiety. Patient will continue to benefit from engagement in outpatient therapy due to being the least restrictive service to meet presenting needs. Pt proves to maintain moderate progress towards tx goals.   Plan: Return again in 1 weeks.  Diagnosis:  Encounter Diagnoses  Name Primary?   Bipolar 2 disorder (HCC) Yes   Anxiety     Collaboration of Care: Psychiatrist AEB provider notes available in EHR.  Patient/Guardian was advised Release of Information must be obtained prior to any record release in order to collaborate their care with an outside provider. Patient/Guardian was advised if they have not already done so to contact the registration department to sign all necessary forms in order for us  to release information regarding their care.   Consent: Patient/Guardian gives verbal consent for treatment and assignment of benefits for services provided during this visit. Patient/Guardian expressed understanding and agreed to proceed.  Virtual Visit via Video Note  I connected with Nicole Wallace on 04/16/24 at  8:00 AM EST by a video enabled telemedicine application and verified that I am speaking with the correct person using two identifiers.  Location: Patient: Home Provider: Home Office   I discussed the limitations of evaluation and management by telemedicine and the availability of in person appointments. The patient expressed understanding and agreed to proceed.  I discussed the assessment and treatment plan with the patient. The patient was provided an opportunity to ask questions and all were answered. The patient agreed with the plan and demonstrated an understanding of the instructions.   The patient was advised to call back or seek an in-person evaluation  if the symptoms worsen or if the condition fails to improve as anticipated.  I provided 57 minutes of non-face-to-face time during this encounter.  Lynwood JONETTA Maris, MSW,  LCSW 04/16/2024,  9:07 AM

## 2024-04-19 NOTE — Progress Notes (Incomplete)
 THERAPIST PROGRESS NOTE   Session Date: 04/16/2024  Session Time:  0809 - 0906  Participation Level: Active  Behavioral Response: Well GroomedAlertEuthymic  Type of Therapy: Individual Therapy  Treatment Goals addressed:   Progressing (3) LTG: Increase coping skills to manage depression and improve ability to perform daily activities (OP Depression) STG: Nicole Wallace will identify cognitive patterns and beliefs that support depression (OP Depression) LTG: Be able to talk through experiences without having an increased emotional response (OP Depression)  Initial (2) STG: Nicole Wallace will demonstrate interest in social activities by initiating/joining social activity without prompts (Social Interpersonal Effectiveness) STG: Nicole Wallace will identify behaviors that engage in social isolation (Social Interpersonal Effectiveness)  Completed/Met (3) LTG: Reduce frequency, intensity, and duration of depression symptoms so that daily functioning is improved (OP Depression) LTG: Elimination of maladaptive behaviors and thinking patterns which interfere with resolution of trauma as evidenced by a reduction in avoidant behaviors. (BH CCP Acute or Chronic Trauma Reaction) LTG: Recall traumatic events without becoming overwhelmed with negative emotions (BH CCP Acute or Chronic Trauma Reaction)  ProgressTowards Goals: Progressing  Interventions: CBT, Motivational Interviewing, and Supportive  Summary: Nicole Wallace is a 43 y.o. female with past psych history of Bipolar 2, PTSD, and Anxiety, presenting for follow-up therapy session in efforts to improve management of presenting sxs.   Patient actively engaged in session, presenting in overall pleasant mood with congruent affect. During check-in, patient reported doing well and noted being off work today, acknowledging the importance of prioritizing her own needs. Patient expressed ongoing difficulty celebrating her birthday due to longstanding negative associations  stemming from the loss of her best friend days after her birthday several years ago.  Patient engaged in processing improvements in mood over recent weeks, correlating with progress made toward pursuing alternative employment opportunities. Patient acknowledged feeling emotionally better and recognized the continued need to focus on self-care and to increase social interaction and connection with others. Explored patient's irrational thoughts and cognitive distortions surrounding isolation and perceived comfort in avoiding socialization. Reviewed the cognitive triangle (situation-thoughts-feelings-behaviors) in relation to recent negative events and patterns of avoidance.  Patient further discussed stress and concerns related to not having been in a romantic relationship for over a decade. Explored barriers contributing to difficulty meeting potential partners beyond those seeking purely sexual relationships. Processed the importance of ongoing self-care, value alignment, and emotional readiness as foundational aspects that support healthier relational opportunities.     03/30/2024    2:43 PM 03/16/2024    8:57 AM 02/23/2024    8:17 AM 02/09/2024    8:50 AM  GAD 7 : Generalized Anxiety Score  Nervous, Anxious, on Edge 1 2 3 1   Control/stop worrying 1 2 3 1   Worry too much - different things 0 2 1 1   Trouble relaxing 1 2 0 1  Restless 1 2 0 0  Easily annoyed or irritable 0 1 2 0  Afraid - awful might happen 1 1 3 1   Total GAD 7 Score 5 12 12 5   Anxiety Difficulty Not difficult at all Somewhat difficult Not difficult at all Not difficult at all      03/30/2024    2:47 PM 03/16/2024    9:00 AM 02/23/2024    8:15 AM 02/09/2024    8:56 AM 01/15/2024    9:52 AM  Depression screen PHQ 2/9  Decreased Interest 0 1 0 0 1  Down, Depressed, Hopeless 0 1 0 0 0  PHQ - 2 Score 0 2 0 0 1  Altered sleeping 1 1 0 0 0  Tired, decreased energy 0 1 0 0 0  Change in appetite 0 1 0 0 0  Feeling bad or  failure about yourself  1 1 1  0 0  Trouble concentrating 0 2 2 0 1  Moving slowly or fidgety/restless 0 1 0 0 0  Suicidal thoughts 1 1 0 0 0  PHQ-9 Score 3 10  3   0  2   Difficult doing work/chores Not difficult at all Somewhat difficult Not difficult at all  Somewhat difficult     Data saved with a previous flowsheet row definition    Suicidal/Homicidal: No; Without plan, intent, or means.   Therapist Response:  Clinician *** actively greeted patient upon joining visit, assessing presenting moods and affect, exploring recent events and presenting moods. Utilized open ended questions in eliciting recounts of events of the past week, exploring newly presenting progressions in relation to securing alternate employment opportunities, and individual means of navigating challenges. Utilized active listening techniques to support pt reflections of weekly events, providing support and validation of thoughts and perspectives in relation to improved perspectives and outlook, exploring observed improvements in moods. Utilized CBT, psychoed, and supportive reflections interventions in supporting patient in processing challenges and areas of discussion. Clinician reassessed severity of presenting sxs, and presence of any safety concerns.  [x]  Cognitive Challenging []  Cognitive Refocusing [x]  Cognitive Reframing [x]  Communication Skills []  Compliance Issues []  DBT [x]  Exploration of Coping Patterns [x]  Exploration of Emotions []  Exploration of Relationship Patterns []  Guided Imagery []  Interactive Feedback [x]  Interpersonal Resolutions []  Mindfulness Training []  Preventative Services [x]  Psycho-Education []  Relaxation/Deep Breathing []  Review of Treatment Plan/Progress [x]  Role-Play/Behavioral Rehearsal [x]  Structured Problem Solving [x]  Supportive Reflection [x]  Symptom Management []  Other  Patient responded well to interventions. Patient continues to meet criteria for Bipolar 2 and PTSD, and  Anxiety. Patient will continue to benefit from engagement in outpatient therapy due to being the least restrictive service to meet presenting needs. Pt proves to maintain moderate progress towards tx goals.   Plan: Return again in 1 weeks.  Diagnosis:  Encounter Diagnoses  Name Primary?  . Bipolar 2 disorder (HCC) Yes  . Anxiety       Collaboration of Care: Psychiatrist AEB provider notes available in EHR.  Patient/Guardian was advised Release of Information must be obtained prior to any record release in order to collaborate their care with an outside provider. Patient/Guardian was advised if they have not already done so to contact the registration department to sign all necessary forms in order for us  to release information regarding their care.   Consent: Patient/Guardian gives verbal consent for treatment and assignment of benefits for services provided during this visit. Patient/Guardian expressed understanding and agreed to proceed.  Virtual Visit via Video Note  I connected with Amalee Wallace on 04/16/24 at  8:00 AM EST by a video enabled telemedicine application and verified that I am speaking with the correct person using two identifiers.  Location: Patient: Home Provider: Home Office   I discussed the limitations of evaluation and management by telemedicine and the availability of in person appointments. The patient expressed understanding and agreed to proceed.  I discussed the assessment and treatment plan with the patient. The patient was provided an opportunity to ask questions and all were answered. The patient agreed with the plan and demonstrated an understanding of the instructions.   The patient was advised to call back or seek an in-person evaluation if the symptoms  worsen or if the condition fails to improve as anticipated.  I provided 57 minutes of non-face-to-face time during this encounter.  Lynwood JONETTA Maris, MSW, LCSW 04/16/2024,  9:07 AM

## 2024-04-20 ENCOUNTER — Ambulatory Visit (INDEPENDENT_AMBULATORY_CARE_PROVIDER_SITE_OTHER): Admitting: Licensed Clinical Social Worker

## 2024-04-20 DIAGNOSIS — F419 Anxiety disorder, unspecified: Secondary | ICD-10-CM

## 2024-04-20 DIAGNOSIS — F3181 Bipolar II disorder: Secondary | ICD-10-CM

## 2024-04-20 NOTE — Progress Notes (Unsigned)
 THERAPIST PROGRESS NOTE   Session Date: 04/20/2024  Session Time:  0808 - 9147  Participation Level: Active  Behavioral Response: Well GroomedAlertEuthymic  Type of Therapy: Individual Therapy  Treatment Goals addressed:   Progressing (3) LTG: Increase coping skills to manage depression and improve ability to perform daily activities (OP Depression) STG: Avamae will identify cognitive patterns and beliefs that support depression (OP Depression) LTG: Be able to talk through experiences without having an increased emotional response (OP Depression)  Initial (2) STG: Yailin will demonstrate interest in social activities by initiating/joining social activity without prompts (Social Interpersonal Effectiveness) STG: Ryeleigh will identify behaviors that engage in social isolation (Social Interpersonal Effectiveness)  Completed/Met (3) LTG: Reduce frequency, intensity, and duration of depression symptoms so that daily functioning is improved (OP Depression) LTG: Elimination of maladaptive behaviors and thinking patterns which interfere with resolution of trauma as evidenced by a reduction in avoidant behaviors. (BH CCP Acute or Chronic Trauma Reaction) LTG: Recall traumatic events without becoming overwhelmed with negative emotions (BH CCP Acute or Chronic Trauma Reaction)  ProgressTowards Goals: Progressing  Interventions: CBT, Motivational Interviewing, and Supportive  Summary: Letecia is a 43 y.o. female with past psych history of Bipolar 2, PTSD, and Anxiety, presenting for follow-up therapy session in efforts to improve management of presenting sxs.   Patient actively engaged in session, presenting in overall pleasant mood with congruent affect. During check-in, patient shared of needing to connect regarding continued challenges surrounding relationships, sharing of having spent time with romantic interest over the weekend.  Feel like she just gets bored, feel like it's not going  anywhere; Processed lack of attention        reported doing well and noted being off work today, acknowledging the importance of prioritizing her own needs and having increased leave built up. Patient expressed ongoing difficulty celebrating her birthday due to longstanding negative associations stemming from the loss of her best friend days after her birthday several years ago.  Patient engaged in processing improvements in mood over recent weeks, correlating with progress made toward pursuing alternative employment opportunities. Patient acknowledged feeling emotionally better and recognized the continued need to focus on self-care and to increase social interaction and connection with others. Explored patient's irrational thoughts and cognitive distortions surrounding isolation and perceived comfort in avoiding socialization. Reviewed the cognitive triangle (situation-thoughts-feelings-behaviors) in relation to negative work related events and patterns of avoidance.  Patient further discussed stress and concerns related to not having been in a romantic relationship for over a decade. Explored barriers contributing to difficulty meeting potential partners beyond those seeking purely sexual relationships. Processed the importance of ongoing self-care, value alignment, and emotional readiness as foundational aspects that support healthier relational opportunities.     03/30/2024    2:43 PM 03/16/2024    8:57 AM 02/23/2024    8:17 AM 02/09/2024    8:50 AM  Wallace 7 : Generalized Anxiety Score  Nervous, Anxious, on Edge 1 2 3 1   Control/stop worrying 1 2 3 1   Worry too much - different things 0 2 1 1   Trouble relaxing 1 2 0 1  Restless 1 2 0 0  Easily annoyed or irritable 0 1 2 0  Afraid - awful might happen 1 1 3 1   Total Wallace 7 Score 5 12 12 5   Anxiety Difficulty Not difficult at all Somewhat difficult Not difficult at all Not difficult at all      03/30/2024    2:47 PM 03/16/2024    9:00  AM 02/23/2024    8:15 AM 02/09/2024    8:56 AM 01/15/2024    9:52 AM  Depression screen PHQ 2/9  Decreased Interest 0 1 0 0 1  Down, Depressed, Hopeless 0 1 0 0 0  PHQ - 2 Score 0 2 0 0 1  Altered sleeping 1 1 0 0 0  Tired, decreased energy 0 1 0 0 0  Change in appetite 0 1 0 0 0  Feeling bad or failure about yourself  1 1 1  0 0  Trouble concentrating 0 2 2 0 1  Moving slowly or fidgety/restless 0 1 0 0 0  Suicidal thoughts 1 1 0 0 0  PHQ-9 Score 3 10  3   0  2   Difficult doing work/chores Not difficult at all Somewhat difficult Not difficult at all  Somewhat difficult     Data saved with a previous flowsheet row definition    Suicidal/Homicidal: No; Without plan, intent, or means.   Therapist Response:  Clinician *** actively greeted patient upon joining visit, assessing presenting moods and affect, engaging in introductory check-in. Utilized open ended questions in eliciting recounts of events of the past week and presenting challenges. Utilized active listening techniques to support pt reflections of weekly events. Utilized CBT interventions in identifying and challenging distorted thoughts, reviewing cognitive triangles, and increasing insight into avoidance patterns; MI to enhance motivation for increasing social engagement and continued pursuit of meaningful life changes; supportive reflection to validate emotional experiences and reinforce strengths; psychoeducation on grief associations, social connection, and healthy relationship formation. Clinician reassessed severity of presenting sxs, and presence of any safety concerns.  [x]  Cognitive Challenging []  Cognitive Refocusing [x]  Cognitive Reframing [x]  Communication Skills []  Compliance Issues []  DBT [x]  Exploration of Coping Patterns [x]  Exploration of Emotions []  Exploration of Relationship Patterns []  Guided Imagery []  Interactive Feedback [x]  Interpersonal Resolutions []  Mindfulness Training []  Preventative Services [x]   Psycho-Education []  Relaxation/Deep Breathing []  Review of Treatment Plan/Progress [x]  Role-Play/Behavioral Rehearsal [x]  Structured Problem Solving [x]  Supportive Reflection [x]  Symptom Management []  Other  Patient responded well to interventions. Patient continues to meet criteria for Bipolar 2 and PTSD, and Anxiety. Patient will continue to benefit from engagement in outpatient therapy due to being the least restrictive service to meet presenting needs. Pt proves to maintain moderate progress towards tx goals.   Plan: Return again in 1 weeks.  Diagnosis:  Encounter Diagnoses  Name Primary?   Bipolar 2 disorder (HCC) Yes   Anxiety     Collaboration of Care: Psychiatrist AEB provider notes available in EHR.  Patient/Guardian was advised Release of Information must be obtained prior to any record release in order to collaborate their care with an outside provider. Patient/Guardian was advised if they have not already done so to contact the registration department to sign all necessary forms in order for us  to release information regarding their care.   Consent: Patient/Guardian gives verbal consent for treatment and assignment of benefits for services provided during this visit. Patient/Guardian expressed understanding and agreed to proceed.  Virtual Visit via Video Note  I connected with Nicole Wallace on 04/20/24 at  8:00 AM EST by a video enabled telemedicine application and verified that I am speaking with the correct person using two identifiers.  Location: Patient: Home Provider: Home Office   I discussed the limitations of evaluation and management by telemedicine and the availability of in person appointments. The patient expressed understanding and agreed to proceed.  I discussed the assessment and  treatment plan with the patient. The patient was provided an opportunity to ask questions and all were answered. The patient agreed with the plan and demonstrated an  understanding of the instructions.   The patient was advised to call back or seek an in-person evaluation if the symptoms worsen or if the condition fails to improve as anticipated.  I provided 44 minutes of non-face-to-face time during this encounter.  Lynwood JONETTA Maris, MSW, LCSW 04/20/2024,  8:23 AM

## 2024-04-27 ENCOUNTER — Ambulatory Visit (INDEPENDENT_AMBULATORY_CARE_PROVIDER_SITE_OTHER): Admitting: Licensed Clinical Social Worker

## 2024-04-27 DIAGNOSIS — F419 Anxiety disorder, unspecified: Secondary | ICD-10-CM

## 2024-04-27 DIAGNOSIS — F3181 Bipolar II disorder: Secondary | ICD-10-CM

## 2024-04-27 NOTE — Progress Notes (Signed)
 THERAPIST PROGRESS NOTE   Session Date: 04/27/2024  Session Time:  0805 - 9166  Participation Level: Active  Behavioral Response: Well GroomedAlertEuthymic  Type of Therapy: Individual Therapy  Treatment Goals addressed:   Progressing (3) LTG: Increase coping skills to manage depression and improve ability to perform daily activities (OP Depression) STG: Adelai will identify cognitive patterns and beliefs that support depression (OP Depression) LTG: Be able to talk through experiences without having an increased emotional response (OP Depression)  Initial (2) STG: Cash will demonstrate interest in social activities by initiating/joining social activity without prompts (Social Interpersonal Effectiveness) STG: Anjela will identify behaviors that engage in social isolation (Social Interpersonal Effectiveness)  Completed/Met (3) LTG: Reduce frequency, intensity, and duration of depression symptoms so that daily functioning is improved (OP Depression) LTG: Elimination of maladaptive behaviors and thinking patterns which interfere with resolution of trauma as evidenced by a reduction in avoidant behaviors. (BH CCP Acute or Chronic Trauma Reaction) LTG: Recall traumatic events without becoming overwhelmed with negative emotions (BH CCP Acute or Chronic Trauma Reaction)  ProgressTowards Goals: Progressing  Interventions: CBT, Motivational Interviewing, and Supportive  Summary: Kamica is a 43 y.o. female with past psych history of Bipolar 2, PTSD, and Anxiety, presenting for follow-up therapy session in efforts to improve management of presenting sxs.   Patient actively engaged in session, presenting with overall pleasant mood and congruent affect.  Pt actively engaged in check-in, sharing uncertainty of how long she may remain connected to visit today, detailing of continued frustrations surrounding current workplace, specifically detailing of current employer having mandated that  pt begin logging times of which she is making calls, essentially requiring a time log, processing feelings surrounding continued scrutiny in current workplace.  Further processed recent third interview with alternate employment opportunity, feeling a little uncertain surrounding the delays and feeling that process continues to be met with delays due to individuals being out and holidays further prolonging the process.  Engaged in reassessing presenting depressive and anxious sxs via PHQ-9 and GAD-7, noting of maintained downward trend.  Explored pt's need to maintain scheduled appts as scheduled, however potential need for shorter visits due to work obligations and micro-managing of time.     04/27/2024    8:25 AM 03/30/2024    2:43 PM 03/16/2024    8:57 AM 02/23/2024    8:17 AM  GAD 7 : Generalized Anxiety Score  Nervous, Anxious, on Edge 0 1 2 3   Control/stop worrying 0 1 2 3   Worry too much - different things 0 0 2 1  Trouble relaxing 0 1 2 0  Restless 1 1 2  0  Easily annoyed or irritable 0 0 1 2  Afraid - awful might happen 0 1 1 3   Total GAD 7 Score 1 5 12 12   Anxiety Difficulty Not difficult at all Not difficult at all Somewhat difficult Not difficult at all      04/27/2024    8:26 AM 03/30/2024    2:47 PM 03/16/2024    9:00 AM 02/23/2024    8:15 AM 02/09/2024    8:56 AM  Depression screen PHQ 2/9  Decreased Interest 0 0 1 0 0  Down, Depressed, Hopeless 0 0 1 0 0  PHQ - 2 Score 0 0 2 0 0  Altered sleeping 0 1 1 0 0  Tired, decreased energy 0 0 1 0 0  Change in appetite 0 0 1 0 0  Feeling bad or failure about yourself  0 1 1 1  0  Trouble concentrating 0 0 2 2 0  Moving slowly or fidgety/restless 0 0 1 0 0  Suicidal thoughts 0 1 1 0 0  PHQ-9 Score 0 3 10  3   0   Difficult doing work/chores  Not difficult at all Somewhat difficult Not difficult at all      Data saved with a previous flowsheet row definition    Suicidal/Homicidal: No; Without plan, intent, or means.    Therapist Response:  Clinician actively greeted patient upon joining visit, assessing presenting moods and affect, engaging in introductory check-in. Utilized open ended questions in eliciting recounts of events of the past week and presenting challenges, utilizing active listening techniques to support pt reflections of events. Utilized CBT (identifying cognitive interpretations, MI (readiness surrounding change in employment), supportive reflection, and psychoeducation. Clinician reassessed severity of presenting sxs, and presence of any safety concerns.  [x]  Cognitive Challenging []  Cognitive Refocusing [x]  Cognitive Reframing []  Communication Skills []  Compliance Issues []  DBT []  Exploration of Coping Patterns [x]  Exploration of Emotions []  Exploration of Relationship Patterns []  Guided Imagery []  Interactive Feedback [x]  Interpersonal Resolutions []  Mindfulness Training []  Preventative Services [x]  Psycho-Education []  Relaxation/Deep Breathing []  Review of Treatment Plan/Progress [x]  Role-Play/Behavioral Rehearsal [x]  Structured Problem Solving [x]  Supportive Reflection []  Symptom Management []  Other  Patient responded well to interventions. Patient continues to meet criteria for Bipolar 2 and PTSD, and Anxiety. Patient will continue to benefit from engagement in outpatient therapy due to being the least restrictive service to meet presenting needs. Pt proves to maintain moderate progress towards tx goals.   Plan: Return again in 1 weeks.  Diagnosis:  Encounter Diagnoses  Name Primary?   Bipolar 2 disorder (HCC) Yes   Anxiety      Collaboration of Care: Psychiatrist AEB provider notes available in EHR.  Patient/Guardian was advised Release of Information must be obtained prior to any record release in order to collaborate their care with an outside provider. Patient/Guardian was advised if they have not already done so to contact the registration department to sign all  necessary forms in order for us  to release information regarding their care.   Consent: Patient/Guardian gives verbal consent for treatment and assignment of benefits for services provided during this visit. Patient/Guardian expressed understanding and agreed to proceed.  Virtual Visit via Video Note  I connected with [place] on 04/27/2024 at  8:00 AM EST by a video enabled telemedicine application and verified that I am speaking with the correct person using two identifiers.  Location: Patient: Home Provider: Home Office   I discussed the limitations of evaluation and management by telemedicine and the availability of in person appointments. The patient expressed understanding and agreed to proceed.  I discussed the assessment and treatment plan with the patient. The patient was provided an opportunity to ask questions and all were answered. The patient agreed with the plan and demonstrated an understanding of the instructions.   The patient was advised to call back or seek an in-person evaluation if the symptoms worsen or if the condition fails to improve as anticipated.  I provided 28 minutes of non-face-to-face time during this encounter.  Lynwood JONETTA Maris, MSW, LCSW 04/27/2024,  8:30 AM

## 2024-05-03 ENCOUNTER — Ambulatory Visit (HOSPITAL_COMMUNITY): Admitting: Licensed Clinical Social Worker

## 2024-05-03 DIAGNOSIS — F419 Anxiety disorder, unspecified: Secondary | ICD-10-CM

## 2024-05-03 DIAGNOSIS — F3181 Bipolar II disorder: Secondary | ICD-10-CM

## 2024-05-03 NOTE — Progress Notes (Addendum)
 THERAPIST PROGRESS NOTE   Session Date: 05/03/2024  Session Time:  0810 - 0843  Participation Level: Active  Behavioral Response: Well GroomedAlertAnxious and Depressed  Type of Therapy: Individual Therapy  Treatment Goals addressed:   Progressing (3) LTG: Increase coping skills to manage depression and improve ability to perform daily activities (OP Depression) STG: Lacora will identify cognitive patterns and beliefs that support depression (OP Depression) LTG: Be able to talk through experiences without having an increased emotional response (OP Depression)  Initial (2) STG: Nicole Wallace will demonstrate interest in social activities by initiating/joining social activity without prompts (Social Interpersonal Effectiveness) STG: Nicole Wallace will identify behaviors that engage in social isolation (Social Interpersonal Effectiveness)  Completed/Met (3) LTG: Reduce frequency, intensity, and duration of depression symptoms so that daily functioning is improved (OP Depression) LTG: Elimination of maladaptive behaviors and thinking patterns which interfere with resolution of trauma as evidenced by a reduction in avoidant behaviors. (BH CCP Acute or Chronic Trauma Reaction) LTG: Recall traumatic events without becoming overwhelmed with negative emotions (BH CCP Acute or Chronic Trauma Reaction)  ProgressTowards Goals: Progressing  Interventions: CBT, Motivational Interviewing, and Supportive  Summary: Nicole Wallace is a 43 y.o. female with past psych history of Bipolar 2, PTSD, and Anxiety, presenting for follow-up therapy session in efforts to improve management of presenting sxs.   Patient actively engaged in session, presenting with overall depressed and anxious mood and congruent affect.  Pt actively engaged in check-in, sharing of having heard from two jobs, one being the one that pt had interviewed 3x with, learning of not being elected for role, and learning of being a continued candidate for  an alternate role previously applied for. Pt shared of having utilized feedback and continued dissatisfaction in current workplace as a means to motivate further for exploring alternate employment options and applying for potential roles that align with pt's preferences.  Processed continued dissatisfaction in current workplace, exploring transitions in roles, feeling of needing to compromise self by masking and inability to be authentic self in role, causing pt to further feel unhappy and exhausted with overall experience with current employer. Pt expressed feeling micro-managed, and every work related task being tracked and/or scrutinized AEB continuing to be required to track every task completed on behalf of company.      04/27/2024    8:25 AM 03/30/2024    2:43 PM 03/16/2024    8:57 AM 02/23/2024    8:17 AM  GAD 7 : Generalized Anxiety Score  Nervous, Anxious, on Edge 0 1 2 3   Control/stop worrying 0 1 2 3   Worry too much - different things 0 0 2 1  Trouble relaxing 0 1 2 0  Restless 1 1 2  0  Easily annoyed or irritable 0 0 1 2  Afraid - awful might happen 0 1 1 3   Total GAD 7 Score 1 5 12 12   Anxiety Difficulty Not difficult at all Not difficult at all Somewhat difficult Not difficult at all      04/27/2024    8:26 AM 03/30/2024    2:47 PM 03/16/2024    9:00 AM 02/23/2024    8:15 AM 02/09/2024    8:56 AM  Depression screen PHQ 2/9  Decreased Interest 0 0 1 0 0  Down, Depressed, Hopeless 0 0 1 0 0  PHQ - 2 Score 0 0 2 0 0  Altered sleeping 0 1 1 0 0  Tired, decreased energy 0 0 1 0 0  Change in appetite 0 0 1  0 0  Feeling bad or failure about yourself  0 1 1 1  0  Trouble concentrating 0 0 2 2 0  Moving slowly or fidgety/restless 0 0 1 0 0  Suicidal thoughts 0 1 1 0 0  PHQ-9 Score 0 3 10  3   0   Difficult doing work/chores  Not difficult at all Somewhat difficult Not difficult at all      Data saved with a previous flowsheet row definition    Suicidal/Homicidal: No;  Without plan, intent, or means.   Therapist Response:  Clinician actively greeted patient upon joining visit, assessing presenting moods and affect, engaging in introductory check-in. Utilized open ended questions in eliciting recounts of events of the past week and presenting challenges, utilizing active listening techniques to support pt reflections of events and ongoing challenges in relation to workplace stressors. Utilized CBT (identifying cognitive interpretations, MI (readiness surrounding change in employment), supportive reflection, and psychoeducation. Clinician reassessed severity of presenting sxs, and presence of any safety concerns.  [x]  Cognitive Challenging []  Cognitive Refocusing [x]  Cognitive Reframing []  Communication Skills []  Compliance Issues []  DBT []  Exploration of Coping Patterns [x]  Exploration of Emotions []  Exploration of Relationship Patterns []  Guided Imagery []  Interactive Feedback []  Interpersonal Resolutions []  Mindfulness Training []  Preventative Services [x]  Psycho-Education []  Relaxation/Deep Breathing []  Review of Treatment Plan/Progress []  Role-Play/Behavioral Rehearsal [x]  Structured Problem Solving [x]  Supportive Reflection []  Symptom Management []  Other  Patient responded well to interventions. Patient continues to meet criteria for Bipolar 2 and PTSD, and Anxiety. Patient will continue to benefit from engagement in outpatient therapy due to being the least restrictive service to meet presenting needs. Pt proves to maintain moderate progress towards tx goals.   Plan: Return again in 1 weeks.  Diagnosis:  Encounter Diagnoses  Name Primary?   Bipolar 2 disorder (HCC) Yes   Anxiety       Collaboration of Care: Psychiatrist AEB provider notes available in EHR.  Patient/Guardian was advised Release of Information must be obtained prior to any record release in order to collaborate their care with an outside provider. Patient/Guardian was advised if  they have not already done so to contact the registration department to sign all necessary forms in order for us  to release information regarding their care.   Consent: Patient/Guardian gives verbal consent for treatment and assignment of benefits for services provided during this visit. Patient/Guardian expressed understanding and agreed to proceed.  Virtual Visit via Video Note  I connected with Nicole Wallace on 05/03/2024 at  8:00 AM EST by a video enabled telemedicine application and verified that I am speaking with the correct person using two identifiers.  Location: Patient: Home Provider: Home Office   I discussed the limitations of evaluation and management by telemedicine and the availability of in person appointments. The patient expressed understanding and agreed to proceed.  I discussed the assessment and treatment plan with the patient. The patient was provided an opportunity to ask questions and all were answered. The patient agreed with the plan and demonstrated an understanding of the instructions.   The patient was advised to call back or seek an in-person evaluation if the symptoms worsen or if the condition fails to improve as anticipated.  I provided 33 minutes of non-face-to-face time during this encounter.  Lynwood JONETTA Maris, MSW, LCSW 05/03/2024,  8:11 AM

## 2024-05-10 ENCOUNTER — Ambulatory Visit (INDEPENDENT_AMBULATORY_CARE_PROVIDER_SITE_OTHER): Admitting: Licensed Clinical Social Worker

## 2024-05-10 DIAGNOSIS — F419 Anxiety disorder, unspecified: Secondary | ICD-10-CM | POA: Diagnosis not present

## 2024-05-10 DIAGNOSIS — F3181 Bipolar II disorder: Secondary | ICD-10-CM

## 2024-05-10 NOTE — Progress Notes (Signed)
 THERAPIST PROGRESS NOTE   Session Date: 05/10/2024  Session Time:  0805 - 0843  Participation Level: Active  Behavioral Response: Well GroomedAlertEuthymic  Type of Therapy: Individual Therapy  Treatment Goals addressed:   Progressing (3) LTG: Increase coping skills to manage depression and improve ability to perform daily activities (OP Depression) STG: Nicole Wallace will identify cognitive patterns and beliefs that support depression (OP Depression) LTG: Be able to talk through experiences without having an increased emotional response (OP Depression)  Initial (2) STG: Nicole Wallace will demonstrate interest in social activities by initiating/joining social activity without prompts (Social Interpersonal Effectiveness) STG: Nicole Wallace will identify behaviors that engage in social isolation (Social Interpersonal Effectiveness)  Completed/Met (3) LTG: Reduce frequency, intensity, and duration of depression symptoms so that daily functioning is improved (OP Depression) LTG: Elimination of maladaptive behaviors and thinking patterns which interfere with resolution of trauma as evidenced by a reduction in avoidant behaviors. (BH CCP Acute or Chronic Trauma Reaction) LTG: Recall traumatic events without becoming overwhelmed with negative emotions (BH CCP Acute or Chronic Trauma Reaction)  ProgressTowards Goals: Progressing  Interventions: CBT, Motivational Interviewing, and Supportive  Summary: Nicole Wallace is a 43 y.o. female with past psych history of Bipolar 2, PTSD, and Anxiety, presenting for follow-up therapy session in efforts to improve management of presenting sxs.   Patient actively engaged in session, presenting with overall pleasant mood and congruent affect.  Pt actively engaged in check-in, sharing of working today and tomorrow, being off Wednesday and Thursday, and returning Friday.   Pt shared of finding self coming to a point of peace with being where she is in relation to work,  detailing of having reflected over the past few days while off work and feeling she is not going to stress herself over what she is doing right now, expressing intent to still apply for jobs, but overall shift in perspective and belief that God will bless me in his time. Engaged in further reflection of sphere's of control, influence, and concern in relation to inabilities to force change, and focus on the things that are within her control. Further explored pt's faith and increase in acceptance of things that pt is unable to change and awareness of forcing things proving to lead to further negative outcomes. Reflected on cognitive triangle and the relationship between situations/stimuli, thoughts, feelings, and behaviors, processing negative thought patterns and implications of depressive cycle.  Pt briefly shared of having spent Christmas with father, noting of no distress over the holiday.     04/27/2024    8:25 AM 03/30/2024    2:43 PM 03/16/2024    8:57 AM 02/23/2024    8:17 AM  GAD 7 : Generalized Anxiety Score  Nervous, Anxious, on Edge 0 1 2 3   Control/stop worrying 0 1 2 3   Worry too much - different things 0 0 2 1  Trouble relaxing 0 1 2 0  Restless 1 1 2  0  Easily annoyed or irritable 0 0 1 2  Afraid - awful might happen 0 1 1 3   Total GAD 7 Score 1 5 12 12   Anxiety Difficulty Not difficult at all Not difficult at all Somewhat difficult Not difficult at all      04/27/2024    8:26 AM 03/30/2024    2:47 PM 03/16/2024    9:00 AM 02/23/2024    8:15 AM 02/09/2024    8:56 AM  Depression screen PHQ 2/9  Decreased Interest 0 0 1 0 0  Down, Depressed, Hopeless 0 0  1 0 0  PHQ - 2 Score 0 0 2 0 0  Altered sleeping 0 1 1 0 0  Tired, decreased energy 0 0 1 0 0  Change in appetite 0 0 1 0 0  Feeling bad or failure about yourself  0 1 1 1  0  Trouble concentrating 0 0 2 2 0  Moving slowly or fidgety/restless 0 0 1 0 0  Suicidal thoughts 0 1 1 0 0  PHQ-9 Score 0 3 10  3   0    Difficult doing work/chores  Not difficult at all Somewhat difficult Not difficult at all      Data saved with a previous flowsheet row definition    Suicidal/Homicidal: No; Without plan, intent, or means.   Therapist Response:  Clinician actively greeted patient, assessing presenting moods and affect, engaging in introductory check-in. Utilized open ended questions in eliciting recounts of events of the past week, utilizing active listening techniques to support pt reflections of events and ongoing challenges in relation to workplace stressors. Utilized CBT, MI, supportive reflection, and psychoeducation. Clinician reassessed severity of presenting sxs, and presence of any safety concerns.  [x]  Cognitive Challenging []  Cognitive Refocusing [x]  Cognitive Reframing []  Communication Skills []  Compliance Issues []  DBT []  Exploration of Coping Patterns [x]  Exploration of Emotions []  Exploration of Relationship Patterns []  Guided Imagery []  Interactive Feedback []  Interpersonal Resolutions []  Mindfulness Training []  Preventative Services [x]  Psycho-Education []  Relaxation/Deep Breathing []  Review of Treatment Plan/Progress []  Role-Play/Behavioral Rehearsal [x]  Structured Problem Solving [x]  Supportive Reflection []  Symptom Management []  Other  Patient responded well to interventions. Patient continues to meet criteria for Bipolar 2 and PTSD, and Anxiety. Patient will continue to benefit from engagement in outpatient therapy due to being the least restrictive service to meet presenting needs. Pt proves to maintain moderate progress towards tx goals.   Plan: Return again in 1 weeks.  Diagnosis:  Encounter Diagnoses  Name Primary?   Bipolar 2 disorder (HCC) Yes   Anxiety       Collaboration of Care: Psychiatrist AEB provider notes available in EHR.  Patient/Guardian was advised Release of Information must be obtained prior to any record release in order to collaborate their care with  an outside provider. Patient/Guardian was advised if they have not already done so to contact the registration department to sign all necessary forms in order for us  to release information regarding their care.   Consent: Patient/Guardian gives verbal consent for treatment and assignment of benefits for services provided during this visit. Patient/Guardian expressed understanding and agreed to proceed.  Virtual Visit via Video Note  I connected with Nicole Wallace on 05/10/2024 at  8:00 AM EST by a video enabled telemedicine application and verified that I am speaking with the correct person using two identifiers.  Location: Patient: Home Provider: Home Office   I discussed the limitations of evaluation and management by telemedicine and the availability of in person appointments. The patient expressed understanding and agreed to proceed.  I discussed the assessment and treatment plan with the patient. The patient was provided an opportunity to ask questions and all were answered. The patient agreed with the plan and demonstrated an understanding of the instructions.   The patient was advised to call back or seek an in-person evaluation if the symptoms worsen or if the condition fails to improve as anticipated.  I provided 38 minutes of non-face-to-face time during this encounter.  Nicole Wallace, MSW, LCSW 05/10/2024,  8:07 AM

## 2024-05-17 ENCOUNTER — Ambulatory Visit (INDEPENDENT_AMBULATORY_CARE_PROVIDER_SITE_OTHER): Admitting: Licensed Clinical Social Worker

## 2024-05-17 DIAGNOSIS — F3181 Bipolar II disorder: Secondary | ICD-10-CM | POA: Diagnosis not present

## 2024-05-17 DIAGNOSIS — F419 Anxiety disorder, unspecified: Secondary | ICD-10-CM | POA: Diagnosis not present

## 2024-05-17 NOTE — Progress Notes (Signed)
 THERAPIST PROGRESS NOTE   Session Date: 05/17/2024  Session Time:  0803 - 0843  Participation Level: Active  Behavioral Response: Well GroomedAlertEuthymic  Type of Therapy: Individual Therapy  Treatment Goals addressed:   Progressing (3) LTG: Increase coping skills to manage depression and improve ability to perform daily activities (OP Depression) STG: Nicole Wallace will identify cognitive patterns and beliefs that support depression (OP Depression) LTG: Be able to talk through experiences without having an increased emotional response (OP Depression)  Initial (2) STG: Nicole Wallace will demonstrate interest in social activities by initiating/joining social activity without prompts (Social Interpersonal Effectiveness) STG: Nicole Wallace will identify behaviors that engage in social isolation (Social Interpersonal Effectiveness)  Completed/Met (3) LTG: Reduce frequency, intensity, and duration of depression symptoms so that daily functioning is improved (OP Depression) LTG: Elimination of maladaptive behaviors and thinking patterns which interfere with resolution of trauma as evidenced by a reduction in avoidant behaviors. (BH CCP Acute or Chronic Trauma Reaction) LTG: Recall traumatic events without becoming overwhelmed with negative emotions (BH CCP Acute or Chronic Trauma Reaction)  ProgressTowards Goals: Progressing  Interventions: CBT, Motivational Interviewing, and Supportive  Summary: Nicole Wallace is a 44 y.o. female with past psych history of Bipolar 2, PTSD, and Anxiety, presenting for follow-up therapy session in efforts to improve management of presenting sxs.   Patient actively engaged in session, presenting with overall pleasant mood and congruent affect.  Pt actively engaged in check-in, reporting doing well overall and sharing that the long New Years weekend was fine. Pt described attending a community location for New Years activities but noted no longer finding enjoyment in social  settings such as bars, clubs, or lounges. Pt expressed a growing belief that there are more meaningful or fulfilling ways to spend personal time.  Session focused on extensive exploration of needs and behaviors, including how actions often align with underlying needs such as social connection, companionship, or stimulation. Pt engaged in processing how personal perspective, past experiences, and shifting values influence interests, preferences, and engagement in enjoyable activities. Also explored cultural elements contributing to the development of values and expectations--examining how cultural identity can vary across households, communities, regions, and broader social contexts, and how these layers impact behavior and self-perception.  Pt additionally provided brief updates on efforts toward securing alternate employment, sharing a sense of contentment with current circumstances and expressing a desire to not force change. Discussed differentiating between factors within vs. outside of the patients control and how this framing influences emotional regulation and decision-making.     04/27/2024    8:25 AM 03/30/2024    2:43 PM 03/16/2024    8:57 AM 02/23/2024    8:17 AM  GAD 7 : Generalized Anxiety Score  Nervous, Anxious, on Edge 0 1 2 3   Control/stop worrying 0 1 2 3   Worry too much - different things 0 0 2 1  Trouble relaxing 0 1 2 0  Restless 1 1 2  0  Easily annoyed or irritable 0 0 1 2  Afraid - awful might happen 0 1 1 3   Total GAD 7 Score 1 5 12 12   Anxiety Difficulty Not difficult at all Not difficult at all Somewhat difficult Not difficult at all      04/27/2024    8:26 AM 03/30/2024    2:47 PM 03/16/2024    9:00 AM 02/23/2024    8:15 AM 02/09/2024    8:56 AM  Depression screen PHQ 2/9  Decreased Interest 0 0 1 0 0  Down, Depressed, Hopeless 0  0 1 0 0  PHQ - 2 Score 0 0 2 0 0  Altered sleeping 0 1 1 0 0  Tired, decreased energy 0 0 1 0 0  Change in appetite 0 0 1 0 0   Feeling bad or failure about yourself  0 1 1 1  0  Trouble concentrating 0 0 2 2 0  Moving slowly or fidgety/restless 0 0 1 0 0  Suicidal thoughts 0 1 1 0 0  PHQ-9 Score 0 3 10  3   0   Difficult doing work/chores  Not difficult at all Somewhat difficult Not difficult at all      Data saved with a previous flowsheet row definition    Suicidal/Homicidal: No; Without plan, intent, or means.   Therapist Response:  Clinician actively greeted patient, assessing presenting moods and affect, engaging in introductory check-in. Utilized open ended questions in eliciting recounts of events of the past week, utilizing active listening techniques to support pt reflections of events and ongoing challenges in relation to workplace stressors. Utilized CBT in exploration of values, needs, and behavioral patterns, MI, and utilized supportive reflection around shifting interests, identity development, and cultural influences on behavior and preferences. Clinician reassessed severity of presenting sxs, and presence of any safety concerns.  [x]  Cognitive Challenging []  Cognitive Refocusing [x]  Cognitive Reframing []  Communication Skills []  Compliance Issues []  DBT []  Exploration of Coping Patterns [x]  Exploration of Emotions []  Exploration of Relationship Patterns []  Guided Imagery []  Interactive Feedback []  Interpersonal Resolutions []  Mindfulness Training []  Preventative Services [x]  Psycho-Education []  Relaxation/Deep Breathing []  Review of Treatment Plan/Progress []  Role-Play/Behavioral Rehearsal [x]  Structured Problem Solving [x]  Supportive Reflection []  Symptom Management []  Other  Patient responded well to interventions. Patient continues to meet criteria for Bipolar 2 and PTSD, and Anxiety. Patient will continue to benefit from engagement in outpatient therapy due to being the least restrictive service to meet presenting needs. Pt proves to maintain moderate progress towards tx goals.   Plan: Return  again in 2 weeks.  Diagnosis:  Encounter Diagnoses  Name Primary?   Bipolar 2 disorder (HCC) Yes   Anxiety        Collaboration of Care: Psychiatrist AEB provider notes available in EHR.  Patient/Guardian was advised Release of Information must be obtained prior to any record release in order to collaborate their care with an outside provider. Patient/Guardian was advised if they have not already done so to contact the registration department to sign all necessary forms in order for us  to release information regarding their care.   Consent: Patient/Guardian gives verbal consent for treatment and assignment of benefits for services provided during this visit. Patient/Guardian expressed understanding and agreed to proceed.  Virtual Visit via Video Note  I connected with Nicole Wallace on 05/17/2024 at  8:00 AM EST by a video enabled telemedicine application and verified that I am speaking with the correct person using two identifiers.  Location: Patient: Home Provider: Home Office   I discussed the limitations of evaluation and management by telemedicine and the availability of in person appointments. The patient expressed understanding and agreed to proceed.  I discussed the assessment and treatment plan with the patient. The patient was provided an opportunity to ask questions and all were answered. The patient agreed with the plan and demonstrated an understanding of the instructions.   The patient was advised to call back or seek an in-person evaluation if the symptoms worsen or if the condition fails to improve as anticipated.  I  provided 40 minutes of non-face-to-face time during this encounter.  [facility] JONETTA Maris, MSW, LCSW 05/17/2024,  8:04 AM

## 2024-05-21 ENCOUNTER — Telehealth (HOSPITAL_COMMUNITY): Admitting: Psychiatry

## 2024-05-21 ENCOUNTER — Encounter (HOSPITAL_COMMUNITY): Payer: Self-pay | Admitting: Psychiatry

## 2024-05-21 VITALS — Wt 170.0 lb

## 2024-05-21 DIAGNOSIS — F3181 Bipolar II disorder: Secondary | ICD-10-CM

## 2024-05-21 DIAGNOSIS — F4310 Post-traumatic stress disorder, unspecified: Secondary | ICD-10-CM

## 2024-05-21 DIAGNOSIS — F419 Anxiety disorder, unspecified: Secondary | ICD-10-CM | POA: Diagnosis not present

## 2024-05-21 MED ORDER — BUPROPION HCL ER (XL) 150 MG PO TB24: 150.0000 mg | ORAL_TABLET | Freq: Every day | ORAL | 0 refills | Status: AC

## 2024-05-21 MED ORDER — HYDROXYZINE PAMOATE 25 MG PO CAPS: ORAL_CAPSULE | ORAL | 0 refills | Status: AC

## 2024-05-21 MED ORDER — TOPIRAMATE 50 MG PO TABS: 50.0000 mg | ORAL_TABLET | Freq: Every day | ORAL | 0 refills | Status: AC

## 2024-05-21 MED ORDER — BREXPIPRAZOLE 0.25 MG PO TABS: 0.2500 mg | ORAL_TABLET | Freq: Every day | ORAL | 2 refills | Status: AC
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
## 2024-05-26 ENCOUNTER — Ambulatory Visit (INDEPENDENT_AMBULATORY_CARE_PROVIDER_SITE_OTHER): Admitting: Licensed Clinical Social Worker

## 2024-05-26 DIAGNOSIS — F419 Anxiety disorder, unspecified: Secondary | ICD-10-CM | POA: Diagnosis not present

## 2024-05-26 DIAGNOSIS — F3181 Bipolar II disorder: Secondary | ICD-10-CM

## 2024-05-26 NOTE — Progress Notes (Signed)
 THERAPIST PROGRESS NOTE   Session Date: 05/26/2024  Session Time:  0805 - 0913  Participation Level: Active  Behavioral Response: Well GroomedAlertDepressed, Euthymic, and tearful  Type of Therapy: Individual Therapy  Treatment Goals addressed:   Progressing (3) LTG: Increase coping skills to manage depression and improve ability to perform daily activities (OP Depression) STG: Cinzia will identify cognitive patterns and beliefs that support depression (OP Depression) LTG: Be able to talk through experiences without having an increased emotional response (OP Depression)  Initial (2) STG: [place]lor will demonstrate interest in social activities by initiating/joining social activity without prompts (Social Interpersonal Effectiveness) STG: Lasundra will identify behaviors that engage in social isolation (Social Interpersonal Effectiveness)  Completed/Met (3) LTG: Reduce frequency, intensity, and duration of depression symptoms so that daily functioning is improved (OP Depression) LTG: Elimination of maladaptive behaviors and thinking patterns which interfere with resolution of trauma as evidenced by a reduction in avoidant behaviors. (BH CCP Acute or Chronic Trauma Reaction) LTG: Recall traumatic events without becoming overwhelmed with negative emotions (BH CCP Acute or Chronic Trauma Reaction)  ProgressTowards Goals: Progressing  Interventions: CBT, Motivational Interviewing, and Supportive  Summary: Keily is a 44 y.o. female with past psych history of Bipolar 2, PTSD, and Anxiety, presenting for follow-up therapy session in efforts to improve management of presenting sxs.   Patient actively engaged in session, presenting with overall depressed yet pleasant mood, congruent affect, and moments of tearfulness. During check-in, pt reported ongoing difficulty with motivation across recent weeks and engaged in detailed processing of recent medication management visit with Arfeen, MD. Pt  expressed understanding of medication benefits in reducing manic symptoms, while sharing concerns that recent adjustments may be contributing to decreased motivation. Pt reflected on previously noted improvements from [employer] --including increased focus, task completion, and reduced brain fog--while contrasting these with current challenges initiating activities, including exercise, cleaning home, etc.  Pt became tearful while processing fears of losing youthfulness, concerns about aging, and the impact of these thoughts on self-concept and romantic prospects. Explored a range of environmental contributors to mood decline, including social withdrawal, reliance on delivery services, home-based exercise, and significant financial strain linked to employment transitions over the past year. Pt compared current circumstances to last year, expressing belief that prior circumstances were better, and explored how comparing self to past experiences or others circumstances distorts perceptions and reduces connection to the present moment.  Pt engaged in substantial cognitive exploration of distorted thought patterns, acknowledging how limited or untested evidence contributed to negative interpretations that fuel depressed mood, emotional distress, and withdrawal behaviors. Pt acknowledged personal actions and avoidance patterns that reinforce depressive symptoms and expressed openness to exploring alternative coping approaches.     04/27/2024    8:25 AM 03/30/2024    2:43 PM 03/16/2024    8:57 AM 02/23/2024    8:17 AM  GAD 7 : Generalized Anxiety Score  Nervous, Anxious, on Edge 0 1 2 3   Control/stop worrying 0 1 2 3   Worry too much - different things 0 0 2 1  Trouble relaxing 0 1 2 0  Restless 1 1 2  0  Easily annoyed or irritable 0 0 1 2  Afraid - awful might happen 0 1 1 3   Total GAD 7 Score 1 5 12 12   Anxiety Difficulty Not difficult at all Not difficult at all Somewhat difficult Not difficult at  all      04/27/2024    8:26 AM 03/30/2024    2:47 PM 03/16/2024  9:00 AM 02/23/2024    8:15 AM 02/09/2024    8:56 AM  Depression screen PHQ 2/9  Decreased Interest 0 0 1 0 0  Down, Depressed, Hopeless 0 0 1 0 0  PHQ - 2 Score 0 0 2 0 0  Altered sleeping 0 1 1 0 0  Tired, decreased energy 0 0 1 0 0  Change in appetite 0 0 1 0 0  Feeling bad or failure about yourself  0 1 1 1  0  Trouble concentrating 0 0 2 2 0  Moving slowly or fidgety/restless 0 0 1 0 0  Suicidal thoughts 0 1 1 0 0  PHQ-9 Score 0 3 10  3   0   Difficult doing work/chores  Not difficult at all Somewhat difficult Not difficult at all      Data saved with a previous flowsheet row definition    Suicidal/Homicidal: No; Without plan, intent, or means.   Therapist Response:  Clinician actively greeted patient, assessing presenting moods and affect, engaging in introductory check-in. Utilized open ended questions in eliciting recounts of events of the past week, utilizing active listening techniques to support pt reflections of events and ongoing challenges. Therapist utilized CBT to challenge cognitive distortions and increase awareness of thought-emotion-behavior cycles; MI strategies to enhance motivation to re-engage in valued activities; and psychoeducation regarding environmental factors, behavioral activation, and mood regulation. Therapist validated pts emotional experience, reinforced insight gained, and encouraged gradual re-engagement in manageable, mood-supportive behaviors. Clinician reassessed severity of presenting sxs, and presence of any safety concerns. Patient responded well to interventions. Patient continues to meet criteria for Bipolar 2 and PTSD, and Anxiety. Patient will continue to benefit from engagement in outpatient therapy due to being the least restrictive service to meet presenting needs. Pt proves to maintain moderate progress towards tx goals.  [x]  Cognitive Challenging []  Cognitive  Refocusing [x]  Cognitive Reframing []  Communication Skills []  Compliance Issues []  DBT []  Exploration of Coping Patterns [x]  Exploration of Emotions []  Exploration of Relationship Patterns []  Guided Imagery []  Interactive Feedback []  Interpersonal Resolutions []  Mindfulness Training []  Preventative Services [x]  Psycho-Education []  Relaxation/Deep Breathing []  Review of Treatment Plan/Progress []  Role-Play/Behavioral Rehearsal [x]  Structured Problem Solving [x]  Supportive Reflection []  Symptom Management []  Other    Plan: Return again in 2 weeks.  Diagnosis:  Encounter Diagnoses  Name Primary?   Bipolar 2 disorder (HCC) Yes   Anxiety     Collaboration of Care: Psychiatrist AEB provider notes available in EHR.  Patient/Guardian was advised Release of Information must be obtained prior to any record release in order to collaborate their care with an outside provider. Patient/Guardian was advised if they have not already done so to contact the registration department to sign all necessary forms in order for us  to release information regarding their care.   Consent: Patient/Guardian gives verbal consent for treatment and assignment of benefits for services provided during this visit. Patient/Guardian expressed understanding and agreed to proceed.  Virtual Visit via Video Note  I connected with Miguel Weitman on 05/26/2024 at  8:00 AM EST by a video enabled telemedicine application and verified that I am speaking with the correct person using two identifiers.  Location: Patient: Home Provider: [facility]   I discussed the limitations of evaluation and management by telemedicine and the availability of in person appointments. The patient expressed understanding and agreed to proceed.  I discussed the assessment and treatment plan with the patient. The patient was provided an opportunity to ask questions  and all were answered. The patient agreed with the plan and demonstrated an  understanding of the instructions.   The patient was advised to call back or seek an in-person evaluation if the symptoms worsen or if the condition fails to improve as anticipated.  I provided 62 minutes of non-face-to-face time during this encounter.  Lynwood JONETTA Maris, MSW, LCSW 05/26/2024,  8:13 AM

## 2024-06-10 ENCOUNTER — Ambulatory Visit (HOSPITAL_COMMUNITY): Admitting: Licensed Clinical Social Worker

## 2024-06-10 DIAGNOSIS — F3181 Bipolar II disorder: Secondary | ICD-10-CM | POA: Diagnosis not present

## 2024-06-10 DIAGNOSIS — F419 Anxiety disorder, unspecified: Secondary | ICD-10-CM

## 2024-06-10 NOTE — Progress Notes (Signed)
 THERAPIST PROGRESS NOTE   Session Date: 06/10/2024  Session Time:  0810 - 0900  Participation Level: Active  Behavioral Response: Well GroomedAlertEuthymic  Type of Therapy: Individual Therapy  Treatment Goals addressed:   Progressing (3) LTG: Increase coping skills to manage depression and improve ability to perform daily activities (OP Depression) STG: Nicole Wallace will identify cognitive patterns and beliefs that support depression (OP Depression) LTG: Be able to talk through experiences without having an increased emotional response (OP Depression)  Initial (2) STG: Nicole Wallace will demonstrate interest in social activities by initiating/joining social activity without prompts (Social Interpersonal Effectiveness) STG: Nicole Wallace will identify behaviors that engage in social isolation (Social Interpersonal Effectiveness)  Completed/Met (3) LTG: Reduce frequency, intensity, and duration of depression symptoms so that daily functioning is improved (OP Depression) LTG: Elimination of maladaptive behaviors and thinking patterns which interfere with resolution of trauma as evidenced by a reduction in avoidant behaviors. (BH CCP Acute or Chronic Trauma Reaction) LTG: Recall traumatic events without becoming overwhelmed with negative emotions (BH CCP Acute or Chronic Trauma Reaction)  ProgressTowards Goals: Progressing  Interventions: CBT, Motivational Interviewing, and Supportive  Summary: Nicole Wallace is a 44 y.o. female with past psych history of Bipolar 2, PTSD, and Anxiety, presenting for follow-up therapy session in efforts to improve management of presenting sxs.   Patient was actively engaged in session, presenting with a pleasant mood and congruent affect. She reported doing well overall and noted minimal impact from the recent winter weather due to her [place] location being outside the core storm area. Early in session, patient briefly tended to her dogs after they became distracted by  clinicians voice, segueing into discussion about how working from home for the past six months has caused her dogs to become highly attached and distressed when she leaves the home.  Session progressed into a review of previously identified risk factors surrounding isolation and reduced time outside the home, including the correlation between limited social engagement and worsening depressive symptoms. Patient noted she has recently increased social interaction, including spending time with a female friend she is romantically involved with. She described positive impressions of the relationship, identifying emotional support, healthier dynamics, and notable differences from her prior relationship. Patient reported continued benefit from Wellbutrin , citing mood stabilization, reduced emotional reactivity, improved frustration tolerance, and less distress related to everyday stressors and ongoing work challenges.     04/27/2024    8:25 AM 03/30/2024    2:43 PM 03/16/2024    8:57 AM 02/23/2024    8:17 AM  GAD 7 : Generalized Anxiety Score  Nervous, Anxious, on Edge 0  1  2  3    Control/stop worrying 0  1  2  3    Worry too much - different things 0  0  2  1   Trouble relaxing 0  1  2  0   Restless 1  1  2   0   Easily annoyed or irritable 0  0  1  2   Afraid - awful might happen 0  1  1  3    Total GAD 7 Score 1 5 12 12   Anxiety Difficulty Not difficult at all Not difficult at all Somewhat difficult Not difficult at all     Data saved with a previous flowsheet row definition      04/27/2024    8:26 AM 03/30/2024    2:47 PM 03/16/2024    9:00 AM 02/23/2024    8:15 AM 02/09/2024    8:56 AM  Depression screen PHQ 2/9  Decreased Interest 0 0 1 0 0  Down, Depressed, Hopeless 0 0 1 0 0  PHQ - 2 Score 0 0 2 0 0  Altered sleeping 0 1 1 0 0  Tired, decreased energy 0 0 1 0 0  Change in appetite 0 0 1 0 0  Feeling bad or failure about yourself  0 1 1 1  0  Trouble concentrating 0 0 2 2 0  Moving  slowly or fidgety/restless 0 0 1 0 0  Suicidal thoughts 0 1 1 0 0  PHQ-9 Score 0 3 10  3   0   Difficult doing work/chores  Not difficult at all Somewhat difficult Not difficult at all      Data saved with a previous flowsheet row definition    Suicidal/Homicidal: No; Without plan, intent, or means.   Therapist Response:  Clinician actively greeted patient, assessing presenting moods and affect, engaging in introductory check-in. Utilized open ended questions in eliciting recounts of events of the past week, utilizing active listening techniques to support pt reflections of events and ongoing challenges. Clinician provided CBT-informed exploration of isolation patterns, reinforcing behavioral activation and increased social engagement. Supported patient in examining relational dynamics, identifying healthy supports, and clarifying emotional needs within the new relationship. Clinician also reviewed medication adherence benefits and encouraged continued monitoring of mood stabilization. Clinician reassessed severity of presenting sxs, and presence of any safety concerns. Patient responded well to interventions. Patient continues to meet criteria for Bipolar 2 and PTSD, and Anxiety. Patient will continue to benefit from engagement in outpatient therapy due to being the least restrictive service to meet presenting needs. Pt proves to maintain moderate progress towards tx goals.  [x]  Cognitive Challenging []  Cognitive Refocusing [x]  Cognitive Reframing []  Communication Skills []  Compliance Issues []  DBT [x]  Exploration of Coping Patterns [x]  Exploration of Emotions [x]  Exploration of Relationship Patterns []  Guided Imagery []  Interactive Feedback []  Interpersonal Resolutions []  Mindfulness Training []  Preventative Services [x]  Psycho-Education []  Relaxation/Deep Breathing []  Review of Treatment Plan/Progress []  Role-Play/Behavioral Rehearsal []  Structured Problem Solving [x]  Supportive Reflection []   Symptom Management []  Other    Plan: Return again in 2 weeks.  Diagnosis:  Encounter Diagnoses  Name Primary?   Bipolar 2 disorder (HCC) Yes   Anxiety     Collaboration of Care: Psychiatrist AEB provider notes available in EHR.  Patient/Guardian was advised Release of Information must be obtained prior to any record release in order to collaborate their care with an outside provider. Patient/Guardian was advised if they have not already done so to contact the registration department to sign all necessary forms in order for us  to release information regarding their care.   Consent: Patient/Guardian gives verbal consent for treatment and assignment of benefits for services provided during this visit. Patient/Guardian expressed understanding and agreed to proceed.  Virtual Visit via Video Note  I connected with Nicole Wallace on 06/10/24 at  8:00 AM EST by a video enabled telemedicine application and verified that I am speaking with the correct person using two identifiers.  Location: Patient: Home Provider: Home Office   I discussed the limitations of evaluation and management by telemedicine and the availability of in person appointments. The patient expressed understanding and agreed to proceed.  I discussed the assessment and treatment plan with the patient. The patient was provided an opportunity to ask questions and all were answered. The patient agreed with the plan and demonstrated an understanding of the instructions.   The  patient was advised to call back or seek an in-person evaluation if the symptoms worsen or if the condition fails to improve as anticipated.  I provided 50 minutes of non-face-to-face time during this encounter.  Lynwood JONETTA Maris, MSW, LCSW 06/10/2024,  8:13 AM

## 2024-06-14 ENCOUNTER — Ambulatory Visit (HOSPITAL_COMMUNITY): Admitting: Licensed Clinical Social Worker

## 2024-06-15 ENCOUNTER — Ambulatory Visit (INDEPENDENT_AMBULATORY_CARE_PROVIDER_SITE_OTHER): Admitting: Licensed Clinical Social Worker

## 2024-06-15 DIAGNOSIS — F419 Anxiety disorder, unspecified: Secondary | ICD-10-CM

## 2024-06-15 DIAGNOSIS — F3181 Bipolar II disorder: Secondary | ICD-10-CM

## 2024-06-15 DIAGNOSIS — F4310 Post-traumatic stress disorder, unspecified: Secondary | ICD-10-CM

## 2024-06-21 ENCOUNTER — Ambulatory Visit (HOSPITAL_COMMUNITY): Admitting: Licensed Clinical Social Worker

## 2024-06-22 ENCOUNTER — Ambulatory Visit (HOSPITAL_COMMUNITY): Admitting: Licensed Clinical Social Worker

## 2024-06-28 ENCOUNTER — Ambulatory Visit (HOSPITAL_COMMUNITY): Admitting: Licensed Clinical Social Worker

## 2024-07-05 ENCOUNTER — Ambulatory Visit (HOSPITAL_COMMUNITY): Admitting: Licensed Clinical Social Worker

## 2024-07-12 ENCOUNTER — Ambulatory Visit (HOSPITAL_COMMUNITY): Admitting: Licensed Clinical Social Worker

## 2024-07-19 ENCOUNTER — Ambulatory Visit (HOSPITAL_COMMUNITY): Admitting: Licensed Clinical Social Worker

## 2024-07-26 ENCOUNTER — Ambulatory Visit (HOSPITAL_COMMUNITY): Admitting: Licensed Clinical Social Worker

## 2024-08-02 ENCOUNTER — Ambulatory Visit (HOSPITAL_COMMUNITY): Admitting: Licensed Clinical Social Worker

## 2024-08-09 ENCOUNTER — Ambulatory Visit (HOSPITAL_COMMUNITY): Admitting: Licensed Clinical Social Worker

## 2024-08-20 ENCOUNTER — Telehealth (HOSPITAL_COMMUNITY): Admitting: Psychiatry
